# Patient Record
Sex: Female | Born: 1945 | Race: Black or African American | Hispanic: No | Smoking: Former smoker
Health system: Southern US, Community
[De-identification: ages and names within clinical notes are randomized; demographics above are authoritative.]

## PROBLEM LIST (undated history)

## (undated) DIAGNOSIS — M1990 Unspecified osteoarthritis, unspecified site: Secondary | ICD-10-CM

## (undated) DIAGNOSIS — I10 Essential (primary) hypertension: Secondary | ICD-10-CM

## (undated) DIAGNOSIS — H409 Unspecified glaucoma: Secondary | ICD-10-CM

## (undated) DIAGNOSIS — F32A Depression, unspecified: Secondary | ICD-10-CM

## (undated) DIAGNOSIS — G4730 Sleep apnea, unspecified: Secondary | ICD-10-CM

## (undated) DIAGNOSIS — H269 Unspecified cataract: Secondary | ICD-10-CM

## (undated) DIAGNOSIS — K219 Gastro-esophageal reflux disease without esophagitis: Secondary | ICD-10-CM

## (undated) DIAGNOSIS — E785 Hyperlipidemia, unspecified: Secondary | ICD-10-CM

## (undated) DIAGNOSIS — Z5189 Encounter for other specified aftercare: Secondary | ICD-10-CM

## (undated) DIAGNOSIS — N183 Chronic kidney disease, stage 3 (moderate): Secondary | ICD-10-CM

## (undated) DIAGNOSIS — R011 Cardiac murmur, unspecified: Secondary | ICD-10-CM

## (undated) HISTORY — DX: Chronic kidney disease, stage 3 (moderate): N18.3

## (undated) HISTORY — DX: Unspecified cataract: H26.9

## (undated) HISTORY — DX: Unspecified glaucoma: H40.9

## (undated) HISTORY — DX: Depression, unspecified: F32.A

## (undated) HISTORY — DX: Encounter for other specified aftercare: Z51.89

## (undated) HISTORY — DX: Unspecified osteoarthritis, unspecified site: M19.90

## (undated) HISTORY — DX: Cardiac murmur, unspecified: R01.1

## (undated) HISTORY — PX: POLYPECTOMY: SHX149

## (undated) HISTORY — PX: COLONOSCOPY: SHX174

## (undated) HISTORY — PX: CATARACT EXTRACTION, BILATERAL: SHX1313

## (undated) HISTORY — DX: Hyperlipidemia, unspecified: E78.5

## (undated) HISTORY — DX: Gastro-esophageal reflux disease without esophagitis: K21.9

## (undated) HISTORY — PX: ABDOMINAL HYSTERECTOMY: SHX81

---

## 1997-05-23 ENCOUNTER — Encounter (INDEPENDENT_AMBULATORY_CARE_PROVIDER_SITE_OTHER): Payer: Self-pay | Admitting: *Deleted

## 1998-02-06 ENCOUNTER — Encounter: Admission: RE | Admit: 1998-02-06 | Discharge: 1998-02-06 | Payer: Self-pay | Admitting: Family Medicine

## 1998-02-09 ENCOUNTER — Ambulatory Visit: Admission: RE | Admit: 1998-02-09 | Discharge: 1998-02-09 | Payer: Self-pay

## 1998-04-11 ENCOUNTER — Encounter: Admission: RE | Admit: 1998-04-11 | Discharge: 1998-04-11 | Payer: Self-pay | Admitting: Family Medicine

## 1998-05-16 ENCOUNTER — Encounter: Admission: RE | Admit: 1998-05-16 | Discharge: 1998-05-16 | Payer: Self-pay | Admitting: Family Medicine

## 1998-06-13 ENCOUNTER — Encounter: Admission: RE | Admit: 1998-06-13 | Discharge: 1998-06-13 | Payer: Self-pay | Admitting: Family Medicine

## 1998-07-09 ENCOUNTER — Encounter: Admission: RE | Admit: 1998-07-09 | Discharge: 1998-07-09 | Payer: Self-pay | Admitting: Sports Medicine

## 1998-07-09 ENCOUNTER — Ambulatory Visit (HOSPITAL_COMMUNITY): Admission: RE | Admit: 1998-07-09 | Discharge: 1998-07-09 | Payer: Self-pay | Admitting: Sports Medicine

## 1998-10-15 ENCOUNTER — Encounter: Admission: RE | Admit: 1998-10-15 | Discharge: 1998-10-15 | Payer: Self-pay | Admitting: Family Medicine

## 1998-11-21 ENCOUNTER — Encounter: Admission: RE | Admit: 1998-11-21 | Discharge: 1998-11-21 | Payer: Self-pay | Admitting: Family Medicine

## 1998-11-23 ENCOUNTER — Encounter: Admission: RE | Admit: 1998-11-23 | Discharge: 1998-11-23 | Payer: Self-pay | Admitting: Family Medicine

## 1999-03-27 ENCOUNTER — Encounter: Admission: RE | Admit: 1999-03-27 | Discharge: 1999-03-27 | Payer: Self-pay | Admitting: Family Medicine

## 1999-04-03 ENCOUNTER — Encounter: Admission: RE | Admit: 1999-04-03 | Discharge: 1999-04-03 | Payer: Self-pay | Admitting: Family Medicine

## 1999-06-24 ENCOUNTER — Encounter: Admission: RE | Admit: 1999-06-24 | Discharge: 1999-06-24 | Payer: Self-pay | Admitting: Family Medicine

## 1999-08-01 ENCOUNTER — Encounter: Admission: RE | Admit: 1999-08-01 | Discharge: 1999-08-01 | Payer: Self-pay | Admitting: Sports Medicine

## 1999-08-01 ENCOUNTER — Encounter: Payer: Self-pay | Admitting: Sports Medicine

## 1999-10-30 ENCOUNTER — Encounter: Admission: RE | Admit: 1999-10-30 | Discharge: 1999-10-30 | Payer: Self-pay | Admitting: Family Medicine

## 1999-12-04 ENCOUNTER — Encounter: Admission: RE | Admit: 1999-12-04 | Discharge: 1999-12-04 | Payer: Self-pay | Admitting: *Deleted

## 1999-12-04 ENCOUNTER — Encounter: Payer: Self-pay | Admitting: *Deleted

## 1999-12-16 ENCOUNTER — Encounter: Admission: RE | Admit: 1999-12-16 | Discharge: 1999-12-16 | Payer: Self-pay | Admitting: Family Medicine

## 1999-12-23 ENCOUNTER — Encounter: Admission: RE | Admit: 1999-12-23 | Discharge: 2000-03-22 | Payer: Self-pay | Admitting: *Deleted

## 2000-01-27 ENCOUNTER — Encounter (INDEPENDENT_AMBULATORY_CARE_PROVIDER_SITE_OTHER): Payer: Self-pay | Admitting: *Deleted

## 2000-01-27 ENCOUNTER — Ambulatory Visit (HOSPITAL_COMMUNITY): Admission: RE | Admit: 2000-01-27 | Discharge: 2000-01-27 | Payer: Self-pay | Admitting: Gastroenterology

## 2000-03-23 ENCOUNTER — Encounter: Admission: RE | Admit: 2000-03-23 | Discharge: 2000-03-23 | Payer: Self-pay | Admitting: Family Medicine

## 2000-03-23 ENCOUNTER — Ambulatory Visit (HOSPITAL_COMMUNITY): Admission: RE | Admit: 2000-03-23 | Discharge: 2000-03-23 | Payer: Self-pay | Admitting: Family Medicine

## 2000-03-27 ENCOUNTER — Encounter: Payer: Self-pay | Admitting: *Deleted

## 2000-03-27 ENCOUNTER — Ambulatory Visit (HOSPITAL_COMMUNITY): Admission: RE | Admit: 2000-03-27 | Discharge: 2000-03-27 | Payer: Self-pay | Admitting: *Deleted

## 2000-04-03 ENCOUNTER — Encounter: Admission: RE | Admit: 2000-04-03 | Discharge: 2000-04-03 | Payer: Self-pay | Admitting: Internal Medicine

## 2000-04-03 ENCOUNTER — Encounter: Payer: Self-pay | Admitting: Internal Medicine

## 2000-06-11 ENCOUNTER — Encounter: Admission: RE | Admit: 2000-06-11 | Discharge: 2000-06-11 | Payer: Self-pay | Admitting: Family Medicine

## 2000-08-21 ENCOUNTER — Encounter: Admission: RE | Admit: 2000-08-21 | Discharge: 2000-08-21 | Payer: Self-pay | Admitting: Family Medicine

## 2000-10-07 ENCOUNTER — Encounter: Payer: Self-pay | Admitting: Sports Medicine

## 2000-10-07 ENCOUNTER — Encounter: Admission: RE | Admit: 2000-10-07 | Discharge: 2000-10-07 | Payer: Self-pay | Admitting: Sports Medicine

## 2000-11-27 ENCOUNTER — Encounter: Admission: RE | Admit: 2000-11-27 | Discharge: 2000-11-27 | Payer: Self-pay | Admitting: Family Medicine

## 2001-03-26 ENCOUNTER — Encounter: Admission: RE | Admit: 2001-03-26 | Discharge: 2001-03-26 | Payer: Self-pay | Admitting: Family Medicine

## 2001-05-26 ENCOUNTER — Encounter: Admission: RE | Admit: 2001-05-26 | Discharge: 2001-05-26 | Payer: Self-pay | Admitting: Family Medicine

## 2001-10-04 ENCOUNTER — Encounter: Admission: RE | Admit: 2001-10-04 | Discharge: 2001-10-04 | Payer: Self-pay | Admitting: Sports Medicine

## 2001-12-08 ENCOUNTER — Emergency Department (HOSPITAL_COMMUNITY): Admission: EM | Admit: 2001-12-08 | Discharge: 2001-12-08 | Payer: Self-pay | Admitting: *Deleted

## 2001-12-21 ENCOUNTER — Encounter: Admission: RE | Admit: 2001-12-21 | Discharge: 2001-12-24 | Payer: Self-pay | Admitting: Orthopedic Surgery

## 2002-06-22 ENCOUNTER — Encounter (HOSPITAL_BASED_OUTPATIENT_CLINIC_OR_DEPARTMENT_OTHER): Admission: RE | Admit: 2002-06-22 | Discharge: 2002-09-20 | Payer: Self-pay | Admitting: Internal Medicine

## 2002-07-07 ENCOUNTER — Encounter: Admission: RE | Admit: 2002-07-07 | Discharge: 2002-07-07 | Payer: Self-pay | Admitting: Family Medicine

## 2002-09-26 ENCOUNTER — Encounter (HOSPITAL_BASED_OUTPATIENT_CLINIC_OR_DEPARTMENT_OTHER): Admission: RE | Admit: 2002-09-26 | Discharge: 2002-12-25 | Payer: Self-pay | Admitting: Internal Medicine

## 2002-10-06 ENCOUNTER — Encounter: Admission: RE | Admit: 2002-10-06 | Discharge: 2002-10-06 | Payer: Self-pay | Admitting: Family Medicine

## 2002-12-22 ENCOUNTER — Encounter: Admission: RE | Admit: 2002-12-22 | Discharge: 2002-12-22 | Payer: Self-pay | Admitting: Family Medicine

## 2002-12-26 ENCOUNTER — Encounter: Admission: RE | Admit: 2002-12-26 | Discharge: 2002-12-26 | Payer: Self-pay | Admitting: Family Medicine

## 2003-01-10 ENCOUNTER — Ambulatory Visit (HOSPITAL_COMMUNITY): Admission: RE | Admit: 2003-01-10 | Discharge: 2003-01-10 | Payer: Self-pay | Admitting: Family Medicine

## 2003-01-10 ENCOUNTER — Encounter: Admission: RE | Admit: 2003-01-10 | Discharge: 2003-01-10 | Payer: Self-pay | Admitting: Family Medicine

## 2003-03-08 ENCOUNTER — Encounter: Admission: RE | Admit: 2003-03-08 | Discharge: 2003-03-08 | Payer: Self-pay | Admitting: Family Medicine

## 2003-03-28 ENCOUNTER — Ambulatory Visit (HOSPITAL_COMMUNITY): Admission: RE | Admit: 2003-03-28 | Discharge: 2003-03-28 | Payer: Self-pay | Admitting: Family Medicine

## 2003-07-05 ENCOUNTER — Encounter: Admission: RE | Admit: 2003-07-05 | Discharge: 2003-07-05 | Payer: Self-pay | Admitting: Family Medicine

## 2003-11-17 ENCOUNTER — Encounter: Admission: RE | Admit: 2003-11-17 | Discharge: 2003-11-17 | Payer: Self-pay | Admitting: Family Medicine

## 2004-03-28 ENCOUNTER — Ambulatory Visit (HOSPITAL_COMMUNITY): Admission: RE | Admit: 2004-03-28 | Discharge: 2004-03-28 | Payer: Self-pay | Admitting: Family Medicine

## 2004-05-02 ENCOUNTER — Encounter: Admission: RE | Admit: 2004-05-02 | Discharge: 2004-05-02 | Payer: Self-pay | Admitting: Family Medicine

## 2004-07-15 ENCOUNTER — Ambulatory Visit: Payer: Self-pay | Admitting: Family Medicine

## 2004-07-15 ENCOUNTER — Inpatient Hospital Stay (HOSPITAL_COMMUNITY): Admission: EM | Admit: 2004-07-15 | Discharge: 2004-07-17 | Payer: Self-pay | Admitting: Family Medicine

## 2004-07-17 ENCOUNTER — Encounter: Payer: Self-pay | Admitting: Cardiology

## 2004-07-23 ENCOUNTER — Ambulatory Visit: Payer: Self-pay | Admitting: Sports Medicine

## 2004-08-12 ENCOUNTER — Ambulatory Visit: Payer: Self-pay | Admitting: Family Medicine

## 2004-08-12 ENCOUNTER — Ambulatory Visit (HOSPITAL_COMMUNITY): Admission: RE | Admit: 2004-08-12 | Discharge: 2004-08-12 | Payer: Self-pay | Admitting: Family Medicine

## 2004-11-20 ENCOUNTER — Ambulatory Visit: Payer: Self-pay | Admitting: Family Medicine

## 2004-12-22 ENCOUNTER — Emergency Department (HOSPITAL_COMMUNITY): Admission: EM | Admit: 2004-12-22 | Discharge: 2004-12-22 | Payer: Self-pay | Admitting: Emergency Medicine

## 2005-03-28 ENCOUNTER — Ambulatory Visit: Payer: Self-pay | Admitting: Family Medicine

## 2005-04-22 ENCOUNTER — Ambulatory Visit (HOSPITAL_COMMUNITY): Admission: RE | Admit: 2005-04-22 | Discharge: 2005-04-22 | Payer: Self-pay

## 2005-06-24 ENCOUNTER — Ambulatory Visit: Payer: Self-pay | Admitting: Family Medicine

## 2005-07-14 ENCOUNTER — Ambulatory Visit: Payer: Self-pay | Admitting: Family Medicine

## 2005-08-26 ENCOUNTER — Emergency Department (HOSPITAL_COMMUNITY): Admission: EM | Admit: 2005-08-26 | Discharge: 2005-08-26 | Payer: Self-pay | Admitting: Emergency Medicine

## 2005-09-18 ENCOUNTER — Emergency Department (HOSPITAL_COMMUNITY): Admission: EM | Admit: 2005-09-18 | Discharge: 2005-09-18 | Payer: Self-pay | Admitting: Emergency Medicine

## 2005-12-31 ENCOUNTER — Ambulatory Visit: Payer: Self-pay | Admitting: Family Medicine

## 2006-04-24 ENCOUNTER — Ambulatory Visit (HOSPITAL_COMMUNITY): Admission: RE | Admit: 2006-04-24 | Discharge: 2006-04-24 | Payer: Self-pay | Admitting: Family Medicine

## 2006-05-26 ENCOUNTER — Ambulatory Visit: Payer: Self-pay | Admitting: Sports Medicine

## 2006-09-23 ENCOUNTER — Ambulatory Visit: Payer: Self-pay | Admitting: Family Medicine

## 2006-11-19 DIAGNOSIS — H269 Unspecified cataract: Secondary | ICD-10-CM

## 2006-11-19 DIAGNOSIS — H409 Unspecified glaucoma: Secondary | ICD-10-CM | POA: Insufficient documentation

## 2006-11-19 DIAGNOSIS — Z794 Long term (current) use of insulin: Secondary | ICD-10-CM

## 2006-11-19 DIAGNOSIS — E669 Obesity, unspecified: Secondary | ICD-10-CM

## 2006-11-19 DIAGNOSIS — I10 Essential (primary) hypertension: Secondary | ICD-10-CM

## 2006-11-19 DIAGNOSIS — R809 Proteinuria, unspecified: Secondary | ICD-10-CM

## 2006-11-19 DIAGNOSIS — E1140 Type 2 diabetes mellitus with diabetic neuropathy, unspecified: Secondary | ICD-10-CM

## 2006-11-19 DIAGNOSIS — K219 Gastro-esophageal reflux disease without esophagitis: Secondary | ICD-10-CM

## 2006-11-19 DIAGNOSIS — K5730 Diverticulosis of large intestine without perforation or abscess without bleeding: Secondary | ICD-10-CM | POA: Insufficient documentation

## 2006-11-20 ENCOUNTER — Encounter (INDEPENDENT_AMBULATORY_CARE_PROVIDER_SITE_OTHER): Payer: Self-pay | Admitting: *Deleted

## 2006-12-17 ENCOUNTER — Telehealth (INDEPENDENT_AMBULATORY_CARE_PROVIDER_SITE_OTHER): Payer: Self-pay | Admitting: Family Medicine

## 2007-01-05 ENCOUNTER — Telehealth: Payer: Self-pay | Admitting: *Deleted

## 2007-01-06 ENCOUNTER — Telehealth (INDEPENDENT_AMBULATORY_CARE_PROVIDER_SITE_OTHER): Payer: Self-pay | Admitting: *Deleted

## 2007-01-21 ENCOUNTER — Encounter (INDEPENDENT_AMBULATORY_CARE_PROVIDER_SITE_OTHER): Payer: Self-pay | Admitting: Family Medicine

## 2007-01-21 ENCOUNTER — Ambulatory Visit: Payer: Self-pay | Admitting: Sports Medicine

## 2007-01-21 DIAGNOSIS — D126 Benign neoplasm of colon, unspecified: Secondary | ICD-10-CM

## 2007-01-21 LAB — CONVERTED CEMR LAB
HCT: 36.2 %
MCV: 83.9 fL
Platelets: 268 10*3/uL
RBC: 4.32 M/uL

## 2007-01-22 ENCOUNTER — Encounter (INDEPENDENT_AMBULATORY_CARE_PROVIDER_SITE_OTHER): Payer: Self-pay | Admitting: Family Medicine

## 2007-01-22 LAB — CONVERTED CEMR LAB
ALT: 10 units/L (ref 0–35)
AST: 11 units/L (ref 0–37)
Alkaline Phosphatase: 84 units/L (ref 39–117)
Calcium: 9.4 mg/dL (ref 8.4–10.5)
Chloride: 104 meq/L (ref 96–112)
Creatinine, Ser: 0.71 mg/dL (ref 0.40–1.20)
LDL Cholesterol: 85 mg/dL (ref 0–99)
Potassium: 3.9 meq/L (ref 3.5–5.3)
Total CHOL/HDL Ratio: 3.5

## 2007-02-02 ENCOUNTER — Encounter (INDEPENDENT_AMBULATORY_CARE_PROVIDER_SITE_OTHER): Payer: Self-pay | Admitting: Family Medicine

## 2007-02-17 ENCOUNTER — Telehealth: Payer: Self-pay | Admitting: *Deleted

## 2007-03-01 ENCOUNTER — Telehealth: Payer: Self-pay | Admitting: *Deleted

## 2007-05-12 ENCOUNTER — Ambulatory Visit (HOSPITAL_COMMUNITY): Admission: RE | Admit: 2007-05-12 | Discharge: 2007-05-12 | Payer: Self-pay | Admitting: Family Medicine

## 2007-09-20 ENCOUNTER — Emergency Department (HOSPITAL_COMMUNITY): Admission: EM | Admit: 2007-09-20 | Discharge: 2007-09-20 | Payer: Self-pay | Admitting: Family Medicine

## 2007-10-18 ENCOUNTER — Telehealth (INDEPENDENT_AMBULATORY_CARE_PROVIDER_SITE_OTHER): Payer: Self-pay | Admitting: *Deleted

## 2007-10-19 ENCOUNTER — Ambulatory Visit: Payer: Self-pay | Admitting: Family Medicine

## 2007-12-29 ENCOUNTER — Ambulatory Visit: Payer: Self-pay | Admitting: Family Medicine

## 2007-12-29 ENCOUNTER — Encounter (INDEPENDENT_AMBULATORY_CARE_PROVIDER_SITE_OTHER): Payer: Self-pay | Admitting: Family Medicine

## 2007-12-29 LAB — CONVERTED CEMR LAB
ALT: 15 units/L (ref 0–35)
AST: 13 units/L (ref 0–37)
Alkaline Phosphatase: 107 units/L (ref 39–117)
CO2: 25 meq/L (ref 19–32)
Creatinine, Ser: 0.76 mg/dL (ref 0.40–1.20)
Hgb A1c MFr Bld: 11.2 %
Sodium: 142 meq/L (ref 135–145)
Total Bilirubin: 0.3 mg/dL (ref 0.3–1.2)
Total Protein: 7.5 g/dL (ref 6.0–8.3)

## 2008-01-03 ENCOUNTER — Telehealth (INDEPENDENT_AMBULATORY_CARE_PROVIDER_SITE_OTHER): Payer: Self-pay | Admitting: *Deleted

## 2008-01-12 ENCOUNTER — Telehealth (INDEPENDENT_AMBULATORY_CARE_PROVIDER_SITE_OTHER): Payer: Self-pay | Admitting: *Deleted

## 2008-01-17 ENCOUNTER — Telehealth: Payer: Self-pay | Admitting: Gastroenterology

## 2008-01-29 DIAGNOSIS — B009 Herpesviral infection, unspecified: Secondary | ICD-10-CM | POA: Insufficient documentation

## 2008-02-01 ENCOUNTER — Ambulatory Visit: Payer: Self-pay | Admitting: Gastroenterology

## 2008-02-01 DIAGNOSIS — R1319 Other dysphagia: Secondary | ICD-10-CM

## 2008-02-07 ENCOUNTER — Telehealth: Payer: Self-pay | Admitting: Gastroenterology

## 2008-03-28 ENCOUNTER — Ambulatory Visit: Payer: Self-pay | Admitting: Gastroenterology

## 2008-03-28 ENCOUNTER — Encounter: Payer: Self-pay | Admitting: Gastroenterology

## 2008-03-30 ENCOUNTER — Encounter (INDEPENDENT_AMBULATORY_CARE_PROVIDER_SITE_OTHER): Payer: Self-pay | Admitting: Family Medicine

## 2008-03-30 ENCOUNTER — Encounter: Payer: Self-pay | Admitting: Gastroenterology

## 2008-03-30 ENCOUNTER — Ambulatory Visit: Payer: Self-pay | Admitting: Family Medicine

## 2008-03-30 LAB — CONVERTED CEMR LAB: Hgb A1c MFr Bld: 12.5 %

## 2008-03-31 ENCOUNTER — Telehealth: Payer: Self-pay | Admitting: Gastroenterology

## 2008-03-31 ENCOUNTER — Encounter (INDEPENDENT_AMBULATORY_CARE_PROVIDER_SITE_OTHER): Payer: Self-pay | Admitting: Family Medicine

## 2008-04-06 ENCOUNTER — Telehealth (INDEPENDENT_AMBULATORY_CARE_PROVIDER_SITE_OTHER): Payer: Self-pay | Admitting: *Deleted

## 2008-05-24 ENCOUNTER — Ambulatory Visit: Payer: Self-pay | Admitting: Gastroenterology

## 2008-06-20 ENCOUNTER — Ambulatory Visit: Payer: Self-pay | Admitting: Family Medicine

## 2008-06-20 LAB — CONVERTED CEMR LAB

## 2008-06-28 ENCOUNTER — Ambulatory Visit (HOSPITAL_COMMUNITY): Admission: RE | Admit: 2008-06-28 | Discharge: 2008-06-28 | Payer: Self-pay | Admitting: Family Medicine

## 2008-06-29 ENCOUNTER — Ambulatory Visit: Payer: Self-pay | Admitting: Family Medicine

## 2008-09-25 ENCOUNTER — Ambulatory Visit: Payer: Self-pay | Admitting: Family Medicine

## 2008-09-25 ENCOUNTER — Telehealth: Payer: Self-pay | Admitting: *Deleted

## 2008-09-25 LAB — CONVERTED CEMR LAB: Whiff Test: NEGATIVE

## 2008-10-25 ENCOUNTER — Ambulatory Visit: Payer: Self-pay | Admitting: Family Medicine

## 2008-10-25 DIAGNOSIS — N952 Postmenopausal atrophic vaginitis: Secondary | ICD-10-CM

## 2008-10-25 LAB — CONVERTED CEMR LAB: Hgb A1c MFr Bld: 13.4 %

## 2009-02-26 ENCOUNTER — Ambulatory Visit: Payer: Self-pay | Admitting: Family Medicine

## 2009-02-26 ENCOUNTER — Encounter (INDEPENDENT_AMBULATORY_CARE_PROVIDER_SITE_OTHER): Payer: Self-pay | Admitting: Family Medicine

## 2009-02-26 LAB — CONVERTED CEMR LAB
ALT: 15 units/L (ref 0–35)
AST: 17 units/L (ref 0–37)
BUN: 12 mg/dL (ref 6–23)
Calcium: 9.3 mg/dL (ref 8.4–10.5)
Creatinine, Ser: 0.77 mg/dL (ref 0.40–1.20)
HDL: 55 mg/dL (ref >39)
Hgb A1c MFr Bld: 13 %
Total Bilirubin: 0.3 mg/dL (ref 0.3–1.2)
Total CHOL/HDL Ratio: 3.8
VLDL: 75 mg/dL — ABNORMAL HIGH (ref 0–40)

## 2009-02-27 ENCOUNTER — Encounter (INDEPENDENT_AMBULATORY_CARE_PROVIDER_SITE_OTHER): Payer: Self-pay | Admitting: Family Medicine

## 2009-02-27 ENCOUNTER — Telehealth (INDEPENDENT_AMBULATORY_CARE_PROVIDER_SITE_OTHER): Payer: Self-pay | Admitting: *Deleted

## 2009-03-19 ENCOUNTER — Encounter (INDEPENDENT_AMBULATORY_CARE_PROVIDER_SITE_OTHER): Payer: Self-pay | Admitting: Family Medicine

## 2009-05-31 ENCOUNTER — Ambulatory Visit: Payer: Self-pay | Admitting: Family Medicine

## 2009-05-31 LAB — CONVERTED CEMR LAB: Hgb A1c MFr Bld: 12.3 %

## 2009-06-09 ENCOUNTER — Encounter (INDEPENDENT_AMBULATORY_CARE_PROVIDER_SITE_OTHER): Payer: Self-pay | Admitting: *Deleted

## 2009-06-09 DIAGNOSIS — Z87891 Personal history of nicotine dependence: Secondary | ICD-10-CM

## 2009-07-16 ENCOUNTER — Ambulatory Visit (HOSPITAL_COMMUNITY): Admission: RE | Admit: 2009-07-16 | Discharge: 2009-07-16 | Payer: Self-pay | Admitting: Family Medicine

## 2009-07-19 ENCOUNTER — Telehealth: Payer: Self-pay | Admitting: *Deleted

## 2009-07-24 ENCOUNTER — Telehealth: Payer: Self-pay | Admitting: *Deleted

## 2009-08-02 ENCOUNTER — Telehealth: Payer: Self-pay | Admitting: *Deleted

## 2009-09-26 ENCOUNTER — Encounter: Payer: Self-pay | Admitting: Family Medicine

## 2009-09-26 ENCOUNTER — Ambulatory Visit: Payer: Self-pay | Admitting: Family Medicine

## 2009-09-26 LAB — CONVERTED CEMR LAB
Albumin: 4.1 g/dL (ref 3.5–5.2)
Alkaline Phosphatase: 86 units/L (ref 39–117)
CO2: 25 meq/L (ref 19–32)
Chloride: 104 meq/L (ref 96–112)
Hgb A1c MFr Bld: 12 %
Total Protein: 6.6 g/dL (ref 6.0–8.3)

## 2009-12-31 ENCOUNTER — Encounter: Payer: Self-pay | Admitting: Family Medicine

## 2009-12-31 ENCOUNTER — Ambulatory Visit: Payer: Self-pay | Admitting: Family Medicine

## 2009-12-31 DIAGNOSIS — I739 Peripheral vascular disease, unspecified: Secondary | ICD-10-CM

## 2009-12-31 LAB — CONVERTED CEMR LAB: TSH: 1.29 microintl units/mL (ref 0.350–4.500)

## 2010-01-25 ENCOUNTER — Ambulatory Visit: Payer: Self-pay | Admitting: Family Medicine

## 2010-04-07 ENCOUNTER — Emergency Department (HOSPITAL_COMMUNITY): Admission: EM | Admit: 2010-04-07 | Discharge: 2010-04-07 | Payer: Self-pay | Admitting: Family Medicine

## 2010-04-09 ENCOUNTER — Ambulatory Visit: Payer: Self-pay | Admitting: Family Medicine

## 2010-04-22 ENCOUNTER — Ambulatory Visit: Payer: Self-pay | Admitting: Family Medicine

## 2010-07-17 ENCOUNTER — Ambulatory Visit (HOSPITAL_COMMUNITY): Admission: RE | Admit: 2010-07-17 | Discharge: 2010-07-17 | Payer: Self-pay | Admitting: Family Medicine

## 2010-07-23 ENCOUNTER — Encounter: Payer: Self-pay | Admitting: Family Medicine

## 2010-08-24 ENCOUNTER — Emergency Department (HOSPITAL_COMMUNITY)
Admission: EM | Admit: 2010-08-24 | Discharge: 2010-08-24 | Payer: Self-pay | Source: Home / Self Care | Admitting: Emergency Medicine

## 2010-09-03 ENCOUNTER — Ambulatory Visit: Payer: Self-pay | Admitting: Family Medicine

## 2010-09-17 ENCOUNTER — Encounter: Payer: Self-pay | Admitting: Family Medicine

## 2010-10-04 ENCOUNTER — Ambulatory Visit: Admission: RE | Admit: 2010-10-04 | Discharge: 2010-10-04 | Payer: Self-pay | Source: Home / Self Care

## 2010-10-14 ENCOUNTER — Encounter: Payer: Self-pay | Admitting: Family Medicine

## 2010-10-22 ENCOUNTER — Ambulatory Visit: Admission: RE | Admit: 2010-10-22 | Discharge: 2010-10-22 | Payer: Self-pay | Source: Home / Self Care

## 2010-10-22 NOTE — Assessment & Plan Note (Signed)
Summary: f/up,tcb   Vital Signs:  Patient profile:   65 year old female Height:      61 inches Weight:      167.2 pounds BMI:     31.71 Temp:     98.3 degrees F oral Pulse rate:   84 / minute BP sitting:   166 / 83  (left arm) Cuff size:   regular  Vitals Entered By: Levert Feinstein LPN (April 11, [id number] AM) CC: f/u dm, HTN, pain in legs, crying spells Is Patient Diabetic? Yes Did you bring your meter with you today? No Pain Assessment Patient in pain? no        Primary Care Provider:  Patria Mane  MD  CC:  f/u dm, HTN, pain in legs, and crying spells.  History of Present Illness: DM: taking lantus 60u QAM.  also taking her metformin but taking just 1000mg  (2tabs) once daily at about 1pm - this causes her to have diarrhea.  she hasn't increased her metformin further.  now checking her sugars per her report QAM. highes she has seen was about 260, lowest 92.  denies feeling bad with the 92.  HTN: has noticed despite taking her accuretic 2 tabs daily that her blood pressure is much higher than it used to be on the separate medications.  at home she is also getting 160s/80s.  she would like to switch back to her old separate medications as she feels they helped her more.  she denies leg swelling, chest pain or shortness of breath.    pain in legs: has been going on partiuclarly badly for the past few months.  she has noticed that when she walks as little as 2 blocks her thighs and legs are painful.  she also tried taking an exercise class and had to quit 2/2 leg pain.  sitting after this helps  and makes the pain go away.  she has also noticed some pain under the toes on the R foot. she hasn't tried anything to try to help this.  she denies any sores of her legs.  crying spells: has been going on for >29yr but has worsened in the last month or so.  unsure why.  cries for "no reason" at least daily.  doesn't remember any of this prior to about 1 yr ago.  she denies what she thinks are  hormonal changes - did have ovary left after hysterectomy but that was over 14yrs ago.  actually states she is sleeping well, doing the things she likes, has no quild but energy is poor, she is easily forgetful.  appetite is good, denies psychomotor agitation.  denies suicidal ideation.  she doesn't think she is depressed but she isn't sure.  denies mood disorder in the family.   Habits & Providers  Alcohol-Tobacco-Diet     Tobacco Status: never  Current Medications (verified): 1)  Lisinopril 40 Mg Tabs (Lisinopril) .Marland Kitchen.. 1 By Mouth Once Daily For Blood Pressure. 2)  Ascensia Elite Test   Strp (Glucose Blood) .... Please Give Pt Glucometer Test Strips, Test Sugar Once Daily 3)  Lantus 100 Unit/ml Soln (Insulin Glargine) .... 60 Units Daily 4)  Glucose Test Strips and Lancets .... For Patient's Current Meter.  Testing 1x Daily. Disp 1 Supply. 5)  Metformin Hcl 500 Mg Tabs (Metformin Hcl) .... Titrate Up To Goal 1 Tablet Three Times Daily For Diabetes 6)  Citalopram Hydrobromide 40 Mg Tabs (Citalopram Hydrobromide) .... Titrate Up As Directed To Goal 1 Tablet Daily.  7)  Hydrochlorothiazide 25 Mg Tabs (Hydrochlorothiazide) .Marland Kitchen.. 1 By Mouth Once Daily For Blood Pressure  Allergies (verified): No Known Drug Allergies  Past History:  Past medical, surgical, family and social histories (including risk factors) reviewed for relevance to current acute and chronic problems.  Past Medical History: H Pylori treated 03/00, 7/09 h/o hemorrhoids HSV Post-menopausal vulvar and vaginal atrophy renal ultrasound wnl - 11/21/1999 2D echo- EF 55%, akinesis, mild MR - 07/19/2004 Cardiac Cath: min-> no CAD; nl LV fx - 04/14/2000 Diabetes- insulin started 05/2008 Glaucoma- not on any eye drops Hypertension hyperplastic colon polyps (Dr. Watt Climes colonoscopy 2001 & again 03/2008 by Dr. Vaughan Basta) EGD (7/09)- gastritis and h. pylori  Past Surgical History: Hysterectomy, ?bilateral or unilateral oophorectomy 1981    Family History: Reviewed history from 01/21/2007 and no changes required. DM, htn, Mom died of CAD at 106 No FH colon cancer  Social History: Reviewed history from 02/26/2009 and no changes required. Lives with daughter, grandson Daymon Larsen); single; not dating currently and not sexually active; smokes 1/2 ppd;  Social EtOH, beer on weekends.  Obtains meds at [facility].  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  does tear up randomly during visit Head:  Normocephalic and atraumatic without obvious abnormalities. No apparent alopecia or balding. Lungs:  Normal respiratory effort, chest expands symmetrically. Lungs are clear to auscultation, no crackles or wheezes. Heart:  Normal rate and regular rhythm. S1 and S2 normal without gallop, murmur, click, rub or other extra sounds. Abdomen:  Bowel sounds positive,abdomen soft and non-tender without masses, organomegaly or hernias noted. Pulses:  unable to feel posterior tibial pulses bilaterally.  1+ DP pulses bilaterally.  no open sores noted on feet.  warm to touch.  good cap refill.  Psych:  spontaneously tearful  Diabetes Management Exam:    Foot Exam (with socks and/or shoes not present):       Sensory-Pinprick/Light touch:          Left medial foot (L-4): normal          Left dorsal foot (L-5): normal          Left lateral foot (S-1): normal          Right medial foot (L-4): normal          Right dorsal foot (L-5): normal          Right lateral foot (S-1): normal       Sensory-Monofilament:          Left foot: normal          Right foot: normal       Inspection:          Left foot: normal          Right foot: normal       Nails:          Left foot: normal          Right foot: normal   Geriatric Assessment:  Mental  Status Exam: (value/max value)    Orientation to Time: 5/5    Orientation to Place: 5/5    Registration: 3/3    Attention/Calculation: 3/5    Recall: 3/3    Language-name 2 objects: 2/2    Language-repeat: 1/1    Language-follow 3-step command: 3/3  Language-read and follow direction: 1/1    Write a sentence: 1/1    Copy design: 1/1    spelled world backwards as dlorw MSE Total score: 28/30   Impression & Recommendations:  Problem # 1:  DIABETES MELLITUS II, UNCOMPLICATED (XX123456) Assessment Improved  much improved.  try to increase metformin to see if we can get tighter control.  no change in lantus at this time based on reported AM CBG of 92.   Her updated medication list for this problem includes:    Lisinopril 40 Mg Tabs (Lisinopril) .Marland Kitchen... 1 by mouth once daily for blood pressure.    Lantus 100 Unit/ml Soln (Insulin glargine) .Marland KitchenMarland KitchenMarland KitchenMarland Kitchen 60 units daily    Metformin Hcl 500 Mg Tabs (Metformin hcl) .Marland Kitchen... Titrate up to goal 1 tablet three times daily for diabetes  Orders: A1C-[facility] [id number]) TSH-[facility] [id number]) [place]- Est  Level 4 [id number])  Labs Reviewed: Creat: 0.80 (09/26/2009)     Last Eye Exam: per patient report was normal (04/22/2009) Reviewed HgBA1c results: 8.6 (12/31/2009)  12.0 (09/26/2009)  Problem # 2:  CLAUDICATION (ICD-443.9) Assessment: New get ABIs.  start ASA.  continue risk factor modification - consider statin at next visit.  Orders: [facility]- Est  Level 4 (99214)  Problem # 3:  EXCESSIVE CRYING OF CHILD ADOLESCENT OR ADULT (ICD-780.95) Assessment: New concerned about depression.  discussed options - counseling, medication, both, neither.  she would like a trial of an antidepressant.  f/u 1 month to see how crying spells are doing.  check TSH to make sure not hormonal problem   Orders: [facility]- Est  Level 4 [id number])  Problem # 4:  HYPERTENSION, BENIGN SYSTEMIC (ICD-401.1) Assessment: Deteriorated change back to old separted meds as she did in fact have  better control at that time.  continue home monitoring.  tweak as necessary Her updated medication list for this problem includes:    Lisinopril 40 Mg Tabs (Lisinopril) .Marland Kitchen... 1 by mouth once daily for blood pressure.    Hydrochlorothiazide 25 Mg Tabs (Hydrochlorothiazide) .Marland Kitchen... 1 by mouth once daily for blood pressure  Orders: [facility]- Est  Level 4 (99214)  BP today: 166/83 Prior BP: 147/81 (09/26/2009)  Labs Reviewed: K+: 4.2 (09/26/2009) Creat: : 0.80 (09/26/2009)   Chol: 208 (02/26/2009)   HDL: 55 (02/26/2009)   LDL: 78 (02/26/2009)   TG: 376 (02/26/2009)  Complete Medication List: 1)  Lisinopril 40 Mg Tabs (Lisinopril) .Marland Kitchen.. 1 by mouth once daily for blood pressure. 2)  Ascensia Elite Test Strp (Glucose blood) .... Please give pt glucometer test strips, test sugar once daily 3)  Lantus 100 Unit/ml Soln (Insulin glargine) .... 60 units daily 4)  Glucose Test Strips and Lancets  .... For patient's current meter.  testing 1x daily. disp 1 supply. 5)  Metformin Hcl 500 Mg Tabs (Metformin hcl) .... Titrate up to goal 1 tablet three times daily for diabetes 6)  Citalopram Hydrobromide 40 Mg Tabs (Citalopram hydrobromide) .... Titrate up as directed to goal 1 tablet daily. 7)  Hydrochlorothiazide 25 Mg Tabs (Hydrochlorothiazide) .Marland Kitchen.. 1 by mouth once daily for blood pressure  Patient Instructions: 1)  Please follow up with me in about 4 weeks to check on your crying spells. 2)  Please schedule an appointment in pharmacy clinic for ABIs to evaluate for peripheral vascular disease (ie: poor circulation). 3)  Start taking metformin - 1 tab at breakfast and 1 tab at dinner.  If this is going okay after 1 week then take 1 tab at breakfast, 1 tab  at lunch and 1 tab at dinner.   4)  Start taking a baby aspirin daily.  5)  Start the new mood medication - called citalopram.  take 1/2 tablet daily for 1 week then 1 tablet daily thereafter.  6)  When you follow up we will talk about starting a medicine to  help your cholesterol as well.  Prescriptions: HYDROCHLOROTHIAZIDE 25 MG TABS (HYDROCHLOROTHIAZIDE) 1 by mouth once daily for blood pressure  #32 x 6   Entered and Authorized by:   Patria Mane  MD   Signed by:   Patria Mane  MD on 12/31/2009   Method used:   Faxed to ...       [facility] (retail)       [place], [place]  [address]       Ph: [phone]       Fax: [phone]   RxID:   [id number] LISINOPRIL 40 MG TABS (LISINOPRIL) 1 by mouth once daily for blood pressure.  #32 x 6   Entered and Authorized by:   Patria Mane  MD   Signed by:   Patria Mane  MD on 12/31/2009   Method used:   Faxed to ...       [facility] (retail)       [place], [place]  [address]       Ph: [phone]       Fax: [phone]   RxID:   [id number] METFORMIN HCL 500 MG TABS (METFORMIN HCL) titrate up to goal 1 tablet three times daily for diabetes  #90 x 6   Entered and Authorized by:   Patria Mane  MD   Signed by:   Patria Mane  MD on 12/31/2009   Method used:   Faxed to ...       [facility] (retail)       [place], [place]  [address]       Ph: [phone]       Fax: [phone]   RxID:   [id number] CITALOPRAM HYDROBROMIDE 40 MG TABS (CITALOPRAM HYDROBROMIDE) titrate up as directed to goal 1 tablet daily.  #32 x 3   Entered and Authorized by:   Patria Mane  MD   Signed by:   Patria Mane  MD on 12/31/2009   Method used:   Faxed to ...       [facility] (retail)       [address] [place],   [address]       Ph: [phone]       Fax: [phone]   RxID:   [id number]   Laboratory Results   Blood Tests   Date/Time Received: December 31, 2009 10:31 AM  Date/Time Reported: December 31, 2009 10:45 AM   HGBA1C: 8.6%   (Normal Range: Non-Diabetic - 3-6%   Control Diabetic - 6-8%)  Comments:  ...........test performed by...........Marland KitchenLevert Feinstein, LPN entered by Hedy Camara, CMA        Prevention & Chronic Care Immunizations   Influenza vaccine: Fluvax Non-MCR  (06/29/2008)   Influenza vaccine due: 06/29/2009    Tetanus booster: 12/29/2007: given   Tetanus booster due: 12/28/2017    Pneumococcal vaccine: Done.  (10/23/1997)   Pneumococcal vaccine due: None    H. zoster vaccine: Not documented  Colorectal Screening   Hemoccult: Done.  (06/22/2005)   Hemoccult due: Not Indicated    Colonoscopy: hyperplastic polyps, serrated adenoma without neoplasm or dysplasia.  EGD showed gastritis and h. pylori  (03/28/2008)   Colonoscopy due: 03/28/2018  Other Screening   Pap smear: Done.  (05/23/1997)   Pap smear due: Not Indicated    Mammogram: ASSESSMENT: Negative - BI-RADS 1^MM DIGITAL SCREENING  (07/16/2009)   Mammogram due: 07/16/2010    DXA bone density scan: Not documented   Smoking status: never  (12/31/2009)  Diabetes Mellitus   HgbA1C: 8.6  (12/31/2009)   Hemoglobin A1C due: 09/19/2008    Eye exam: per patient report was normal  (04/22/2009)    Foot exam: yes  (12/31/2009)   High risk foot: Not documented   Foot care education: Not documented   Foot exam due: 03/30/2009    Urine microalbumin/creatinine ratio: Not documented    Diabetes flowsheet reviewed?: Yes   Progress toward A1C goal: Improved  Lipids   Total Cholesterol: 208  (02/26/2009)   LDL: 78  (02/26/2009)   LDL Direct: 85  (03/30/2008)   HDL: 55  (02/26/2009)   Triglycerides: 376  (02/26/2009)  Hypertension   Last Blood Pressure: 166 / 83  (12/31/2009)   Serum creatinine: 0.80  (09/26/2009)   Serum potassium 4.2  (09/26/2009)    Hypertension flowsheet reviewed?: Yes   Progress toward BP goal: Deteriorated  Self-Management Support :   Personal Goals (by the next clinic visit) :     Personal A1C goal: 8  (05/31/2009)     Personal blood pressure goal: 130/80  (05/31/2009)      Personal LDL goal: 100  (05/31/2009)    Diabetes self-management support: Copy of home glucose meter record, CBG self-monitoring log, Written self-care plan  (09/26/2009)    Diabetes self-management support not done because: Good outcomes  (12/31/2009)    Hypertension self-management support: BP self-monitoring log  (12/31/2009)    Hypertension self-management support not done because: Not indicated  (12/31/2009)    Self-management comments: patient already monitoring HTN - thinks worsening is 2/2 med changes - will change back to what she was well controlled on.

## 2010-10-22 NOTE — Assessment & Plan Note (Signed)
Summary: f/up,tcb   Vital Signs:  Patient profile:   65 year old female Weight:      165.7 pounds Temp:     98.7 degrees F oral Pulse rate:   85 / minute BP sitting:   147 / 81  (right arm) Cuff size:   regular  Vitals Entered By: Audelia Hives CMA (September 26, 2009 10:26 AM)  Primary Care Provider:  Patria Mane  MD  CC:  f/u DM and f/u HTN.  abrasion on buttock.  History of Present Illness: DM: not always taking her lantus because she says health department wasn't getting med for a while.  she was trying to stretch it out thus taking irregularly.  sugars when checked (rarely - only about 10 times in december) primarily in 200s but with 2 numbers in 400-500 range.  denies any lows.  did have eye exam in 04/2009 which she states was normal. checking her feet regularly.  HTN: tkaing meds.  usually at home she states running in 110s/70s.  denies side effects to meds - no dizziness, no chest pain, no SOB, no peripheral edema.   abrasion: has recurrent sores open on buttock which are tender/raw.  they eventually clear up on their own.  she is often using ABX ointment on them.  never drain. current one on left buttock has been there about 2 weeks.   Diabetic Eye Exam  Procedure date:  04/22/2009  Findings:      per patient report was normal  Current Medications (verified): 1)  Accuretic 20-12.5 Mg Tabs (Quinapril-Hydrochlorothiazide) .... 2 By Mouth Once Daily For Blood Pressure.  Replaces Old Prescription. 2)  Ascensia Elite Test   Strp (Glucose Blood) .... Please Give Pt Glucometer Test Strips, Test Sugar Once Daily 3)  Lantus 100 Unit/ml Soln (Insulin Glargine) .... Inject Nightly To Titrate Up To Goal Fasting Sugars of Less Than 200.  (Estimate It Will Take Daily Is 60 Units).  Disp 1 Mo Supply. 4)  Glucose Test Strips and Lancets .... For Patient's Current Meter.  Testing 1x Daily. Disp 1 Supply. 5)  Metformin Hcl 500 Mg Tabs (Metformin Hcl) .... Titrate Up As Directed To Goal 2  Tablets Twice Daily  Allergies (verified): No Known Drug Allergies  Past History:  Past medical, surgical, family and social histories (including risk factors) reviewed for relevance to current acute and chronic problems.  Past Medical History: Reviewed history from 03/19/2009 and no changes required. H Pylori treated 03/00, 7/09 h/o hemorrhoids HSV Post-menopausal vulvar and vaginal atrophy renal ultrasound wnl - 11/21/1999 ETT 10/99- no abnormal ST depression during exercise or recovery; low probability for sig. CAD or ischemia 2D echo- EF 55%, akinesis, mild MR - 07/19/2004 Cardiac Cath: min-> no CAD; nl LV fx - 04/14/2000 Diabetes- insulin started 05/2008 Glaucoma- not on any eye drops Hypertension hyperplastic colon polyps (Dr. Watt Climes colonoscopy 2001 & again 03/2008 by Dr. Vaughan Basta) EGD (7/09)- gastritis and h. pylori  Past Surgical History: Reviewed history from 03/30/2008 and no changes required. Hysterectomy, bilateral oophorectomy 1981   Family History: Reviewed history from 01/21/2007 and no changes required. DM, htn, Mom died of CAD at 21 No FH colon cancer  Social History: Reviewed history from 02/26/2009 and no changes required. Lives with daughter, grandson Daymon Larsen); single; not dating currently and not sexually active; smokes 1/2 ppd;  Social EtOH, beer on weekends.  Obtains meds at [facility].  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 denies abdominal pain, denies fever/cold symptoms  Physical Exam  General:  Well-developed,well-nourished,normal body habitus;no deformities, normal grooming. Lungs:  Normal respiratory effort, chest expands symmetrically. Lungs are clear to auscultation, no crackles or wheezes. Heart:  Normal rate and regular rhythm. S1 and S2 normal without gallop, murmur, click, rub or other extra sounds. Extremities:  no edema Skin:  small  superficial  approx 1 cm in diameter ulcer on L buttock cheek without signs of infection.    Impression & Recommendations:  Problem # 1:  DIABETES MELLITUS II, UNCOMPLICATED (XX123456) Assessment Unchanged not at goal.  encouraged daily insulin use, daily sugar checks.  resume metformin - has had trouble swallowing large pills in the past.  states she thinks perhaps she could try again.  f/u in 3 months.    Her updated medication list for this problem includes:    Accuretic 20-12.5 Mg Tabs (Quinapril-hydrochlorothiazide) .Marland Kitchen... 2 by mouth once daily for blood pressure.  replaces old prescription.    Lantus 100 Unit/ml Soln (Insulin glargine) ..... Inject nightly to titrate up to goal fasting sugars of less than 200.  (estimate it will take daily is 60 units).  disp 1 mo supply.    Metformin Hcl 500 Mg Tabs (Metformin hcl) .Marland Kitchen... Titrate up as directed to goal 2 tablets twice daily  Orders: A1C-[facility] [id number]) [place]- Est  Level 4 [id number])  Problem # 2:  HYPERTENSION, BENIGN SYSTEMIC (ICD-401.1) Assessment: Unchanged continue to check BP at home.  appears to be at goal at home.  monitor.   The following medications were removed from the medication list:    Hydrochlorothiazide 25 Mg Tabs (Hydrochlorothiazide) .Marland Kitchen... Take 1 tablet by mouth once a day Her updated medication list for this problem includes:    Accuretic 20-12.5 Mg Tabs (Quinapril-hydrochlorothiazide) .Marland Kitchen... 2 by mouth once daily for blood pressure.  replaces old prescription.  Orders: Comp Met-[facility] [id number]) [place]- Est  Level 4 [id number])  Problem # 3:  ABRASION (ICD-919.0) Assessment: New encouraged use of zinc oxide barrier cream.  call if worsens.  Orders: [place]- Est  Level 4 [id number])  Complete Medication List: 1)  Accuretic 20-12.5 Mg Tabs (Quinapril-hydrochlorothiazide) .... 2 by mouth once daily for blood pressure.  replaces old prescription. 2)  Ascensia Elite Test Strp (Glucose blood) .... Please give pt glucometer test  strips, test sugar once daily 3)  Lantus 100 Unit/ml Soln (Insulin glargine) .... Inject nightly to titrate up to goal fasting sugars of less than 200.  (estimate it will take daily is 60 units).  disp 1 mo supply. 4)  Glucose Test Strips and Lancets  .... For patient's current meter.  testing 1x daily. disp 1 supply. 5)  Metformin Hcl 500 Mg Tabs (Metformin hcl) .... Titrate up as directed to goal 2 tablets twice daily  Patient Instructions: 1)  Please follow up in 3 months or of course sooner if needed. 2)  Be sure to take your insulin every day and check your sugars every AM.  3)  Start using  cream with zinc oxide for the sore on your bottom. 4)  We are starting the metformin again - start with 1 pill at bedtime for 1 week, then 1 pill twice daily for 1 week, then 1 pill in the morning and 2 pills at night for 1 week then 2 pills twice daily thereafter. 5)  It was nice to see you today! Prescriptions: ACCURETIC 20-12.5 MG TABS (  QUINAPRIL-HYDROCHLOROTHIAZIDE) 2 by mouth once daily for blood pressure.  replaces old prescription.  #60 x 3   Entered and Authorized by:   Patria Mane  MD   Signed by:   Patria Mane  MD on 09/26/2009   Method used:   Print then Give to Patient   RxID:   [id number] METFORMIN HCL 500 MG TABS (METFORMIN HCL) titrate up as directed to goal 2 tablets twice daily  #120 x 6   Entered and Authorized by:   Patria Mane  MD   Signed by:   Patria Mane  MD on 09/26/2009   Method used:   Print then Give to Patient   RxID:   [id number]   Laboratory Results   Blood Tests   Date/Time Received: September 26, 2009 10:27 AM  Date/Time Reported: September 26, 2009 10:39 AM   HGBA1C: 12.0%   (Normal Range: Non-Diabetic - 3-6%   Control Diabetic - 6-8%)  Comments: ...............test performed by......Marland KitchenBonnie A. Martinique, MLS (ASCP)cm      Prevention & Chronic Care Immunizations   Influenza vaccine: Fluvax Non-MCR  (06/29/2008)   Influenza vaccine due:  06/29/2009    Tetanus booster: 12/29/2007: given   Tetanus booster due: 12/28/2017    Pneumococcal vaccine: Done.  (10/23/1997)   Pneumococcal vaccine due: None    H. zoster vaccine: Not documented  Colorectal Screening   Hemoccult: Done.  (06/22/2005)   Hemoccult due: Not Indicated    Colonoscopy: hyperplastic polyps, serrated adenoma without neoplasm or dysplasia.  EGD showed gastritis and h. pylori  (03/28/2008)   Colonoscopy due: 03/28/2018  Other Screening   Pap smear: Done.  (05/23/1997)   Pap smear due: Not Indicated    Mammogram: ASSESSMENT: Negative - BI-RADS 1^MM DIGITAL SCREENING  (07/16/2009)   Mammogram due: 07/16/2010    DXA bone density scan: Not documented   Smoking status: current  (05/31/2009)   Smoking cessation counseling: yes  (06/29/2008)  Diabetes Mellitus   HgbA1C: 12.0  (09/26/2009)   Hemoglobin A1C due: 09/19/2008    Eye exam: per patient report was normal  (04/22/2009)    Foot exam: yes  (03/30/2008)   High risk foot: Not documented   Foot care education: Not documented   Foot exam due: 03/30/2009    Urine microalbumin/creatinine ratio: Not documented    Diabetes flowsheet reviewed?: Yes   Progress toward A1C goal: Unchanged  Lipids   Total Cholesterol: 208  (02/26/2009)   LDL: 78  (02/26/2009)   LDL Direct: 85  (03/30/2008)   HDL: 55  (02/26/2009)   Triglycerides: 376  (02/26/2009)  Hypertension   Last Blood Pressure: 147 / 81  (09/26/2009)   Serum creatinine: 0.77  (02/26/2009)   Serum potassium 4.0  (02/26/2009) CMP ordered     Hypertension flowsheet reviewed?: Yes   Progress toward BP goal: Improved  Self-Management Support :   Personal Goals (by the next clinic visit) :     Personal A1C goal: 8  (05/31/2009)     Personal blood pressure goal: 130/80  (05/31/2009)     Personal LDL goal: 100  (05/31/2009)    Diabetes self-management support: Copy of home glucose meter record, CBG self-monitoring log, Written self-care  plan  (09/26/2009)   Diabetes care plan printed    Hypertension self-management support: BP self-monitoring log, Written self-care plan, Education handout  (09/26/2009)   Hypertension self-care plan printed.   Hypertension education handout printed

## 2010-10-22 NOTE — Miscellaneous (Signed)
  Clinical Lists Changes  Medications: Rx of LANTUS 100 UNIT/ML SOLN (INSULIN GLARGINE) Inject 60 U once at night Disp 64mo supply;  #29m x 12;  Signed;  Entered by: Dorcas Mcmurray MD;  Authorized by: Dorcas Mcmurray MD;  Method used: Historical    Prescriptions: LANTUS 100 UNIT/ML SOLN (INSULIN GLARGINE) Inject 60 U once at night Disp 73mo supply  #26m x 12   Entered and Authorized by:   Dorcas Mcmurray MD   Signed by:   Dorcas Mcmurray MD on 07/23/2010   Method used:   Historical   [id number]

## 2010-10-22 NOTE — Assessment & Plan Note (Signed)
Summary: f/u dm/eo   Vital Signs:  Patient profile:   65 year old female Height:      61 inches Weight:      166 pounds BMI:     31.48 BSA:     1.75 Temp:     99.0 degrees F Pulse rate:   77 / minute BP sitting:   141 / 90  Vitals Entered By: Christen Bame CMA (April 22, 2010 9:56 AM) CC: f/u DM, HTN Is Patient Diabetic? Yes Pain Assessment Patient in pain? no        Primary Care Provider:  Patria Mane  MD  CC:  f/u DM and HTN.  History of Present Illness: DM: feels it is better.  thinks the reason she was haivng some low sugars was she stopped drinking cocac[place]. list of sugars from AM this past week: 116,102, 241, 159, 84, 160, 209.  no further lows.  she has continued to take lantus 60 U daily. she does report she doesn't think she can tolerate metformin any longer due to GI upset and wishes to discontinue this.  denies chest pains.   BP: not checking her BP at home.  reports she still had accuretic at home and she thinks this is why her BP his up because it isn't as effective for her as the generic was.  she would like to try a trial switch back to generic version and get it at [facility] before adding another medication for blood pressure.  of note she reports her leg pain has been a little better recently that she had been complaining of.   Habits & Providers  Alcohol-Tobacco-Diet     Tobacco Status: current     Tobacco Counseling: to quit use of tobacco products     Cigarette Packs/Day: occ  Current Medications (verified): 1)  Lantus 100 Unit/ml Soln (Insulin Glargine) .... Inject 60 U Once At Night Disp 51mo Supply 2)  Truetrack Test  Strp (Glucose Blood) .... Disp Strips For Testing 1-3 Times/daily.  Disp With Appropriate Lancets As Well. Disp 1 Mo Supply 3)  Citalopram Hydrobromide 40 Mg Tabs (Citalopram Hydrobromide) .... Titrate Up As Directed To Goal 1 Tablet Daily. 4)  Aspirin 81 Mg Chew (Aspirin) .... Once Daily 5)  Lisinopril-Hydrochlorothiazide 20-12.5 Mg  Tabs (Lisinopril-Hydrochlorothiazide) .... 2 Once Daily For Blood Pressure Control and Kidney Protection  Allergies (verified): 1)  * Metformin  Past History:  Past medical, surgical, family and social histories (including risk factors) reviewed for relevance to current acute and chronic problems.  Past Medical History: H Pylori treated 03/00, 7/09 h/o hemorrhoids HSV Post-menopausal vulvar and vaginal atrophy renal ultrasound wnl - 11/21/1999 2D echo- EF 55%, akinesis, mild MR - 07/19/2004 Cardiac Cath: min-> no CAD; nl LV fx - 04/14/2000 Diabetes- insulin started 05/2008 - cannot tolerate metformin due to GI upset Glaucoma- not on any eye drops Hypertension hyperplastic colon polyps (Dr. Watt Climes colonoscopy 2001 & again 03/2008 by Dr. Vaughan Basta) EGD (7/09)- gastritis and h. pylori claudication in legs  Past Surgical History: Reviewed history from 12/31/2009 and no changes required. Hysterectomy, ?bilateral or unilateral oophorectomy 1981   Family History: Reviewed history from 01/21/2007 and no changes required. DM, htn, Mom died of CAD at 47 No FH colon cancer  Social History: Reviewed history from 02/26/2009 and no changes required. Lives with daughter, grandson Daymon Larsen); single; not dating currently and not sexually active; smokes 1/2 ppd;  Social EtOH, beer on weekends.  Obtains meds at [facility].  Employed at a daycare 5 days a week.  Works at [employer]       per HPI  Physical Exam  General:  vitals reviewed.  no acute distress.  borderline HTN today.  glucose up at 187, Lungs:  Normal respiratory effort, chest expands symmetrically. Lungs are clear to auscultation, no crackles or wheezes. Heart:  Normal rate and regular rhythm. S1 and S2 normal without gallop, murmur, click, rub or other extra sounds. Extremities:  no edema   Impression & Recommendations:  Problem # 1:  DIABETES MELLITUS II, UNCOMPLICATED  ([id number]) Assessment Improved  improved.  patient informed that with stopping her metformin she may need to increase her lantus dose.  she will keep an eye on her sugars and if going higher she will make an appt.   due for recheck of cmet, cholesterol - she will schedule fasting appt in next few weeks  The following medications were removed from the medication list:    Metformin Hcl 500 Mg Tabs (Metformin hcl) .Marland Kitchen... Titrate up to goal 1 tablet three times daily for diabetes Her updated medication list for this problem includes:    Lantus 100 Unit/ml Soln (Insulin glargine) ..... Inject 60 u once at night disp 71mo supply    Aspirin 81 Mg Chew (Aspirin) ..... Once daily    Lisinopril-hydrochlorothiazide 20-12.5 Mg Tabs (Lisinopril-hydrochlorothiazide) .Marland Kitchen... 2 once daily for blood pressure control and kidney protection  Orders: Glucose Cap-[facility] [id number]) [facility]- Est Level  3 [id number])  Labs Reviewed: Creat: 0.80 (09/26/2009)     Last Eye Exam: per patient report was normal (04/22/2009) Reviewed HgBA1c results: 8.5 (04/09/2010)  8.6 (12/31/2009)  Problem # 2:  HYPERTENSION, BENIGN SYSTEMIC (ICD-401.1) Assessment: Improved  change to generic for trial f/u in about 6 weeks.   if not improved would consider addition of norvasc and/or nutrition visit for diet  Her updated medication list for this problem includes:    Lisinopril-hydrochlorothiazide 20-12.5 Mg Tabs (Lisinopril-hydrochlorothiazide) .Marland Kitchen... 2 once daily for blood pressure control and kidney protection  BP today: 141/90 Prior BP: 163/78 (04/09/2010)  Labs Reviewed: K+: 4.2 (09/26/2009) Creat: : 0.80 (09/26/2009)   Chol: 208 (02/26/2009)   HDL: 55 (02/26/2009)   LDL: 78 (02/26/2009)   TG: 376 (02/26/2009)  Orders: [employer]- Est Level  3 ([id number])  Complete Medication List: 1)  Lantus 100 Unit/ml Soln (Insulin glargine) .... Inject 60 u once at night disp 28mo supply 2)  Truetrack Test Strp (Glucose blood) .... Disp strips for  testing 1-3 times/daily.  disp with appropriate lancets as well. disp 1 mo supply 3)  Citalopram Hydrobromide 40 Mg Tabs (Citalopram hydrobromide) .... Titrate up as directed to goal 1 tablet daily. 4)  Aspirin 81 Mg Chew (Aspirin) .... Once daily 5)  Lisinopril-hydrochlorothiazide 20-12.5 Mg Tabs (Lisinopril-hydrochlorothiazide) .... 2 once daily for blood pressure control and kidney protection  Patient Instructions: 1)  Please schedule an appointment one morning in the lab when you've had nothing to eat or drink since midnight the night before. 2)  Please follow up in about 6 weeks to review your blood pressure and diabetes. Bring a list of your sugars to that visit. 3)  It was nice to see you today! Prescriptions: LISINOPRIL-HYDROCHLOROTHIAZIDE 20-12.5 MG TABS (LISINOPRIL-HYDROCHLOROTHIAZIDE) 2 once daily for blood pressure control and kidney protection  #60 x 6   Entered and Authorized by:   Patria Mane  MD   Signed by:   Patria Mane  MD on 04/22/2010   Method  used:   Electronically to        [facility] (retail)       [address]       [place], [place]  [address]       Ph: [phone]       Fax: [phone]   RxID:   [id number] TRUETRACK TEST  STRP (GLUCOSE BLOOD) disp strips for testing 1-3 times/daily.  Disp with appropriate lancets as well. Disp 1 mo supply  #1 x 11   Entered and Authorized by:   Patria Mane  MD   Signed by:   Patria Mane  MD on 04/22/2010   Method used:   Faxed to ...       [facility] (retail)       [address] [place], [place]  [address]       Ph: [phone]       Fax: [phone]   RxID:   [id number]   Appended Document: Orders Update    Clinical Lists Changes  Orders: Added new Test order of Comp Met-[facility] [id number]) - Signed Added new Test order of Lipid-[facility] [id number]) - Signed

## 2010-10-22 NOTE — Assessment & Plan Note (Signed)
Summary: [facility]   Vital Signs:  Patient profile:   65 year old female Height:      61 inches Weight:      162.8 pounds BMI:     30.87 Temp:     98.4 degrees F oral Pulse rate:   69 / minute BP sitting:   108 / 73  (right arm) Cuff size:   regular  Vitals Entered By: Levert Feinstein LPN (May  6, 624THL [id number] AM)   Primary Care Provider:  Patria Mane  MD   History of Present Illness: Reports pain in both legs starting on calves and sometimes in thighs with walking over 1 and 1/2 blocks.  Pain is described as numbness and Aching/Cramping.   Reports pain aleviated after rest.       Current Medications (verified): 1)  Ascensia Elite Test   Strp (Glucose Blood) .... Please Give Pt Glucometer Test Strips, Test Sugar Once Daily 2)  Lantus 100 Unit/ml Soln (Insulin Glargine) .... 60 Units Daily 3)  Glucose Test Strips and Lancets .... For Patient's Current Meter.  Testing 1x Daily. Disp 1 Supply. 4)  Metformin Hcl 500 Mg Tabs (Metformin Hcl) .... Titrate Up To Goal 1 Tablet Three Times Daily For Diabetes 5)  Citalopram Hydrobromide 40 Mg Tabs (Citalopram Hydrobromide) .... Titrate Up As Directed To Goal 1 Tablet Daily. 6)  Aspirin 81 Mg Chew (Aspirin) .... Once Daily 7)  Lisinopril-Hydrochlorothiazide 20-12.5 Mg Tabs (Lisinopril-Hydrochlorothiazide) .... 2 Once Daily  Allergies (verified): No Known Drug Allergies  Physical Exam  Extremities:  Lower extremity Physical Exam includes: Cool / cold to palpation, delayed capillary refill, absent / diminished pulses, absence of limb hair, thinning of subcutaneous fat, current ulceration, cyanosis, pallor, edema, thickened brittle nails.  Left / Right / Both ABI overall = : 0.70 Right Arm: 172  mmHg    Left Arm:150   mmHg Right ankle posterior tibial: 88   mmHg     dorsalis pedis: 120   mmHg Left ankle posterior tibial:  130   mmHg    dorsalis pedis:  134 mmHg     Impression & Recommendations:  Problem # 1:  CLAUDICATION  (ICD-443.9) Assessment Unchanged  Mild/Moderate PAD based on ABI of: 0.70    In a patient with symptoms of  numbness in both calves and sometimes thighs.  Patient already on aspirin 81 mg.  Counseled on exercise and continuing to walk distances less than 1 and 1/2 blocks.  Consider reevaluation of LDL target to <70 and consider initiating a statin to reduce plaque.  TTFFC:  25 minutes. Seen with Ralene Bathe, PharmD Candidate and Nena Jordan PharmD resident.   Orders: Reassessment Each 15 min unit- [place] [id number])  Problem # 2:  TOBACCO USER (ICD-305.1)  Reports no longer smoking - Reassess  smoking status at next visit if remains smoke free...change smoking status on problem list to "past history of smoking"  Orders: Reassessment Each 15 min unit- [facility] [id number])  Complete Medication List: 1)  Ascensia Elite Test Strp (Glucose blood) .... Please give pt glucometer test strips, test sugar once daily 2)  Lantus 100 Unit/ml Soln (Insulin glargine) .... 60 units daily 3)  Glucose Test Strips and Lancets  .... For patient's current meter.  testing 1x daily. disp 1 supply. 4)  Metformin Hcl 500 Mg Tabs (Metformin hcl) .... Titrate up to goal 1 tablet three times daily for diabetes 5)  Citalopram Hydrobromide 40 Mg Tabs (Citalopram hydrobromide) .... Titrate up as directed  to goal 1 tablet daily. 6)  Aspirin 81 Mg Chew (Aspirin) .... Once daily 7)  Lisinopril-hydrochlorothiazide 20-12.5 Mg Tabs (Lisinopril-hydrochlorothiazide) .... 2 once daily Prescriptions: LISINOPRIL-HYDROCHLOROTHIAZIDE 20-12.5 MG TABS (LISINOPRIL-HYDROCHLOROTHIAZIDE) 2 once daily  #60 x 3   Entered and Authorized by:   Rinaldo Ratel D   Signed by:   Janeann Forehand Pharm D on 01/25/2010   Method used:   Historical   [id number] ASPIRIN 81 MG CHEW (ASPIRIN) once daily  #1 x 0   Entered and Authorized by:   Rinaldo Ratel D   Signed by:   Janeann Forehand Pharm D on 01/25/2010   Method used:   Historical   [id number]   Prevention & Chronic Care Immunizations   Influenza vaccine: Fluvax Non-MCR  (06/29/2008)   Influenza vaccine due: 06/29/2009    Tetanus booster: 12/29/2007: given   Tetanus booster due: 12/28/2017    Pneumococcal vaccine: Done.  (10/23/1997)   Pneumococcal vaccine due: None    H. zoster vaccine: Not documented  Colorectal Screening   Hemoccult: Done.  (06/22/2005)   Hemoccult due: Not Indicated    Colonoscopy: hyperplastic polyps, serrated adenoma without neoplasm or dysplasia.  EGD showed gastritis and h. pylori  (03/28/2008)   Colonoscopy due: 03/28/2018  Other Screening   Pap smear: Done.  (05/23/1997)   Pap smear due: Not Indicated    Mammogram: ASSESSMENT: Negative - BI-RADS 1^MM DIGITAL SCREENING  (07/16/2009)   Mammogram due: 07/16/2010    DXA bone density scan: Not documented   Smoking status: never  (12/31/2009)  Diabetes Mellitus   HgbA1C: 8.6  (12/31/2009)   Hemoglobin A1C due: 09/19/2008    Eye exam: per patient report was normal  (04/22/2009)    Foot exam: yes  (12/31/2009)   High risk foot: Not documented   Foot care education: Not documented   Foot exam due: 03/30/2009    Urine microalbumin/creatinine ratio: Not documented    Diabetes flowsheet reviewed?: Yes   Progress toward A1C goal: Unchanged  Lipids   Total Cholesterol: 208  (02/26/2009)   LDL: 78  (02/26/2009)   LDL Direct: 85  (03/30/2008)   HDL: 55  (02/26/2009)   Triglycerides: 376  (02/26/2009)  Hypertension   Last Blood Pressure: 108 / 73  (01/25/2010)   Serum creatinine: 0.80  (09/26/2009)   Serum potassium 4.2  (09/26/2009)    Hypertension flowsheet reviewed?: Yes   Progress toward BP goal: At goal  Self-Management Support :   Personal Goals (by the next clinic visit) :     Personal A1C goal: 8  (05/31/2009)     Personal blood pressure goal: 130/80  (05/31/2009)     Personal LDL goal: 100  (05/31/2009)    Diabetes self-management support: Copy of  home glucose meter record, CBG self-monitoring log, Written self-care plan  (09/26/2009)    Diabetes self-management support not done because: Good outcomes  (12/31/2009)    Hypertension self-management support: BP self-monitoring log  (12/31/2009)    Hypertension self-management support not done because: Not indicated  (12/31/2009)

## 2010-10-22 NOTE — Assessment & Plan Note (Signed)
Summary: diabetic,tcb   Vital Signs:  Patient profile:   65 year old female Height:      61 inches Weight:      165 pounds BMI:     31.29 BSA:     1.74 Temp:     98.7 degrees F Pulse rate:   79 / minute BP sitting:   163 / 78  Vitals Entered By: Anna [name] CMA (April 09, 2010 9:51 AM)  Serial Vital Signs/Assessments:  Time      Position  BP       Pulse  Resp  Temp     By                     143/83                         Anna Latina MD  CC: Diabetes problems Is Patient Diabetic? Yes Did you bring your meter with you today? No Pain Assessment Patient in pain? no        Primary Care Provider:  Patria Mane  MD  CC:  Diabetes problems.  History of Present Illness: 1. Hypoglycemia:  Pt has been having hypoglycemia for the past week - Blood sugars have been averaging between 69-100 in the mornings - She has not increased her Lantus.  She is taking the Lantus / Metformin as prescribed - She has not changed her diet.  Still eating the same amount of carbs  ROS: when she is hypoglycemic she feels dizzy, weak.    2. UTI:  Went to UC because of the hypoglycemia and found to have a UTI - Was complaining of only mild dysuria - Prescribed Macrobid which she has been taking but is having trouble swallowing the capsules  ROS: denies any current dysuria, urinary urgeny / frequency, hematuria, flank pain, fevers  3. HTN: - She is taking her medicines as prescribed - Doesn't check her blood pressure at home  ROS: denies chest pain or shortnes of breath    Habits & Providers  Alcohol-Tobacco-Diet     Tobacco Status: current     Year Started: occ  Current Medications (verified): 1)  Ascensia Elite Test   Strp (Glucose Blood) .... Please Give Pt Glucometer Test Strips, Test Sugar Once Daily 2)  Lantus 100 Unit/ml Soln (Insulin Glargine) .... Inject 50 U Once At Night Disp 83mo Supply 3)  Glucose Test Strips and Lancets .... For Patient's Current Meter.  Testing 1x  Daily. Disp 1 Supply. 4)  Metformin Hcl 500 Mg Tabs (Metformin Hcl) .... Titrate Up To Goal 1 Tablet Three Times Daily For Diabetes 5)  Citalopram Hydrobromide 40 Mg Tabs (Citalopram Hydrobromide) .... Titrate Up As Directed To Goal 1 Tablet Daily. 6)  Aspirin 81 Mg Chew (Aspirin) .... Once Daily 7)  Lisinopril-Hydrochlorothiazide 20-12.5 Mg Tabs (Lisinopril-Hydrochlorothiazide) .... 2 Once Daily  Allergies: No Known Drug Allergies  Past History:  Past Medical History: Reviewed history from 12/31/2009 and no changes required. H Pylori treated 03/00, 7/09 h/o hemorrhoids HSV Post-menopausal vulvar and vaginal atrophy renal ultrasound wnl - 11/21/1999 2D echo- EF 55%, akinesis, mild MR - 07/19/2004 Cardiac Cath: min-> no CAD; nl LV fx - 04/14/2000 Diabetes- insulin started 05/2008 Glaucoma- not on any eye drops Hypertension hyperplastic colon polyps (Dr. Watt Climes colonoscopy 2001 & again 03/2008 by Dr. Vaughan Basta) EGD (7/09)- gastritis and h. pylori  Social History: Reviewed history from 02/26/2009 and no changes required. Lives with daughter, grandson (  Anna [name]); single; not dating currently and not sexually active; smokes 1/2 ppd;  Social EtOH, beer on weekends.  Obtains meds at [facility].  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  no acute distress Head:  normocephalic and atraumatic.   Eyes:  normal conjunctiva Mouth:  pharynx pink and moist.   Neck:  supple and no masses.   Lungs:  Normal respiratory effort, chest expands symmetrically. Lungs are clear to auscultation, no crackles or wheezes. Heart:  Normal rate and regular rhythm. S1 and S2 normal without gallop, murmur, click, rub or other extra sounds. Abdomen:  Bowel sounds positive,abdomen soft and non-tender without masses, organomegaly or hernias noted. Neurologic:  alert & oriented X3.   Skin:  no rashes.      Impression & Recommendations:  Problem # 1:  DIABETES MELLITUS II, UNCOMPLICATED ([id number]) Assessment Deteriorated Unsure why she is having these hypoglycemic episodes.  Will decrease Lantus to 50 U daily. Her updated medication list for this problem includes:    Lantus 100 Unit/ml Soln (Insulin glargine) ..... Inject 50 u once at night disp 4mo supply    Metformin Hcl 500 Mg Tabs (Metformin hcl) .Marland Kitchen... Titrate up to goal 1 tablet three times daily for diabetes    Aspirin 81 Mg Chew (Aspirin) ..... Once daily    Lisinopril-hydrochlorothiazide 20-12.5 Mg Tabs (Lisinopril-hydrochlorothiazide) .Marland Kitchen... 2 once daily  Orders: A1C-[facility] [id number]) [place]- Est  Level 4 [id number])  Problem # 2:  UTI (ICD-599.0) Assessment: New  This is improving.  Advised her to continue to take the [place] as prescribed and that if she needs to she can break up the capsules and put them in something.  Orders: [place]- Est  Level 4 [id number])  Problem # 3:  HYPERTENSION, BENIGN SYSTEMIC (ICD-401.1) Assessment: Unchanged  Not at goal.  Improved on recheck at 143/80.  Would consider adding Norvasc. Her updated medication list for this problem includes:    Lisinopril-hydrochlorothiazide 20-12.5 Mg Tabs (Lisinopril-hydrochlorothiazide) .Marland Kitchen... 2 once daily  Orders: [place]- Est  Level 4 [id number])  Complete Medication List: 1)  Ascensia Elite Test Strp (Glucose blood) .... Please give pt glucometer test strips, test sugar once daily 2)  Lantus 100 Unit/ml Soln (Insulin glargine) .... Inject 50 u once at night disp 27mo supply 3)  Glucose Test Strips and Lancets  .... For patient's current meter.  testing 1x daily. disp 1 supply. 4)  Metformin Hcl 500 Mg Tabs (Metformin hcl) .... Titrate up to goal 1 tablet three times daily for diabetes 5)  Citalopram Hydrobromide 40 Mg Tabs (Citalopram hydrobromide) .... Titrate up as directed to goal 1 tablet daily. 6)  Aspirin 81 Mg Chew (Aspirin) .... Once daily 7)   Lisinopril-hydrochlorothiazide 20-12.5 Mg Tabs (Lisinopril-hydrochlorothiazide) .... 2 once daily  Patient Instructions: 1)  I think that your blood sugars have been low for the past week because your body hasn't required as much insulin 2)  We will decrease the Insulin to 50 U 3)  The protein in your uine is probably from your diabetes 4)  Continue taking the antibiotic as prescribed.  Since your having trouble with the capsules you can break them open and put them into apple sauce. 5)  Please schedule a follow up appointment with Dr. Barbra Sarks in 2-3 weeks to recheck your sugars and check your blood pressure  Laboratory Results   Blood Tests   Date/Time Received: April 09, 2010 9:34 AM  Date/Time Reported: April 09, 2010 9:59 AM   HGBA1C: 8.5%   (Normal Range: Non-Diabetic - 3-6%   Control Diabetic - 6-8%)  Comments: ..............test performed by.................Marland KitchenChristen [name], CMA .............entered by...........Marland KitchenBonnie A. Martinique, MLS (ASCP)cm

## 2010-10-24 NOTE — Assessment & Plan Note (Signed)
Summary: Diabetes - Start Novolog Insulin   Vital Signs:  Patient profile:   65 year old female Weight:      167 pounds Pulse rate:   71 / minute BP sitting:   132 / 85  (left arm)  Primary Care Provider:  Cletus Gash MD   History of Present Illness: Patient arrives for initiation of meal time insulin.  She appears well hydrated and in a good mood.  Currently, she is using 60 units of Lantus qAM and her morning blood sugars range from 80s to the 200's.  Patient is currently only checking fasting morning blood sugars and will repeat if low.  During the last month she has had 2 symptomatic hypoglycemic events which were adequately treated.  Her last A1C in December 2011 was 10.8.  Patient reports eating a biscuit for breakfast most mornings, a salad for lunch, and that dinner is usually her biggest meal and may include fried chicken and potatoes.  Due to cost patient does not eat a lot of fresh fruits/vegetables.  She does admit to drinking two 24 ounce cokes and sweet tea throughout the day.    Current Medications (verified): 1)  Lantus 100 Unit/ml Soln (Insulin Glargine) .... Inject 60 U Once At Night Disp 49mo Supply 2)  Truetrack Test  Strp (Glucose Blood) .... Disp Strips For Testing 1-3 Times/daily.  Disp With Appropriate Lancets As Well. Disp 1 Mo Supply 3)  Aspirin 81 Mg Chew (Aspirin) .... Once Daily 4)  Quinapril-Hydrochlorothiazide 20-12.5 Mg Tabs (Quinapril-Hydrochlorothiazide) .... 2 Once Daily  Allergies: 1)  * Metformin   Impression & Recommendations:  Problem # 1:  DIABETES MELLITUS II, UNCOMPLICATED ([id number]) Assessment Unchanged  Diabetes of long standing duration, currently under poor blood glucose control based on A1C of 10.8 in Dec. 2011:    Fasting CBGs of:  80-200s Control is suboptimal due to poor meal time insulin coverage:  Patient has had 2 hypoclycemic events during the past month.  Able to verbalize appropriate hypoglycemia management plan.  Reports  adherence with: Lantus  60 units qAM which was reduced to 50 units QAM at this visit.  Initiated rapid insulin Apidra Solostar pen 8 units with breakfast and lunch AND 12 units with evening meal.  Patient will also begin checking blood glucose several times a week at different times to the day in addition to morning fasting blood gluocose test. She does plan to elimiate coke and sweet tea through the use of Crystal Lite packets.  Patient was advised that this may reduce her insulin requirement and was told to call the office if her blood sugars begin to run too low due to change in diet.  Physical activity was encouraged along with increasing frozen vegetable intake in diet.  Written pt instructions provided:  F/U Rx Clinic Visit: Pharmacy clinic in 2 weeks.  TTFFC: 30 mins.  Pt seen with: Mliss Sax  The following medications were removed from the medication list:    Lisinopril-hydrochlorothiazide 20-12.5 Mg Tabs (Lisinopril-hydrochlorothiazide) .Marland Kitchen... 2 once daily for blood pressure control and kidney protection Her updated medication list for this problem includes:    Lantus 100 Unit/ml Soln (Insulin glargine) ..... Inject 50 units qam disp 82mo supply    Aspirin 81 Mg Chew (Aspirin) ..... Once daily    Quinapril-hydrochlorothiazide 20-12.5 Mg Tabs (Quinapril-hydrochlorothiazide) .Marland Kitchen... 2 once daily    Apidra Solostar 100 Unit/ml Soln (Insulin glulisine) ..... Use 8 units prior to breakfast and lunch and 12 units prior to evening meal  Orders: Reassessment Each 15 min unit- [place] [id number])  Problem # 2:  HYPERTENSION, BENIGN SYSTEMIC (ICD-401.1) Assessment: Unchanged  Blood pressure is almost at goal.  Today BP was 131/85.  Patient reports taking medication as directed.  Switch from lisinopril/hctz to quinapril htcz due to enrollment in MAP program through the [facility].    The following medications were removed from the medication list:    Lisinopril-hydrochlorothiazide  20-12.5 Mg Tabs (Lisinopril-hydrochlorothiazide) .Marland Kitchen... 2 once daily for blood pressure control and kidney protection Her updated medication list for this problem includes:    Quinapril-hydrochlorothiazide 20-12.5 Mg Tabs (Quinapril-hydrochlorothiazide) .Marland Kitchen... 2 once daily  [id number]  Complete Medication List: 1)  Lantus 100 Unit/ml Soln (Insulin glargine) .... Inject 50 units qam disp 25mo supply 2)  Truetrack Test Strp (Glucose blood) .... Disp strips for testing 1-3 times/daily.  disp with appropriate lancets as well. disp 1 mo supply 3)  Aspirin 81 Mg Chew (Aspirin) .... Once daily 4)  Quinapril-hydrochlorothiazide 20-12.5 Mg Tabs (Quinapril-hydrochlorothiazide) .... 2 once daily 5)  Apidra Solostar 100 Unit/ml Soln (Insulin glulisine) .... Use 8 units prior to breakfast and lunch and 12 units prior to evening meal  Patient Instructions: 1)  Morning blood sugars should be around 100.  Anything in the range of 80-120 is great. 2)  Two hours after you eat the goal is to keep it under 180.  Anything under 200 is ok. 3)  Change Lantus to 50 units in the morning.  4)  Start Apidra 8 units with Breakfast and Lunch.  Use 12 units prior to evening meal.  5)  Pharmacist Clinic visit in 2 weeks.  Prescriptions: LANTUS 100 UNIT/ML SOLN (INSULIN GLARGINE) Inject 50 units QAM Disp 19mo supply  #1 x 0   Entered and Authorized by:   Rinaldo Ratel D   Signed by:   Janeann Forehand Pharm D on 10/04/2010   Method used:   Historical   [id number] APIDRA SOLOSTAR 100 UNIT/ML SOLN (INSULIN GLULISINE) Use 8 units prior to breakfast and lunch AND 12 units prior to evening meal  #1 x 0   Entered and Authorized by:   Rinaldo Ratel D   Signed by:   Janeann Forehand Pharm D on 10/04/2010   Method used:   Historical   [id number] QUINAPRIL-HYDROCHLOROTHIAZIDE 20-12.5 MG TABS (QUINAPRIL-HYDROCHLOROTHIAZIDE) 2 once daily  #1 x 0   Entered and  Authorized by:   Rinaldo Ratel D   Signed by:   Janeann Forehand Pharm D on 10/04/2010   Method used:   Historical   [id number]    Orders Added: 1)  Reassessment Each 15 min unit- [facility] [id number]    Prevention & Chronic Care Immunizations   Influenza vaccine: Fluvax Non-MCR  (06/29/2008)   Influenza vaccine due: 06/29/2009    Tetanus booster: 12/29/2007: given   Tetanus booster due: 12/28/2017    Pneumococcal vaccine: Done.  (10/23/1997)   Pneumococcal vaccine due: None    H. zoster vaccine: Not documented  Colorectal Screening   Hemoccult: Done.  (06/22/2005)   Hemoccult due: Not Indicated    Colonoscopy: hyperplastic polyps, serrated adenoma without neoplasm or dysplasia.  EGD showed gastritis and h. pylori  (03/28/2008)   Colonoscopy due: 03/28/2018  Other Screening   Pap smear: Done.  (05/23/1997)   Pap smear due: Not Indicated    Mammogram: ASSESSMENT: Negative - BI-RADS 1^MM DIGITAL SCREENING  (07/17/2010)  Mammogram due: 07/16/2010    DXA bone density scan: Not documented   Smoking status: quit  (09/03/2010)  Diabetes Mellitus   HgbA1C: 10.8  (09/03/2010)   HgbA1C action/deferral: Ordered  (09/03/2010)   Hemoglobin A1C due: 09/03/2010    Eye exam: per patient report was normal  (04/22/2009)    Foot exam: yes  (12/31/2009)   High risk foot: Not documented   Foot care education: Not documented   Foot exam due: 03/30/2009    Urine microalbumin/creatinine ratio: Not documented    Diabetes flowsheet reviewed?: Yes   Progress toward A1C goal: Improved  Lipids   Total Cholesterol: 208  (02/26/2009)   LDL: 78  (02/26/2009)   LDL Direct: 85  (03/30/2008)   HDL: 55  (02/26/2009)   Triglycerides: 376  (02/26/2009)  Hypertension   Last Blood Pressure: 132 / 85  (10/04/2010)   Serum creatinine: 0.80  (09/26/2009)   Serum potassium 4.2  (09/26/2009)    Hypertension flowsheet reviewed?: Yes   Progress toward BP goal:  Improved  Self-Management Support :   Personal Goals (by the next clinic visit) :     Personal A1C goal: 8  (05/31/2009)     Personal blood pressure goal: 130/80  (05/31/2009)     Personal LDL goal: 100  (05/31/2009)    Diabetes self-management support: Copy of home glucose meter record, CBG self-monitoring log  (09/03/2010)    Diabetes self-management support not done because: Good outcomes  (12/31/2009)    Hypertension self-management support: BP self-monitoring log  (12/31/2009)    Hypertension self-management support not done because: Not indicated  (12/31/2009)

## 2010-10-24 NOTE — Miscellaneous (Signed)
  Clinical Lists Changes  Problems: Removed problem of UTI (ICD-599.0) Removed problem of EXCESSIVE CRYING OF CHILD ADOLESCENT OR ADULT (ICD-780.95)

## 2010-10-24 NOTE — Assessment & Plan Note (Signed)
Summary: F/U DM,DF   Vital Signs:  Patient profile:   65 year old female Height:      61 inches Weight:      168 pounds BMI:     31.86 BSA:     1.76 Temp:     98.8 degrees F Pulse rate:   84 / minute BP sitting:   133 / 77  Vitals Entered By: Christen Bame CMA (September 03, 2010 3:37 PM) CC: f/u DM Is Patient Diabetic? Yes Pain Assessment Patient in pain? yes     Location: right hand Intensity: 7   Primary Provider:  Cletus Gash MD  CC:  f/u DM.  History of Present Illness: Pt comes in for DM follow up. She says sometimes her blood sugars run high, this morning it was 298.  She says it is never lower than 120.  She is taking 60 U of Lantus in the morning, and no other medications.  She stopped metformin in July because it upsets her stomach.    She complains that she went to the ED due to Left hand pain and was diagnosed with dejenerative joint disease.  She says her right hand hurts now too.  She recieved a splint for the left in the ED which has helped, and is wondering if she can get one for the right hand.  HTN- pt is taking medications regularly, no chest pain, shortness of breath.    Obesity- Pt. is not exercising much.  Says she walks a few blocks to and from the bus stop, but that is all.  Pt. says she is willing to walk a little more than that.    Habits & Providers  Alcohol-Tobacco-Diet     Tobacco Status: quit     Tobacco Counseling: to quit use of tobacco products     Cigarette Packs/Day: occ     Year Started: occ     Year Quit: 1 week ago  Current Medications (verified): 1)  Lantus 100 Unit/ml Soln (Insulin Glargine) .... Inject 60 U Once At Night Disp 66mo Supply 2)  Truetrack Test  Strp (Glucose Blood) .... Disp Strips For Testing 1-3 Times/daily.  Disp With Appropriate Lancets As Well. Disp 1 Mo Supply 3)  Citalopram Hydrobromide 40 Mg Tabs (Citalopram Hydrobromide) .... Titrate Up As Directed To Goal 1 Tablet Daily. 4)  Aspirin 81 Mg Chew  (Aspirin) .... Once Daily 5)  Lisinopril-Hydrochlorothiazide 20-12.5 Mg Tabs (Lisinopril-Hydrochlorothiazide) .... 2 Once Daily For Blood Pressure Control and Kidney Protection  Allergies: 1)  * Metformin  Social History: Smoking Status:  quit  Review of Systems       Negative except HPI  Physical Exam  General:  Well-developed,well-nourished,in no acute distress; alert,appropriate and cooperative throughout examination Eyes:  No corneal or conjunctival inflammation noted. EOMI. Perrla. Funduscopic exam benign, without hemorrhages, exudates or papilledema. Vision grossly normal. Mouth:  Oral mucosa and oropharynx without lesions or exudates.  Teeth in good repair. Neck:  No deformities, masses, or tenderness noted. Lungs:  Normal respiratory effort, chest expands symmetrically. Lungs are clear to auscultation, no crackles or wheezes. Heart:  Normal rate and regular rhythm. S1 and S2 normal without gallop, murmur, click, rub or other extra sounds. Abdomen:  Bowel sounds positive,abdomen soft and non-tender without masses, organomegaly or hernias noted. Msk:  Left wrist in splint.  Pain with adduction against resistance of thumb bilaterally.  No TTP, no joint effusion, no pain with other movement.  Pulses:  R and L carotid,radial, dorsalis pedis  and posterior tibial pulses are full and equal bilaterally   Impression & Recommendations:  Problem # 1:  DIABETES MELLITUS II, UNCOMPLICATED ([id number]) Assessment Deteriorated Pt with poor control since discontinuing metformin.  She has not previously done insulin with meals.  Will refer to Rx clinic for appropriate medication changes and teaching.  Pt was given new glucose log book and asked to bring it to Rx appointment.  Her updated medication list for this problem includes:    Lantus 100 Unit/ml Soln (Insulin glargine) ..... Inject 60 u once at night disp 14mo supply    Aspirin 81 Mg Chew (Aspirin) ..... Once daily     Lisinopril-hydrochlorothiazide 20-12.5 Mg Tabs (Lisinopril-hydrochlorothiazide) .Marland Kitchen... 2 once daily for blood pressure control and kidney protection  Orders: A1C-[facility] [id number]) [place]- Est  Level 4 (99214)Future Orders: Lipid-[facility] [id number]) ... 09/01/2011  Problem # 2:  HAND PAIN, BILATERAL (ICD-729.5) Likely due to arthritis. Pt's left hand pain improved with a splint, so will give right hand splint and continue to monitor.  Orders: [place]- Est  Level 4 [id number])  Problem # 3:  HYPERTENSION, BENIGN SYSTEMIC (ICD-401.1) Well controlled.  Will check BMET to monitor creatinine and electrolytes.  Her updated medication list for this problem includes:    Lisinopril-hydrochlorothiazide 20-12.5 Mg Tabs (Lisinopril-hydrochlorothiazide) .Marland Kitchen... 2 once daily for blood pressure control and kidney protection  Future Orders: Basic Met-[facility] [id number]) ... 09/01/2011  Problem # 4:  OBESITY, NOS (ICD-278.00) Pt's weight is stable, she is resistant to exercise or diet modification at this time.  Will order fasting lipid panel and continue to encourage healthy lifestyle changes.   Orders: [facility]- Est  Level 4 (99214)Future Orders: Lipid-[facility] [id number]) ... 09/01/2011  Complete Medication List: 1)  Lantus 100 Unit/ml Soln (Insulin glargine) .... Inject 60 u once at night disp 79mo supply 2)  Truetrack Test Strp (Glucose blood) .... Disp strips for testing 1-3 times/daily.  disp with appropriate lancets as well. disp 1 mo supply 3)  Citalopram Hydrobromide 40 Mg Tabs (Citalopram hydrobromide) .... Titrate up as directed to goal 1 tablet daily. 4)  Aspirin 81 Mg Chew (Aspirin) .... Once daily 5)  Lisinopril-hydrochlorothiazide 20-12.5 Mg Tabs (Lisinopril-hydrochlorothiazide) .... 2 once daily for blood pressure control and kidney protection i  Patient Instructions: 1)  It was nice to meet you. 2)  I want you to make an appointment to see Dr. Valentina Lucks in Pharmacy clinic for help with your diabetes medicines.  Your  hemoglobin A1c was 10.8 today, which has increased from 8.5 since you stopped taking metformin.  Please keep a log of your blood sugars and bring them to your visit with Dr. Valentina Lucks.  3)  Your blood pressure control is very good today.   4)  Please make a lab appointment to have your cholesterol checked for first thing in the morning, before you eat anything.  5)  I want to see you for Diabetes follow up in 6 weeks to 2 months. 6)  Contact the office if you have questions or concerns.    Orders Added: 1)  A1C-[facility] [83036] 2)  Basic Met-[facility] [id number] 3)  Lipid-[facility] [[id number]] 4)  [place]- Est  Level 4 [id number]    Laboratory Results   Blood Tests   Date/Time Received: September 03, 2010 3:22 PM  Date/Time Reported: September 03, 2010 3:40 PM   HGBA1C: 10.8%   (Normal Range: Non-Diabetic - 3-6%   Control Diabetic - 6-8%)  Comments: ..............test performed by.................Marland KitchenChristen Bame, CMA .............entered by...........Marland KitchenBonnie A. Martinique, MLS (ASCP)cm  Prevention & Chronic Care Immunizations   Influenza vaccine: Fluvax Non-MCR  (06/29/2008)   Influenza vaccine due: 06/29/2009    Tetanus booster: 12/29/2007: given   Tetanus booster due: 12/28/2017    Pneumococcal vaccine: Done.  (10/23/1997)   Pneumococcal vaccine due: None    H. zoster vaccine: Not documented  Colorectal Screening   Hemoccult: Done.  (06/22/2005)   Hemoccult due: Not Indicated    Colonoscopy: hyperplastic polyps, serrated adenoma without neoplasm or dysplasia.  EGD showed gastritis and h. pylori  (03/28/2008)   Colonoscopy due: 03/28/2018  Other Screening   Pap smear: Done.  (05/23/1997)   Pap smear due: Not Indicated    Mammogram: ASSESSMENT: Negative - BI-RADS 1^MM DIGITAL SCREENING  (07/17/2010)   Mammogram due: 07/16/2010    DXA bone density scan: Not documented   Smoking status: quit  (09/03/2010)  Diabetes Mellitus   HgbA1C: 10.8  (09/03/2010)   HgbA1C  action/deferral: Ordered  (09/03/2010)   Hemoglobin A1C due: 09/03/2010    Eye exam: per patient report was normal  (04/22/2009)    Foot exam: yes  (12/31/2009)   High risk foot: Not documented   Foot care education: Not documented   Foot exam due: 03/30/2009    Urine microalbumin/creatinine ratio: Not documented    Diabetes flowsheet reviewed?: Yes   Progress toward A1C goal: Deteriorated    Stage of readiness to change (diabetes management): Action  Lipids   Total Cholesterol: 208  (02/26/2009)   LDL: 78  (02/26/2009)   LDL Direct: 85  (03/30/2008)   HDL: 55  (02/26/2009)   Triglycerides: 376  (02/26/2009)  Hypertension   Last Blood Pressure: 133 / 77  (09/03/2010)   Serum creatinine: 0.80  (09/26/2009)   Serum potassium 4.2  (09/26/2009)    Hypertension flowsheet reviewed?: Yes   Progress toward BP goal: Improved    Stage of readiness to change (hypertension management): Action  Self-Management Support :   Personal Goals (by the next clinic visit) :     Personal A1C goal: 8  (05/31/2009)     Personal blood pressure goal: 130/80  (05/31/2009)     Personal LDL goal: 100  (05/31/2009)    Patient will work on the following items until the next clinic visit to reach self-care goals:     Medications and monitoring: take my medicines every day, check my blood sugar  (09/03/2010)     Eating: drink diet soda or water instead of juice or soda  (09/03/2010)     Activity: take a 30 minute walk every day  (09/03/2010)    Diabetes self-management support: Copy of home glucose meter record, CBG self-monitoring log  (09/03/2010)    Diabetes self-management support not done because: Good outcomes  (12/31/2009)    Hypertension self-management support: BP self-monitoring log  (12/31/2009)    Hypertension self-management support not done because: Not indicated  (12/31/2009)

## 2010-10-30 NOTE — Assessment & Plan Note (Signed)
Summary: F/U Diabetes - Rx Clinic   Vital Signs:  Patient profile:   65 year old female Height:      61 inches Weight:      166 pounds BMI:     31.48 Pulse rate:   80 / minute BP sitting:   131 / 83  (left arm)  Primary Care Provider:  Cletus Gash MD   History of Present Illness: Patient arrives for Pharmacist vist to follow up after insulin change.   Patient reported new onset of back pain this AM - she describes this as a "catch" in her back.  Deferred evaluation to Dr. Luberta Mutter.  Patient brings back her blood sugar log book. She was previously drinking about 4 sodas a day but now has replaced those with crystal light.  Her CBG readings include: AM Fasting:  78-110  Prior to Lunch:consistenly in 200's   Prior to Dinner: <180     Habits & Providers  Alcohol-Tobacco-Diet     Tobacco Status: quit < 6 months     Tobacco Counseling: to remain off tobacco products  Current Medications (verified): 1)  Lantus 100 Unit/ml Soln (Insulin Glargine) .... Inject 50 Units Qam Disp 13mo Supply 2)  Truetrack Test  Strp (Glucose Blood) .... Disp Strips For Testing 1-3 Times/daily.  Disp With Appropriate Lancets As Well. Disp 1 Mo Supply 3)  Aspirin 81 Mg Chew (Aspirin) .... Once Daily 4)  Quinapril-Hydrochlorothiazide 20-12.5 Mg Tabs (Quinapril-Hydrochlorothiazide) .... 2 Once Daily 5)  Apidra Solostar 100 Unit/ml Soln (Insulin Glulisine) .... Use 8 Units Prior To Breakfast and Lunch and 12 Units Prior To Evening Meal  Allergies: 1)  * Metformin  Social History: Smoking Status:  quit < 6 months   Impression & Recommendations:  Problem # 1:  DIABETES MELLITUS II, UNCOMPLICATED ([id number])  Diabetes type 2 currently improved with addition of Apidra.  Previous A1C of 10.8 in December (this A1C was taken prior to initiation of meal time insulin). Fasting CBG's have ranged from 78-lowers 100's. Denies hypoglycemic events. Counseled on rule of 15 if she were to see any  numbers <70. Able to verbalize appropriate hypoglycemia management plan. Before lunch readings were consistently >200. Diabetes control is improving with initiation of meal time insulin. Adjusted apidra to 12 units before breakfast, 8 units prior to lunch, and 12 units prior to dinner to cover the breakfast meal better. Instructed patient to inject in abdomen to gain better absorption of insulin (she was injecting frequently in the thigh).  Written instructions provided. TTFFC: 30 minutes. Patient seen with: Mammie Lorenzo, PharmD, Mliss Sax, PharmD candidate, and Dorthey Sawyer, MD.  Her updated medication list for this problem includes:    Lantus 100 Unit/ml Soln (Insulin glargine) ..... Inject 45 units qam disp 25mo supply    Aspirin 81 Mg Chew (Aspirin) ..... Once daily    Quinapril-hydrochlorothiazide 20-12.5 Mg Tabs (Quinapril-hydrochlorothiazide) .Marland Kitchen... 2 once daily    Apidra Solostar 100 Unit/ml Soln (Insulin glulisine) ..... Use 12 units prior to breakfast and 8 units prior to lunch and 12 units prior to evening meal  Orders: Reassessment Each 15 min unit- [facility] [id number])  Problem # 2:  TOBACCO USER (ICD-305.1)  Quit for 2 months appears to be excellent candidate for continued abstinence.   Congratulated on continued abstinence from tobacco.   [id number]  Complete Medication List: 1)  Lantus 100 Unit/ml Soln (Insulin glargine) .... Inject 45 units qam disp 89mo supply 2)  Truetrack Test  Strp (Glucose blood) .... Disp strips for testing 1-3 times/daily.  disp with appropriate lancets as well. disp 1 mo supply 3)  Aspirin 81 Mg Chew (Aspirin) .... Once daily 4)  Quinapril-hydrochlorothiazide 20-12.5 Mg Tabs (Quinapril-hydrochlorothiazide) .... 2 once daily 5)  Apidra Solostar 100 Unit/ml Soln (Insulin glulisine) .... Use 12 units prior to breakfast and 8 units prior to lunch and 12 units prior to evening meal  Patient Instructions: 1)  Great job  on cutting out sodas! 2)  Take ibuprofen 600 mg q 4-6 hours as needed pain (you can buy the 200 mg over the counter, just take 3 of those) 3)  You can take 2 extra strength tylenol as well if you need it  4)  Try to inject your insulin in your stomach instead of your thigh 5)  Decrease your lantus in the morning to 45 units 6)  Increase your apidra prior to breakfast to 12 units, keep taking 8 units of apidra prior to lunch and 12 units prior to dinner  7)  If you see any sugars <70 give [name] a call 8)  F/u in 1 month with pharmacy clinic Prescriptions: APIDRA SOLOSTAR 100 UNIT/ML SOLN (INSULIN GLULISINE) Use 12 units prior to breakfast and 8 units prior to lunch AND 12 units prior to evening meal  #1 x 0   Entered by:   Mammie Lorenzo PharmD   Authorized by:   Janeann Forehand Pharm D   Signed by:   Janeann Forehand Pharm D on 10/22/2010   Method used:   Historical   [id number] LANTUS 100 UNIT/ML SOLN (INSULIN GLARGINE) Inject 45 units QAM Disp 24mo supply  #1 x 0   Entered by:   Mammie Lorenzo PharmD   Authorized by:   Janeann Forehand Pharm D   Signed by:   Janeann Forehand Pharm D on 10/22/2010   Method used:   Historical   [id number]    Orders Added: 1)  Reassessment Each 15 min unit- [facility] [id number]    Prevention & Chronic Care Immunizations   Influenza vaccine: Fluvax Non-MCR  (06/29/2008)   Influenza vaccine due: 06/29/2009    Tetanus booster: 12/29/2007: given   Tetanus booster due: 12/28/2017    Pneumococcal vaccine: Done.  (10/23/1997)   Pneumococcal vaccine due: None    H. zoster vaccine: Not documented  Colorectal Screening   Hemoccult: Done.  (06/22/2005)   Hemoccult due: Not Indicated    Colonoscopy: hyperplastic polyps, serrated adenoma without neoplasm or dysplasia.  EGD showed gastritis and h. pylori  (03/28/2008)   Colonoscopy due: 03/28/2018  Other Screening   Pap smear: Done.  (05/23/1997)   Pap smear due: Not Indicated    Mammogram:  ASSESSMENT: Negative - BI-RADS 1^MM DIGITAL SCREENING  (07/17/2010)   Mammogram due: 07/16/2010    DXA bone density scan: Not documented   Smoking status: quit < 6 months  (10/22/2010)  Diabetes Mellitus   HgbA1C: 10.8  (09/03/2010)   HgbA1C action/deferral: Ordered  (09/03/2010)   Hemoglobin A1C due: 09/03/2010    Eye exam: per patient report was normal  (04/22/2009)    Foot exam: yes  (12/31/2009)   High risk foot: Not documented   Foot care education: Not documented   Foot exam due: 03/30/2009    Urine microalbumin/creatinine ratio: Not documented    Diabetes flowsheet reviewed?: Yes   Progress toward A1C goal: Improved  Lipids   Total Cholesterol: 208  (02/26/2009)   LDL: 78  (02/26/2009)  LDL Direct: 85  (03/30/2008)   HDL: 55  (02/26/2009)   Triglycerides: 376  (02/26/2009)  Hypertension   Last Blood Pressure: 131 / 83  (10/22/2010)   Serum creatinine: 0.80  (09/26/2009)   Serum potassium 4.2  (09/26/2009)    Hypertension flowsheet reviewed?: Yes   Progress toward BP goal: At goal  Self-Management Support :   Personal Goals (by the next clinic visit) :     Personal A1C goal: 8  (05/31/2009)     Personal blood pressure goal: 130/80  (05/31/2009)     Personal LDL goal: 100  (05/31/2009)     Home glucose monitoring frequency: 3 times a day  (10/22/2010)    Diabetes self-management support: CBG self-monitoring log, Written self-care plan  (10/22/2010)   Diabetes care plan printed    Diabetes self-management support not done because: Good outcomes  (12/31/2009)    Hypertension self-management support: BP self-monitoring log  (12/31/2009)    Hypertension self-management support not done because: Not indicated  (12/31/2009)   Dr. Valentina Lucks asked me to come see pt regarding mid back pain see addendum below:  S:  Pt states that she has had mid back pain since this am.  Able to do ADL's but has a sharp pain in mid back area with twisting motion.  no fever.  no  known trauma to area.  no urine or bowel incontinence.   O: see VS above MSK- mid back left paraspinal musculature mild tenderness on palpation.   moving all 4 extremities.  full rom in torso. strength equal bilateral in all 4 ext.  A and P: musculoskeletal pain is most likely diagnosis- encouraged pt to stay active, take tylenol or motrin as needed for pain, and to return as needed if no improvement in the next 2-3 days.

## 2010-11-13 ENCOUNTER — Other Ambulatory Visit: Payer: Self-pay | Admitting: Pharmacist

## 2010-11-13 DIAGNOSIS — E119 Type 2 diabetes mellitus without complications: Secondary | ICD-10-CM

## 2010-11-13 NOTE — Telephone Encounter (Signed)
Patient came to clinic on 2/21 and was provided a short term supply of needles to use until she received supply form [place] [facility] program.  New RX called into pharmacy for 4 times daily injections. Plus 11 refills.   Called to [phone]  #[phone]   Left message.

## 2010-12-07 LAB — POCT URINALYSIS DIP (DEVICE)
Bilirubin Urine: NEGATIVE
Glucose, UA: NEGATIVE mg/dL
Hgb urine dipstick: NEGATIVE
Ketones, ur: NEGATIVE mg/dL
Nitrite: NEGATIVE
Specific Gravity, Urine: 1.025 (ref 1.005–1.030)
pH: 5.5 (ref 5.0–8.0)

## 2010-12-20 ENCOUNTER — Encounter: Payer: Self-pay | Admitting: Family Medicine

## 2010-12-20 ENCOUNTER — Ambulatory Visit (INDEPENDENT_AMBULATORY_CARE_PROVIDER_SITE_OTHER): Payer: Self-pay | Admitting: Family Medicine

## 2010-12-20 VITALS — BP 156/80 | HR 99 | Temp 98.6°F | Wt 169.1 lb

## 2010-12-20 DIAGNOSIS — IMO0001 Reserved for inherently not codable concepts without codable children: Secondary | ICD-10-CM

## 2010-12-20 DIAGNOSIS — E119 Type 2 diabetes mellitus without complications: Secondary | ICD-10-CM

## 2010-12-20 DIAGNOSIS — E669 Obesity, unspecified: Secondary | ICD-10-CM

## 2010-12-20 DIAGNOSIS — I10 Essential (primary) hypertension: Secondary | ICD-10-CM

## 2010-12-20 DIAGNOSIS — M7918 Myalgia, other site: Secondary | ICD-10-CM | POA: Insufficient documentation

## 2010-12-20 MED ORDER — INSULIN GLULISINE 100 UNIT/ML ~~LOC~~ SOLN: 12.0000 [IU] | Freq: Three times a day (TID) | SUBCUTANEOUS | Status: DC

## 2010-12-20 MED ORDER — METOPROLOL SUCCINATE ER 25 MG PO TB24: 25.0000 mg | ORAL_TABLET | Freq: Every day | ORAL | Status: DC

## 2010-12-20 NOTE — Patient Instructions (Addendum)
It was good to see you.  Your blood pressure is high today, and I am going to start a new blood pressure medicine.  If you have side effects such as dizziness, please contact the office.  Your hemoglobin A1c is 7.8 today, which is a big improvement.  I want you to take 12 units of the Apidra pen with each meal daily.  Come back in about three months to check on how you are doing with your Diabetes.  I want you to make a lab appointment for first thing in the morning some day next week to check your cholesterol.  I will also check your kidney function.  It is fine to take tylenol as directed on the bottle for your buttock pain.

## 2010-12-23 ENCOUNTER — Other Ambulatory Visit: Payer: Self-pay

## 2010-12-23 ENCOUNTER — Encounter: Payer: Self-pay | Admitting: Family Medicine

## 2010-12-23 NOTE — Progress Notes (Signed)
Subjective:     Patient ID: Anna [name], female   DOB: December 13, 1945, 65 y.o.   MRN: [id number]  HPI Pt presents for follow up of chronic medical conditions: DM- Pt has been seen in Rx clinic and had medication adjustments made.  She can explain her medication regimen to me.  She reports fasting blood sugars of 100's to 180's.  She denies any hypoglycemic episodes.    HTN- pt says she has had some elevated blood pressures at home- one as high as 180/100.  She says she had a slight headache at that time, but no vision changes, no nausea/vomiting.  She says she is taking her blood pressure medication regularly.  She has no complaints of side effects such as dizziness or weakness.  Obesity- pt says she has been trying to eat healthier- trying to eat more green vegetables.  She is unable to exercise much due to her claudication.    Buttock pain- pt complains that for several weeks she has been having pain in her left buttock.  She says sometimes it shoots down the back of her thigh.  This is different from her typical leg claudication pain.  She says it hurts when she is up walking, but especially when she is sitting on the toilet.  No incontinence, difficulty walking, LE swelling associated.   Review of Systems  Constitutional: Negative for fever.  HENT: Negative for rhinorrhea.   Respiratory: Negative for shortness of breath.   Cardiovascular: Negative for chest pain.  Genitourinary: Negative for dysuria.  Musculoskeletal: Positive for arthralgias.  Neurological: Negative for dizziness.       Objective:   Physical Exam  Eyes: Conjunctivae are normal. Pupils are equal, round, and reactive to light.  Fundoscopic exam:      The right eye shows no papilledema.       The left eye shows no papilledema.   BP 156/80  Pulse 99  Temp(Src) 98.6 F (37 C) (Oral)  Wt 169 lb 1.6 oz (76.703 kg) General appearance: alert, cooperative and no distress Nose: no discharge Throat: lips, mucosa, and  tongue normal; teeth and gums normal Lungs: clear to auscultation bilaterally Heart: regular rate and rhythm, S1, S2 normal, no murmur, click, rub or gallop Abdomen: soft, non-tender; bowel sounds normal; no masses,  no organomegaly Extremities: Pt with TTP over buttock, particularly in piriformis location.  ROM normal, sensation in tact.  Pulses: 2+ and symmetric Neurologic: Sensory: normal Motor:grossly normal    Assessment:         Plan:

## 2010-12-23 NOTE — Assessment & Plan Note (Signed)
A1c= 7.8, much better control than previous, but still room for improvement.  Will have pt take 12 units of SSI with each meal.  Continue lantus at current dose.  Follow up in 3 months.

## 2010-12-23 NOTE — Assessment & Plan Note (Addendum)
New problem- seems to be musculoskeletal, and no red flags.  Have advised taking tylenol for the pain and more exercise to loosen the muscles.

## 2010-12-23 NOTE — Assessment & Plan Note (Signed)
BP elevated, and pt had one episode of headache with severely elevated BP.  Will add Metoprolol XL for better control.  Have asked pt to keep log of blood pressures as she does her blood sugars and bring to next visit.  Reviewed that severe elevations in BP can be emergency and signs/symptoms to seek care for.

## 2010-12-23 NOTE — Assessment & Plan Note (Signed)
Pt with difficulty with exercise, and no weight loss despite diet changes.  Suggested low impact exercise.  Will check lipid panel.

## 2010-12-30 ENCOUNTER — Other Ambulatory Visit: Payer: Self-pay

## 2010-12-30 DIAGNOSIS — E669 Obesity, unspecified: Secondary | ICD-10-CM

## 2010-12-30 DIAGNOSIS — E119 Type 2 diabetes mellitus without complications: Secondary | ICD-10-CM

## 2010-12-30 DIAGNOSIS — I10 Essential (primary) hypertension: Secondary | ICD-10-CM

## 2010-12-30 LAB — LIPID PANEL
Cholesterol: 183 mg/dL (ref 0–200)
HDL: 52 mg/dL (ref >39)
Total CHOL/HDL Ratio: 3.5 Ratio
Triglycerides: 245 mg/dL — ABNORMAL HIGH (ref <150)
VLDL: 49 mg/dL — ABNORMAL HIGH (ref 0–40)

## 2010-12-30 LAB — BASIC METABOLIC PANEL
BUN: 19 mg/dL (ref 6–23)
Chloride: 97 mEq/L (ref 96–112)
Potassium: 4.1 mEq/L (ref 3.5–5.3)

## 2010-12-30 NOTE — Progress Notes (Signed)
BMP AND LIPID DONE TODAY Anna [name]

## 2011-01-02 ENCOUNTER — Encounter: Payer: Self-pay | Admitting: Family Medicine

## 2011-01-07 ENCOUNTER — Telehealth: Payer: Self-pay | Admitting: Family Medicine

## 2011-01-07 NOTE — Telephone Encounter (Signed)
Got a letter from her doctor stating she needed to start taking fish oil, but she can't find any that is the strength she recommended (3 grams)- not sure if it is supposed to be 300 grams.  Please advise

## 2011-01-07 NOTE — Telephone Encounter (Signed)
Spoke with patient, informed her that she could buy 1000mg  pills and take three a day. Patient expressed understanding.

## 2011-01-22 ENCOUNTER — Telehealth: Payer: Self-pay | Admitting: Family Medicine

## 2011-01-22 NOTE — Telephone Encounter (Signed)
States the bp med she has been taking for 3 weeks has not brought it down. Checks bp at home. 178/102 , 184/107, 174/105. States she "feels fine" but has had "sharp pains up the back of her neck into head". Also had a pain above eye recently. Denies blurry vision or HA. Has only been taking the metoprolol. Thought she was to stop the old bp med. Told her per notes md was adding the metoprolol to what she was taking. Told her to take it NOW. Call back if HA, blurred vision, lightheaded, pain. Call by Friday to report new bps. She agreed with plan

## 2011-01-22 NOTE — Telephone Encounter (Signed)
Pt was put on new BP med and she says her BP is still very high - having some pains in her head also.  Not sure if she needs to be seen but wants to talk to nurse

## 2011-02-07 NOTE — H&P (Signed)
NAMELORALYE, REFFNER            ACCOUNT NO.:  [id number]   MEDICAL RECORD NO.:  [id number]          PATIENT TYPE:  INP   LOCATION:  4711                         FACILITY:  [place]   PHYSICIAN:  Carolyn Stare, MDDATE OF BIRTH:  September 22, 1946   DATE OF ADMISSION:  07/15/2004  DATE OF DISCHARGE:                                HISTORY & PHYSICAL   PRIMARY CARE PHYSICIAN:  Lina Sar, M.D.   CHIEF COMPLAINT:  Mid chest pain.   HISTORY OF PRESENT ILLNESS:  This is a 65 year old African American female  who presented with chest pain, substernal, in the mid chest with a slight  shortness of breath. The patient states the pain increased with deep  inspiration, has nonspecific radiation, and appeared five days ago. 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 That is why she decided to come to the  ED. Also __________ husband during the last two weeks. She has a prior  history of burning pain in epigastrium similar to the actual pain that is  usually relieved with Mylanta, but it did not work this time. Also history  of GERD and she had an endoscopy done more than five years ago that showed  multiple PUD and was treated with Zantac. She denies any vomiting, diarrhea,  or nausea. No cough, no fever.   REVIEW OF SYSTEMS:  CONSTITUTIONAL: No __________ . CARDIOVASCULAR: Chest  pain. No palpitations. Mild shortness of breath. GI: Acid reflux.  PSYCHIATRIC: The patient felt depressed after a death in the family.   PAST MEDICAL HISTORY:  1.  Type 2 diabetes mellitus, uncomplicated.  2.  __________ .  3.  Hypertension, benign, systemic.  4.  History of diverticulosis of colon.  5.  History of depression.   MEDICATIONS:  1.  Aspirin 81 mg daily.  2.  Glucotrol XL 10 mg daily.  3.  __________ two times a week.  4.  Hydrochlorothiazide 25 mg daily.  5.  Lotensin 20 mg q.a.m. and q.p.m.   ALLERGIES:  No  known diagnostic allergies.   PROCEDURES DONE:  1.  Stress test--negative three years ago.  2.  ETT--in October 1999 with __________ during exercise.  3.  Ultrasound--within normal limits.  4.  Colonoscopy--in May 2001 __________ .  5.  Cardiac catheterization--marginal CAD, normal LV function in July 2001.   FAMILY HISTORY:  Mother died of CAD at 42, diabetes mellitus, and  hypertension. No family history of colon cancer.   SOCIAL HISTORY:  The patient lives with daughter and grandson. She is  single. She is a social smoker on the weekends. No alcohol consumption. No  drugs. She works as a day care center.   PHYSICAL EXAMINATION:  VITAL SIGNS: Temperature 98, respiratory rate 16,  heart rate 68 to 81, blood pressure 110/79, O2 saturation 100% on two  liters.  GENERAL: The patient is in no acute distress, well dressed, family is around  to her. She is alert and oriented times three.  HEENT: Dry mucous membranes, no redness, no jaundice. PERRL.  Intact EOMs.  Tympanic membranes are clear. No erythema or exudate identified.  NECK: Supple with no masses. Thyroid not palpable. No adenopathy.  RESPIRATORY: Clear to auscultation bilaterally.  No rales, rhonchi, or  wheezes. Good respiratory effort. No retractions.  CARDIOVASCULAR: RRR. S1 and S2 normal. No murmurs, rubs, or gallops.  Negative ABPP. No thrills.  EXTREMITIES: No edema. No cyanosis. No skin lesions. Peripheral pulses  palpable. Capillary refill less than three seconds.  ABDOMEN: Positive bowel sounds. Nontender. Negative HSM.  NEUROLOGIC: Alert and oriented times three. Cranial nerves II-XII intact.  Normal sensorium. Normal muscle strength. No cerebellar signs.   LABORATORY TESTS:  CBC reveals white count of 6.8, hemoglobin 10.3,  hematocrit 32.1, platelet count 240,000, ANC 3.1. CMP reveals sodium of 142,  potassium 3.9, chloride 106, CO2 29, BUN 11, creatinine 0.8, glucose 140,  calcium 9.2. Liver function tests reveal  AST of 25, ALT 30, alkaline  phosphatase 69, total bilirubin 0.4, total protein 6.8, albumin 3.5. D-dimer  is 0.39, __________ 38. Cardiac markers reveal myoglobin of 77.7 to 79.5, CK-  MB 2 to 2.5, troponin-I less than 0.05; this was at 16:46 and 17:50.   ECG shows flipped T wave in the inferolateral leads different from previous  ECG.   The patient received aspirin and Nexium in the ED.   ASSESSMENT:  This is a 65 year old African American female who is  complaining of chest pain, previous history of gastroesophageal reflux  disease, hypertension, type 2 diabetes mellitus uncomplicated.  1.  Chest pain. The patient has a history of GERD, has been symptomatic, but      we need to rule out any cardiac origin as MI due to patient's multiple      risk factors. Cardiac markers have been normal for now. ECG shows narrow      T waves, change in the inferolateral leads. Will repeat ECG in the      morning and add __________ times three q.8h. Will monitor her for      hemodynamic stability. __________ .  The patient is afebrile. No cough.      No history of cold or recent sick contacts. No pleuritic chest pain.      Chest x-ray pending.  2.  Hypertension. Continue hydrochlorothiazide 25 mg daily. His weight is      well controlled.  3.  Diabetes mellitus, type 2. Continue Glucotrol XL 10 mg daily. The      patient is difficult to control.     IM/MEDQ  D:  07/15/2004  T:  07/15/2004  Job:  [id number]

## 2011-02-07 NOTE — Cardiovascular Report (Signed)
[place]  Patient:    Anna [name], Anna [name]                   MRN: [id number] Proc. Date: 03/27/00 Adm. Date:  [id number] Disc. Date: [id number] Attending:  Allene Dillon CC:         Eyla Hedges, M.D.             Dorris Carnes, M.D. [facility]             Cardiac Cath Lab                        Cardiac Catheterization  PROCEDURE PERFORMED:  Left heart catheterization, coronary angiography and left ventriculography.  INDICATIONS:  [name] is a 65 year old woman with chest pain and an EKG notable for a prolonged Q-T interval.  She was referred for cardiac catheterization to rule out coronary artery disease.  PROCEDURAL NOTE:  A 6-French sheath was placed in the right femoral artery. Standard Judkins 6-French catheters were utilized.  Contrast was Omnipaque. There were no complications.  RESULTS:  HEMODYNAMICS:  Left ventricular pressure: 158/16.  Aortic pressure: 158/85. There was no aortic valve gradient.  LEFT VENTRICULOGRAM:  Wall motion was normal.  Ejection fraction calculated at 58%.  CORONARY ANGIOGRAPHY:  (Right dominant)  LEFT MAIN:  Has a smooth 20% stenosis.  LEFT ANTERIOR DESCENDING ARTERY:  Has minor luminal irregularities in the mid vessel.  It gave rise to two small diagonal branches.  LEFT CIRCUMFLEX:  Gave rise to a normal size ramus intermedius and two small to normal size obtuse marginal branches.  The left circumflex was free of angiographic disease.  RIGHT CORONARY ARTERY:  Gave rise to a large acute marginal, which supplied the distal inferior septum.  There was a small posterior descending artery and a small posterolateral branch.  The right coronary artery was free of angiographic disease.  IMPRESSION: 1. Normal left ventricular systolic function. 2. Minimal to no coronary artery disease. DD:  03/27/00 TD:  03/28/00 Job: [id number]

## 2011-02-07 NOTE — Discharge Summary (Signed)
NAMEJANELY, RAPALO NO.:  [id number]   MEDICAL RECORD NO.:  [id number]          PATIENT TYPE:  INP   LOCATION:  4711                         FACILITY:  [place]   PHYSICIAN:  Blane Ohara McDiarmid, M.D.DATE OF BIRTH:  1946/04/19   DATE OF ADMISSION:  07/15/2004  DATE OF DISCHARGE:  07/17/2004                                 DISCHARGE SUMMARY   PRIMARY CARE PHYSICIAN:  Lina Sar, M.D., [facility].   DISCHARGE DIAGNOSES:  1.  Stress related anxiety.  2.  Gastroesophageal reflux disease.  3.  Hypertension.  4.  Diabetes mellitus type 2.   DISCHARGE MEDICATIONS:  1.  Metoprolol 25 mg one pill p.o. b.i.d.  2.  Aspirin 81 mg daily.  3.  Glucotrol XL 80 mg p.o. daily.  4.  Hydrochlorothiazide 25 mg p.o. daily.  5.  Lotensin 40 mg q.a.m., 20 mg q.p.m.  6.  Protonix 40 mg p.o. daily.   HOSPITAL COURSE:  This is a 65 year old African-American female who was  admitted for a complaint of substernal chest pain with shortness of breath  as well as abdominal pain.  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  She has had increasing stress in  the last couple weeks as the father of her two youngest children as well as  a cousin had both passed away recently.  She has a longstanding history of  acid reflux and felt that pain was somewhat relieved with Mylanta.  She also  has a history of peptic ulcer disease.   Review of systems is as previously noted including depression felt after the  deaths in her family.   PROCEDURE:  2-D echocardiogram which showed normal ejection fraction of 55-  65%, akinesis of the periapical wall, akinesis of the mid, distal anterior  wall, and akinesis of the mid, distal septal wall.  Trivial aortic valvular  regurgitation, mild mitral valvular regurgitation and mild dilatation of the  left atrium.   1.  Chest/abdominal pain.  Cardiac enzymes were obtained upon admission  three times.  The troponin I was mildly elevated at 0.05 for first two      and down to 0.04 on the last set.  CK, CK-MB were normal.  EKG showed      some inverted T waves with some mildly widened QT which was new from      previous EKG.       TH/MEDQ  D:  07/17/2004  T:  07/17/2004  Job:  [id number]   cc:   Lina Sar, M.D.  [facility].  Family Prac. Resident  [place]  [place] [address]  Fax: [phone]

## 2011-02-07 NOTE — Discharge Summary (Signed)
Anna [name], Anna [name]            ACCOUNT NO.:  [id number]   MEDICAL RECORD NO.:  [id number]          PATIENT TYPE:  INP   LOCATION:  4711                         FACILITY:  [facility]   PHYSICIAN:  Blane Ohara McDiarmid, M.D.DATE OF BIRTH:  [birth year]   DATE OF ADMISSION:  07/15/2004  DATE OF DISCHARGE:  07/17/2004                                 DISCHARGE SUMMARY   ADDENDUM:  Continuation of dictation [phone].   Problem 1:  STRESS INDUCED ANXIETY.  Patient had been complaining of chest  pain, resolved throughout her hospital stay; and this was accompanied by  abdominal pain.  The patient had cardiac markers which had CK and CK/MBs  that were normal throughout her stay with a mildly elevated troponin at 0.08  that was normal on the third sets at 0.04.  A BNP revealed a level of 219.  It was followed by an echo which had results as above.  The patient denied  any chest pain on discharge.  While in hospital she was started on  metoprolol 12.5 mg b.i.d. increased to 25 mg b.i.d. on discharge.  The  __________ results can be followed up with her primary care physician.   Problem 2:  GERD.  The patient's chest pain and abdominal pain were likely  related to her GERD that was worse with her increased stress. The patient  was started on Protonix 40 mg p.o. daily and had no further symptoms related  to her abdominal pain after day 1 of admission.  The patient was discharged  with a prescription for Protonix 40 mg p.o. daily.   Problem 3:  HYPERTENSION.  The patient has a history of hypertension. She  was continued on her Lotensin 40 mg p.o. q.a.m. and 20 mg p.o. q.h.s. as  well as hydrochlorothiazide 25 mg p.o. daily.  Additionally she had been  started on metoprolol, as above, which was increased to 25 mg p.o. daily as  the result of elevated blood pressures.  The patient was stable on  discharge.   Problem 4:  DIABETES MELLITUS TYPE 2.  The patient was continued on at home  Glucotrol and her  blood sugars were mildly elevated on admission.  She may  need adjustment to her medications on follow up with her primary care  physician as an outpatient.   FOLLOW UP:  1.  Follow up appointment with Dr. Lina Sar, [facility], Tuesday, November 1 at 4 p.m.  2.  Follow up on hypertension and assessment of side effects of metoprolol.  3.  Follow up on her elevated blood sugars while in hospital.  4.  Follow up of her mental health to make sure that she and he family are      coping well with the recent death in the family.   DIET:  The patient is to follow a low salt diabetic diet.       TH/MEDQ  D:  07/17/2004  T:  07/17/2004  Job:  [id number]   cc:   Lina Sar, M.D.  [facility].  Family Prac. Resident  [facility]  [place] [address]  Fax: [phone]

## 2011-02-07 NOTE — Procedures (Signed)
[place]  Patient:    Anna [name], Anna [name]                   MRN: [id number] Proc. Date: 01/27/00 Adm. Date:  [id number] Attending:  Orvis Brill CC:         Jeryl Columbia, M.D.             William A. Andria Frames, M.D.             Rhylynn Hedges, M.D.; Medical Teaching Service                           Procedure Report  PROCEDURE:  Colonoscopy.  INDICATIONS:  Bright red blood per rectum, and polyps on flexible sigmoidoscopy.  INFORMED CONSENT:  Consent was signed after risks, benefits, methods, and options were thoroughly discussed in the office.  MEDICATIONS USED:  Demerol 60 and Versed 6.  DESCRIPTION OF PROCEDURE:  Rectal inspection was pertinent for external hemorrhoids.  Digital examination was negative.  The pediatric video colonoscope was inserted and easily advanced to the hepatic flexure, which was tortuous.  At that point, the scope began to loop.  We did have to roll her on her back, some abdominal pressure was applied, and we were able to advance to the cecum, which was identified by the appendiceal orifice and the ileocecal valve.  Other than some tiny, hyperplastic-appearing polyps on insertion, and some rare, left-sided diverticula, no other abnormalities were seen on insertion.  The scope was inserted into the terminal ileum a short way, which was normal.  Photo documentation was obtained.  The scope was slowly withdrawn.  The prep was adequate.  There was some liquid stool that required washing and suctioning.  On slow withdrawal through the colon, the cecum, ascending, and transverse were all normal.  As the scope was withdrawn around the left side of the colon, a tiny, hyperplastic-appearing, mid descending polyp was seen and cold biopsied.  An occasional left-sided diverticula were seen.  Then back in the distal sigmoid and rectum, multiple, tiny, hyperplastic-appearing polyps were seen.  The majority of which were  cold biopsied once or twice.  Once back in the rectum, the scope was retroflexed, revealing some internal hemorrhoids.  Anorectal pull-through confirmed the above.  The scope was reinserted a short ways up the sigmoid.  Air was suctioned.  The scope was removed.  The patient tolerated the procedure well. There were no obvious or immediate complications.  ENDOSCOPIC DIAGNOSES: 1. Tortuous colon, particularly in the hepatic flexure. 2. Internal-external hemorrhoids. 3. Occasional left-sided diverticula. 4. Multiple, tiny, rectosigmoid, hyperplastic-appearing polyps with one in the    descending, which was tiny as well, cold biopsied. 5. Otherwise within normal limits to the terminal ileum.  PLAN:  Await pathology to determine future colonic screening but probably recheck in five years.  Otherwise return care to Dr. Henrene Pastor in primary care for further workup and plans to see her back in 1-2 months, recheck guaiacs, etc., and to determine if any further workup plans are needed.  I am happy to see back p.r.n. DD:  01/27/00 TD:  01/28/00 Job: [id number]

## 2011-05-23 ENCOUNTER — Ambulatory Visit: Payer: Self-pay | Admitting: Family Medicine

## 2011-05-26 ENCOUNTER — Inpatient Hospital Stay (INDEPENDENT_AMBULATORY_CARE_PROVIDER_SITE_OTHER)
Admission: RE | Admit: 2011-05-26 | Discharge: 2011-05-26 | Disposition: A | Discharge status: Home / Self Care | Payer: Self-pay | Source: Ambulatory Visit | Attending: Family Medicine | Admitting: Family Medicine

## 2011-05-26 DIAGNOSIS — M659 Synovitis and tenosynovitis, unspecified: Secondary | ICD-10-CM

## 2011-06-09 ENCOUNTER — Other Ambulatory Visit: Payer: Self-pay | Admitting: Family Medicine

## 2011-06-09 DIAGNOSIS — Z1231 Encounter for screening mammogram for malignant neoplasm of breast: Secondary | ICD-10-CM

## 2011-07-08 ENCOUNTER — Encounter: Payer: Self-pay | Admitting: Family Medicine

## 2011-07-08 ENCOUNTER — Ambulatory Visit (INDEPENDENT_AMBULATORY_CARE_PROVIDER_SITE_OTHER): Payer: Self-pay | Admitting: Family Medicine

## 2011-07-08 VITALS — BP 155/93 | HR 79 | Temp 98.1°F | Ht 62.0 in | Wt 170.0 lb

## 2011-07-08 DIAGNOSIS — M79645 Pain in left finger(s): Secondary | ICD-10-CM

## 2011-07-08 DIAGNOSIS — E669 Obesity, unspecified: Secondary | ICD-10-CM

## 2011-07-08 DIAGNOSIS — M79609 Pain in unspecified limb: Secondary | ICD-10-CM

## 2011-07-08 DIAGNOSIS — I10 Essential (primary) hypertension: Secondary | ICD-10-CM

## 2011-07-08 DIAGNOSIS — E119 Type 2 diabetes mellitus without complications: Secondary | ICD-10-CM

## 2011-07-08 LAB — POCT GLYCOSYLATED HEMOGLOBIN (HGB A1C): Hemoglobin A1C: 8.5

## 2011-07-08 MED ORDER — LISINOPRIL-HYDROCHLOROTHIAZIDE 20-25 MG PO TABS: 1.0000 | ORAL_TABLET | Freq: Every day | ORAL | Status: DC

## 2011-07-08 MED ORDER — METOPROLOL SUCCINATE ER 25 MG PO TB24: 25.0000 mg | ORAL_TABLET | Freq: Every day | ORAL | Status: DC

## 2011-07-08 NOTE — Assessment & Plan Note (Signed)
Not well controlled.  Will Change accuretic to prinzide to see if this has better control.  Pt to come in for nurse visit in on week for BP check.

## 2011-07-08 NOTE — Assessment & Plan Note (Signed)
A1 c increased to 8.5 today.  Discussed importance of taking SSI routinely.  Follow up in 3 months.

## 2011-07-08 NOTE — Patient Instructions (Addendum)
It was good to see you today.  Your Hemoglobin A1C is  Lab Results  Component Value Date   HGBA1C 8.5 07/08/2011  .  Remember your goal for A1C is less than 7.  Your goal for fasting morning blood sugar is 80-120.    Your blood pressure today was BP: 155/93 mmHg.  Remember your goal blood pressure is about 120/80.  I am changing your accuretic to a different pill to see if that helps control your blood pressure, please be sure to take your medication every day.  Please make a nurse visit for one week to come in and check your blood pressure.   I want you to ice your finger for 20 minutes 2-3 times a day for the next three days.  I also want you to take three over the counter Advil three times a day for one week.  Always take it with food.  After one week you can use it as needed for pain.  Try to use the finger splint and wrap so you don't move your finger in a way that causes pain.

## 2011-07-08 NOTE — Assessment & Plan Note (Signed)
Patient has gained a few lbs since last visit, which is likely contributing to poor chronic medical disease control.  Discussed this as well as lifestyle changes.

## 2011-07-08 NOTE — Progress Notes (Signed)
  Subjective:    Patient ID: Anna [name], female    DOB: 1946/09/21, 65 y.o.   MRN: [id number]  HPI  Anna [name] comes in for followup.  Diabetes mellitus: She says that her fasting blood sugars have been running from 100-160. She says that she has been taking her Lantus 40 units in the morning. However, she is out of her Apidra for several weeks because the health department had ordered. She denies any hypoglycemic episodes, any polyuria or polydipsia.  Hypertension: Patient reports that her blood pressures have been elevated at home, systolics of Q000111Q and diastolic of 123XX123 to 0000000. She denies any headaches vision changes palpitations and chest pain or dyspnea.  Obesity: Pt has not made significant lifestyle changes since last visit.  She says she is thinking about starting an exercise program.   Finger Pain: Pt has pain in there 3rd finger of her left hand.  She says she had no injury, but it is very painful with movement and there is some swelling in the palm of her hand. No erythema or warmth.   Review of Systems Per HPI.     Objective:   Physical Exam  BP 155/93  Pulse 79  Temp(Src) 98.1 F (36.7 C) (Oral)  Ht 5\' 2"  (1.575 m)  Wt 170 lb (77.111 kg)  BMI 31.09 kg/m2 General appearance: alert, cooperative and no distress Eyes: conjunctivae/corneas clear. PERRL, EOM's intact. Fundi benign. Throat: lips, mucosa, and tongue normal; teeth and gums normal Neck: no adenopathy, supple, symmetrical, trachea midline and thyroid not enlarged, symmetric, no tenderness/mass/nodules Lungs: clear to auscultation bilaterally Heart: regular rate and rhythm, S1, S2 normal, no murmur, click, rub or gallop Extremities: Patient with swelling on palmar surface of left hand, no erythema.  She has pain with strength testing of abduction, adduction, flextion, and extention of that finger.  No locking but decreased ROM. Pulses: 2+ and symmetric Neurologic: Grossly normal      Assessment &  Plan:

## 2011-07-08 NOTE — Assessment & Plan Note (Signed)
Likely tendonitis of extensor tendon of 3rd left finger.  Pt advised of diagnosis, told to Ice it and schedule NSAIDS.  Finger immobilizer and ace wrap placed.

## 2011-07-16 ENCOUNTER — Ambulatory Visit (INDEPENDENT_AMBULATORY_CARE_PROVIDER_SITE_OTHER): Payer: Self-pay | Admitting: Family Medicine

## 2011-07-16 VITALS — BP 158/84 | HR 64

## 2011-07-16 DIAGNOSIS — I10 Essential (primary) hypertension: Secondary | ICD-10-CM

## 2011-07-16 NOTE — Progress Notes (Signed)
In for BP check today. BP checked manually using regular adult cuff. BP RA 150/88 and LA 158/84 ,pulse 64.  Patient reports is is taking meds as directed. No problems with new med. Advised will forward message to MD to please advise and ask when she needs to return.

## 2011-07-21 ENCOUNTER — Ambulatory Visit (HOSPITAL_COMMUNITY): Payer: Self-pay

## 2011-08-19 ENCOUNTER — Ambulatory Visit (HOSPITAL_COMMUNITY)
Admission: RE | Admit: 2011-08-19 | Discharge: 2011-08-19 | Disposition: A | Discharge status: Home / Self Care | Payer: Self-pay | Source: Ambulatory Visit | Attending: Family Medicine | Admitting: Family Medicine

## 2011-08-19 DIAGNOSIS — Z1231 Encounter for screening mammogram for malignant neoplasm of breast: Secondary | ICD-10-CM | POA: Insufficient documentation

## 2011-09-04 NOTE — Progress Notes (Signed)
Patient ID: Anna [name], female   DOB: 1946-07-21, 65 y.o.   MRN: [id number] Pt should come in for follow up visit for her blood pressure.  Please ask her to call office to schedule appointment.

## 2011-09-04 NOTE — Progress Notes (Signed)
Patient notified to call for appointment soon.

## 2011-09-26 ENCOUNTER — Encounter: Payer: Self-pay | Admitting: Family Medicine

## 2011-09-26 ENCOUNTER — Ambulatory Visit (INDEPENDENT_AMBULATORY_CARE_PROVIDER_SITE_OTHER): Payer: Self-pay | Admitting: Family Medicine

## 2011-09-26 VITALS — BP 166/83 | HR 87 | Temp 98.4°F | Ht 62.0 in | Wt 169.0 lb

## 2011-09-26 DIAGNOSIS — E119 Type 2 diabetes mellitus without complications: Secondary | ICD-10-CM

## 2011-09-26 DIAGNOSIS — I10 Essential (primary) hypertension: Secondary | ICD-10-CM

## 2011-09-26 DIAGNOSIS — K029 Dental caries, unspecified: Secondary | ICD-10-CM

## 2011-09-26 LAB — POCT GLYCOSYLATED HEMOGLOBIN (HGB A1C): Hemoglobin A1C: 8.1

## 2011-09-26 MED ORDER — AMLODIPINE BESYLATE 5 MG PO TABS: 5.0000 mg | ORAL_TABLET | Freq: Every day | ORAL | Status: DC

## 2011-09-26 MED ORDER — INSULIN GLULISINE 100 UNIT/ML ~~LOC~~ SOLN: SUBCUTANEOUS | Status: DC

## 2011-09-26 MED ORDER — PENICILLIN V POTASSIUM 500 MG PO TABS: 500.0000 mg | ORAL_TABLET | Freq: Three times a day (TID) | ORAL | Status: AC

## 2011-09-26 NOTE — Patient Instructions (Addendum)
It was good to see you today.  Your Hemoglobin A1C is  Lab Results  Component Value Date   HGBA1C 8.1 09/26/2011  .  Remember your goal for A1C is less than 7.  Your goal for fasting morning blood sugar is 80-120.    Your blood pressure today was BP: 166/83 mmHg.  Remember your goal blood pressure is about 120/80.  Please be sure to take your medication every day.  I am adding a blood pressure medication called Norvasc.  You should take it every day.  You should also continue the Toprol and Lisinopril/HCTZ.    I am going to put you on Penicillin for your tooth, I think this will help with your pain until you can see the dentist.   I will [facility] takes [facility] card, their phone number is [phone]. Our office will contact you with an appointment.

## 2011-09-27 DIAGNOSIS — K029 Dental caries, unspecified: Secondary | ICD-10-CM | POA: Insufficient documentation

## 2011-09-27 NOTE — Assessment & Plan Note (Signed)
Left upper molar cracked nearly all the way through and decaying, concern for infection.  Will start Penicillin as a temporizing measure and refer to dental clinic for extraction.

## 2011-09-27 NOTE — Assessment & Plan Note (Signed)
Improved today to 8.1.  Gave pt options (dietary changes and exercise, vs. Increasing Lantus. Cs increasing apidra).  Patient would like to change her diet and loose some weight, since she is close to goal and has already been able to improve her control, will try this and have her follow up in 3 months.  Did discuss diabetic diet (ie carb modified) and suggested replacing some starches with vegetables.  Pt voices understanding.

## 2011-09-27 NOTE — Progress Notes (Signed)
  Subjective:    Patient ID: Anna [name], female    DOB: April 01, 1946, 66 y.o.   MRN: [id number]  HPI  Anna [name] comes in for follow up of her DM and HTN, she is also here with a new complaint of tooth pain.    DM- she has brought a log of fasting sugars, they are ranging from 140's to 200's, with the higher numbers being around recent Holidays.  She is taking her medications as prescribed and has not had any hypoglycemic episodes.  She does admit that she eats quite a bit of starchy foods due to her fixed income as she feels this is what she can afford.   HTN- home log reflects systolic BP's Q000111Q.  Patient has no headaches or dizziness.  She is taking all her medications. No chest pain, shortness of breath.   Tooth pain- she says this has been going on at least one month.  She says she has not seen a dentist in years and years.  She is having throbbing pain, difficulty chewing.  Wants a referral to dentist.   Review of Systems Pertinent items noted in HPI.     Objective:   Physical Exam BP 166/83  Pulse 87  Temp(Src) 98.4 F (36.9 C) (Oral)  Ht 5\' 2"  (1.575 m)  Wt 169 lb (76.658 kg)  BMI 30.91 kg/m2 General appearance: alert, cooperative and no distress Nose: Nares normal. Septum midline. Mucosa normal. No drainage or sinus tenderness. Throat: pt with overall poor dentition and gingivitis.  she has an upper left molar that is cracked all the way through, with black on surface of tooth, with some surrounding gum erythema Neck: no adenopathy, supple, symmetrical, trachea midline and thyroid not enlarged, symmetric, no tenderness/mass/nodules Lungs: clear to auscultation bilaterally Heart: regular rate and rhythm, S1, S2 normal, no murmur, click, rub or gallop Abdomen: soft, non-tender; bowel sounds normal; no masses,  no organomegaly Extremities: extremities normal, atraumatic, no cyanosis or edema Pulses: 2+ and symmetric       Assessment & Plan:

## 2011-09-27 NOTE — Assessment & Plan Note (Signed)
Sill not at goal on lisinopril/hctz and metoprolol.  Will add norvasc.  Asked pt to keep log at home and reviewed BP goals. Follow up in 3 months.

## 2011-09-29 ENCOUNTER — Encounter: Payer: Self-pay | Admitting: Family Medicine

## 2011-09-29 NOTE — Telephone Encounter (Signed)
This encounter was created in error - please disregard.

## 2011-09-29 NOTE — Progress Notes (Signed)
Must be on pain meds as well as Abx before Dental Clinic will accept

## 2011-10-15 ENCOUNTER — Telehealth: Payer: Self-pay | Admitting: Family Medicine

## 2011-10-15 DIAGNOSIS — K029 Dental caries, unspecified: Secondary | ICD-10-CM

## 2011-10-15 NOTE — Telephone Encounter (Signed)
Please put in referral per your 1/4 notes regarding dental clinic appt for Anna [name].  Pt called back to say she's in a lot of pain from tooth.  Call her when appt has been sched asap.

## 2011-10-20 NOTE — Telephone Encounter (Signed)
Referral is ordered, unsure if we will be able to get her in to through [facility] or not.

## 2011-11-24 ENCOUNTER — Ambulatory Visit (INDEPENDENT_AMBULATORY_CARE_PROVIDER_SITE_OTHER): Payer: Self-pay | Admitting: Family Medicine

## 2011-11-24 ENCOUNTER — Encounter: Payer: Self-pay | Admitting: Family Medicine

## 2011-11-24 VITALS — BP 122/78 | HR 68 | Temp 97.9°F | Ht 62.0 in | Wt 167.0 lb

## 2011-11-24 DIAGNOSIS — IMO0002 Reserved for concepts with insufficient information to code with codable children: Secondary | ICD-10-CM

## 2011-11-24 MED ORDER — DOXYCYCLINE HYCLATE 100 MG PO TABS: 100.0000 mg | ORAL_TABLET | Freq: Two times a day (BID) | ORAL | Status: AC

## 2011-11-24 NOTE — Progress Notes (Signed)
  Subjective:    Patient ID: Anna [name], female    DOB: [birth year], 66 y.o.   MRN: [id number]  HPI    Review of Systems     Objective:   Physical Exam        Assessment & Plan:

## 2011-11-24 NOTE — Progress Notes (Signed)
  Subjective:    Patient ID: Anna [name], female    DOB: 04/27/1946, 66 y.o.   MRN: [id number]  HPI Anna [name] is a 66 yo F with PMHx of htn and diabetes who presents to PCP with one week history of cuticle infection.  Pt reports that about 1 week ago she had a hangnail on her left index finger.  A few days later, the area around her nail became blistered and swollen.  She reports shooting pain in her fingertip but denies itching, burning, or numbness in the area.  She denies joint pain.  Pt reports vomiting x3 times over the weekend, but thinks that this was unrelated.  Denies fever, chills, diarrhea, CP, SOB.  Pt reports that Ibuprofen helps with pain.  Pt states that she did not drain the area because she is diabetic.  Pt reports that this has never happened to her before.   Review of Systems  All other systems reviewed and are negative.       Objective:   Physical Exam  Constitutional: She appears well-developed and well-nourished. No distress.  Cardiovascular: Normal rate, regular rhythm and normal heart sounds.  Exam reveals no gallop and no friction rub.   No murmur heard. Pulmonary/Chest: Effort normal and breath sounds normal. She has no wheezes. She has no rales. She exhibits no tenderness.  Musculoskeletal:       Left index finger: small bulla surrounding cuticle, no surrounding erythema or edema, no warmth or body tenderness; normal ROM, strength 5/5, sensation intact, proprioception intact, palpable radial pulse  Skin: Skin is warm and dry.          Assessment & Plan:

## 2011-11-24 NOTE — Patient Instructions (Signed)
You have an infection under your nail cuticle.   Please wash with antibacterial soap and then soak your finger in warm water for 15 minutes up to four times daily to keep the skin soft.   Take doxycycline 100 mg two times daily for 5 days.   Please come back in 2 days so that we can check the area. If you notice worsening redness or pain in your fingertip, seek immediate care.

## 2011-11-24 NOTE — Assessment & Plan Note (Addendum)
Patient presented with bulla around cuticle of right index finger.  We loosened the cuticle with a scalpel to allow fluid to drain then applied bacitracin to the area.  Pt was instructed to wash with antibacterial soap and soak finger in warm water up to 4 times each day.  Prescribed doxycycline 100 mg po bid for 5 days.  Pt will return in 2 days for reassessment of the finger.  Agree with A/P as documented above by [place]

## 2011-11-24 NOTE — Progress Notes (Addendum)
  Subjective:    Patient ID: Marguarite Arbour, female    DOB: [birth year], 66 y.o.   MRN: [id number]  HPI  CC: 1 week of sore right index finger  HPI:  1 week for sore right index finger.  Feels it started when she picked at cuticle.  Swollen, now pus visible under skin, no drainage.  No fever, chills.    I have reviewed patient's  PMH, FH, and Social history and Medications as related to this visit. Sig for dm. Review of Systemssee HPI     Objective:   Physical Exam GEN: NAD, here with her daughter Right  index finger:  Abscess located base of nail, encompassing entire cuticle area.  No erythema, but tender to palpation and obvious pus noted under skin.  No drainage.  No ingrown nail.  No open skin wound.  Procedure note: I & D of right index finger Indication: abscess/paronychia Assisted by Gaylyn Lambert MS3  Area prepped with betadine swab x 1 and alcohol swab. Area anesthetized with ethyl chloride spray #15 blade placed just under cuticle on lateral edge, all pus able to be expressed. Patient tolerated procedure well Bacitracin and bandage applied.       Assessment & Plan:

## 2011-11-28 ENCOUNTER — Ambulatory Visit (INDEPENDENT_AMBULATORY_CARE_PROVIDER_SITE_OTHER): Payer: Self-pay | Admitting: Family Medicine

## 2011-11-28 ENCOUNTER — Encounter: Payer: Self-pay | Admitting: Family Medicine

## 2011-11-28 DIAGNOSIS — IMO0002 Reserved for concepts with insufficient information to code with codable children: Secondary | ICD-10-CM

## 2011-11-28 DIAGNOSIS — K5900 Constipation, unspecified: Secondary | ICD-10-CM | POA: Insufficient documentation

## 2011-11-28 MED ORDER — POLYETHYLENE GLYCOL 3350 17 GM/SCOOP PO POWD: 17.0000 g | Freq: Two times a day (BID) | ORAL | Status: AC | PRN

## 2011-11-28 NOTE — Patient Instructions (Signed)
The stool softener (pill) we recommend is the Colace. Miralax is the name of the powder you mix in a drink. Psyllium husks are another type of powder that can help with constipation.    Your finger looks, finish the antibiotics today.   It was good to see you!

## 2011-11-28 NOTE — Progress Notes (Signed)
  Subjective:    Patient ID: Anna [name], female    DOB: [birth year], 66 y.o.   MRN: [id number]  HPI #1. Followup for paronychia: She is about 3 days out from incision and drainage of paronychia. She states pain is much much improved. The erythema and swelling of her finger have resolved. She is very pleased with the result. She is on her last day of antibiotics which were doxycycline. She's had no fevers or chills. No abdominal pain. No nausea or vomiting.  #2. Constipation: Long time problem for patient. Patient states her last bowel movement was a week ago Thursday (roughly 7 days ago). She states her daughter had "some powder she makes a drink) but does not think this is MiraLax. I pulled up her daughter's chart which does indeed show she has several prescriptions for MiraLax written. Patient denies any abdominal distention. No nausea or vomiting but she did have episode of vomiting this past weekend. She states she has had trouble with constipation for greater than 12 years. Bowel movements are usually hard. She does roughly once a week.  The following portions of the patient's history were reviewed and updated as appropriate: allergies, current medications, past medical history, family and social history, and problem list.  Patient is a nonsmoker.   Review of Systems See HPI above for review of systems.       Objective:   Physical Exam Gen:  Alert, cooperative patient who appears stated age in no acute distress.  Vital signs reviewed. HEENT:  /AT, MMM. Cardiac:  Regular rate and rhythm without murmur auscultated.  Good S1/S2. Pulm:  Clear to auscultation bilaterally with good air movement.  No wheezes or rales noted.   Abd:  Soft/nondistended/nontender.  Good bowel sounds throughout all four quadrants.  No masses noted.  Ext:  Right index finger with improved erythema based on previous physical exam. No swelling noted. No tenderness to palpation. No red streaks or signs of infection  and hand or arm.        Assessment & Plan:

## 2011-11-28 NOTE — Assessment & Plan Note (Signed)
Provided instructions regarding treatment of constipation. Plan stool softener daily. Also MiraLax relief. She should followup in about 2 weeks so we can see how she is doing.

## 2011-11-28 NOTE — Assessment & Plan Note (Signed)
Much improved, paronychia has resolved. She is to continue to take her last dose of antibiotics today. Recommendation followup as needed.

## 2012-01-27 ENCOUNTER — Ambulatory Visit (INDEPENDENT_AMBULATORY_CARE_PROVIDER_SITE_OTHER): Payer: Self-pay | Admitting: Family Medicine

## 2012-01-27 ENCOUNTER — Ambulatory Visit: Payer: Self-pay | Admitting: Family Medicine

## 2012-01-27 ENCOUNTER — Encounter: Payer: Self-pay | Admitting: Family Medicine

## 2012-01-27 VITALS — BP 118/69 | HR 78 | Temp 98.7°F | Ht 62.0 in | Wt 166.6 lb

## 2012-01-27 DIAGNOSIS — E119 Type 2 diabetes mellitus without complications: Secondary | ICD-10-CM

## 2012-01-27 DIAGNOSIS — IMO0002 Reserved for concepts with insufficient information to code with codable children: Secondary | ICD-10-CM

## 2012-01-27 DIAGNOSIS — M7050 Other bursitis of knee, unspecified knee: Secondary | ICD-10-CM | POA: Insufficient documentation

## 2012-01-27 NOTE — Assessment & Plan Note (Signed)
Patient with elevated glucose this am, she says her fasting sugar is never at goal (80-120).  Will increase lantus to 50 units daily, and have her follow up to see if control improves.

## 2012-01-27 NOTE — Progress Notes (Signed)
  Subjective:    Patient ID: Anna [name], female    DOB: 1946-07-30, 66 y.o.   MRN: [id number]  HPI  Cherina comes in with knee pain for one week.  She denies any falls, injury, new activity.  She says there was some swelling and warmth on the lower medial side of her knee, but her daughter has rubbed down her knee which helped.  She says the pain is 6/10, and hurts worse with bending her knees far.  She says she has taken some ibuprofen, two tablets two or three times a day.  She has not iced the knee.     Review of Systems Pertinent items in HPI.     Objective:   Physical Exam BP 118/69  Pulse 78  Temp(Src) 98.7 F (37.1 C) (Oral)  Ht 5\' 2"  (1.575 m)  Wt 166 lb 9.6 oz (75.569 kg)  BMI 30.47 kg/m2 General appearance: alert, cooperative and no distress Left Knee: Normal to inspection with no erythema or effusion or obvious bony abnormalities. Palpation normal with no warmth or joint line tenderness or patellar tenderness or condyle tenderness.  However, she does have exquisite tenderness to palpation over her anserine bursa.  ROM normal in flexion and extension and lower leg rotation, but pain with flexion.  Ligaments with solid consistent endpoints including ACL, PCL, LCL, MCL. Negative Mcmurray's and provocative meniscal tests. Non painful patellar compression. Patellar and quadriceps tendons unremarkable. Hamstring and quadriceps strength is normal.       Assessment & Plan:

## 2012-01-27 NOTE — Assessment & Plan Note (Signed)
Discussed conservative management (scheduled NSAIDS, rest, and Ice), vs. Steroid injection into bursa.  Patient nervous about injection, and blood sugars still running slightly high.  She opted for conservative management, and will follow up if she does not improve to discuss injection.

## 2012-01-27 NOTE — Patient Instructions (Signed)
I think your knee pain is bursitis- or inflammation of the bursa just below your knee.  I want you to ice your knee at least 2 times a day for twenty minutes.  I also want you to take aleve two tablets twice a day for a week, then go back to using it as needed. If you are not feeling better in a week or two, please come back and see me, we can talk about doing a cortisone shot.   For your Diabetes, please increase your lantus to 50 units one a day, remember your goal blood sugar for the morning is 80-120.

## 2012-02-09 ENCOUNTER — Ambulatory Visit (INDEPENDENT_AMBULATORY_CARE_PROVIDER_SITE_OTHER): Payer: Self-pay | Admitting: Family Medicine

## 2012-02-09 ENCOUNTER — Encounter: Payer: Self-pay | Admitting: Family Medicine

## 2012-02-09 VITALS — BP 117/74 | HR 73 | Temp 98.3°F | Ht 62.0 in | Wt 164.7 lb

## 2012-02-09 DIAGNOSIS — R109 Unspecified abdominal pain: Secondary | ICD-10-CM | POA: Insufficient documentation

## 2012-02-09 DIAGNOSIS — G4700 Insomnia, unspecified: Secondary | ICD-10-CM

## 2012-02-09 DIAGNOSIS — R1013 Epigastric pain: Secondary | ICD-10-CM

## 2012-02-09 MED ORDER — OMEPRAZOLE 40 MG PO CPDR: 40.0000 mg | DELAYED_RELEASE_CAPSULE | Freq: Every day | ORAL | Status: DC

## 2012-02-09 MED ORDER — ZOLPIDEM TARTRATE 5 MG PO TABS: 5.0000 mg | ORAL_TABLET | Freq: Every evening | ORAL | Status: DC | PRN

## 2012-02-09 NOTE — Assessment & Plan Note (Signed)
Will try low dose of Ambien prn for sleep.  Also discussed increasing exercise and good sleep hygiene to help with sleep.

## 2012-02-09 NOTE — Assessment & Plan Note (Signed)
History is classic for peptic ulcer disease, and patient has history of H.pylori + ulcers.  Will rx Omeprazole 40, encouraged continuation of zantac and tums as needed.  Will refer to GI for further evaluation.

## 2012-02-09 NOTE — Patient Instructions (Signed)
It was good to see you.  I am sorry your stomach is hurting so badly.  I am starting you on omeprazole to help with your stomach pain.  I have also put in a referral for you to see the gastroenterologist.   For you sleep, try zolpidem at bedtime as needed.  Try to increase your exercise and be sure to have a quiet, relaxing routine prior to bedtime.

## 2012-02-09 NOTE — Progress Notes (Signed)
  Subjective:    Patient ID: Marguarite Arbour, female    DOB: 1946-05-13, 66 y.o.   MRN: [id number]  HPI  Ms. Naeve comes in for stomach pain that has been going on for a week.  She says that her stomach hurts her after she eats, and sometimes if she goes too long without eating.  It is a burning pain in the middle upper portion of her stomach.  She denies nausea/vomiting.  Denies changes in weight or diet.  She has tried taking some tums and zantac but they have not helped.  She is not taking a PPI.   She also complains that she is not sleeping very well.  She says she has difficulty falling and staying asleep. She denies any emotional problems or changes in her life, cannot think of anything that might be causing her difficulty sleeping. She says she feels tired and just wants a good night's rest.   PMHx: DMII, Hx of stomach ulcer in 2009, H.Pylori +biopsy, patient received treatment, but no test of cure can be found in her chart. Family History  Problem Relation Age of Onset  . Hyperlipidemia Mother   . Diabetes Mother   . Cancer Brother   . Hyperlipidemia Brother   . Diabetes Daughter    History  Substance Use Topics  . Smoking status: Former Smoker -- 0.2 packs/day for 30 years    Types: Cigarettes  . Smokeless tobacco: Not on file  . Alcohol Use: 3.6 oz/week    6 Cans of beer per week    Review of Systems Pertinent items in HPI.     Objective:   Physical Exam BP 117/74  Pulse 73  Temp(Src) 98.3 F (36.8 C) (Oral)  Ht 5\' 2"  (1.575 m)  Wt 164 lb 11.2 oz (74.707 kg)  BMI 30.12 kg/m2 General appearance: alert, cooperative and no distress Lungs: clear to auscultation bilaterally Heart: regular rate and rhythm, S1, S2 normal, no murmur, click, rub or gallop Abdomen: soft, non-tender; bowel sounds normal; no masses,  no organomegaly Extremities: extremities normal, atraumatic, no cyanosis or edema Pulses: 2+ and symmetric       Assessment & Plan:

## 2012-02-12 ENCOUNTER — Telehealth: Payer: Self-pay | Admitting: Family Medicine

## 2012-02-12 DIAGNOSIS — K219 Gastro-esophageal reflux disease without esophagitis: Secondary | ICD-10-CM

## 2012-02-12 DIAGNOSIS — R109 Unspecified abdominal pain: Secondary | ICD-10-CM

## 2012-02-12 NOTE — Telephone Encounter (Signed)
Patient was in on Monday and is calling to check the status of the referral for Gastroenterologist.

## 2012-02-13 NOTE — Telephone Encounter (Signed)
Referral ordered and Letter written.  She has seen Dr. Ardis Hughs with [facility] before.

## 2012-02-13 NOTE — Telephone Encounter (Signed)
There is no referral in chart.  Will forward to MD. Clinton Sawyer, Salome Spotted

## 2012-04-02 ENCOUNTER — Encounter: Payer: Self-pay | Admitting: Family Medicine

## 2012-04-02 ENCOUNTER — Ambulatory Visit (INDEPENDENT_AMBULATORY_CARE_PROVIDER_SITE_OTHER): Payer: Self-pay | Admitting: Family Medicine

## 2012-04-02 VITALS — BP 134/78 | HR 68 | Temp 98.2°F | Ht 62.0 in | Wt 171.9 lb

## 2012-04-02 DIAGNOSIS — R413 Other amnesia: Secondary | ICD-10-CM | POA: Insufficient documentation

## 2012-04-02 DIAGNOSIS — G4700 Insomnia, unspecified: Secondary | ICD-10-CM

## 2012-04-02 NOTE — Assessment & Plan Note (Signed)
Patient not sleeping well, this may be affecting her memory.  Gave hand out from up to date on sleep hygiene and stress management. She has not yet tried [place], which I advised to try some nights to help re-set sleep cycle.

## 2012-04-02 NOTE — Progress Notes (Signed)
  Subjective:    Patient ID: Anna [name], female    DOB: 08/19/1946, 66 y.o.   MRN: [id number]  HPI  Tabitha comes in with two of her daughters today because she has been having trouble with her memory.  She says she forgets where she puts her keys, and seems to be losing things a lot.  She also bursts into tears at small things.    She continues to not sleep well.  She says she sometimes takes a tylenol PM but she wakes up after only a few hours of sleep and cannot fall back asleep.   Review of Systems  Constitutional: Negative for chills and fatigue.  Neurological: Negative for dizziness and speech difficulty.  Hematological: Does not bruise/bleed easily.  Psychiatric/Behavioral: Positive for disturbed wake/sleep cycle. Negative for suicidal ideas and dysphoric mood.       Objective:   Physical Exam BP 134/78  Pulse 68  Temp 98.2 F (36.8 C) (Oral)  Ht 5\' 2"  (1.575 m)  Wt 171 lb 14.4 oz (77.973 kg)  BMI 31.44 kg/m2 General appearance: alert, cooperative and no distress Mini Mental Status Examination: 30/30 PHQ9: 5     Assessment & Plan:

## 2012-04-02 NOTE — Assessment & Plan Note (Signed)
Reassured patient and her daughters- Mini mental completely normal, do not think she is developing dementia.  Also, she does not seem to have depression based on PHQ9.  Most likely normal ageing with insomnia causing problems.

## 2012-05-11 ENCOUNTER — Other Ambulatory Visit: Payer: Self-pay | Admitting: Family Medicine

## 2012-05-11 MED ORDER — INSULIN GLARGINE 100 UNIT/ML ~~LOC~~ SOLN: 60.0000 [IU] | SUBCUTANEOUS | Status: DC

## 2012-05-11 NOTE — Telephone Encounter (Signed)
Faxed back lantus refill request. Patient taking 60 U daily.

## 2012-05-18 ENCOUNTER — Other Ambulatory Visit: Payer: Self-pay | Admitting: Family Medicine

## 2012-05-18 MED ORDER — LISINOPRIL-HYDROCHLOROTHIAZIDE 20-25 MG PO TABS: 1.0000 | ORAL_TABLET | Freq: Every day | ORAL | Status: DC

## 2012-05-20 ENCOUNTER — Telehealth: Payer: Self-pay | Admitting: *Deleted

## 2012-05-20 MED ORDER — QUINAPRIL-HYDROCHLOROTHIAZIDE 20-25 MG PO TABS: 1.0000 | ORAL_TABLET | Freq: Every day | ORAL | Status: DC

## 2012-05-20 NOTE — Telephone Encounter (Signed)
Dawn at the [facility] calling.  States they faxed over a request for Accuretic 20/25 for #90 last filled on 02/23/2012.  States they received a reply back on 8/28 that patient is on Prinzide.  The [facility] can not get the Prinzide for free so they are asking if we will change it to Accuretic 20/25 so they can get the medication free for the patient.  Will route to Dr. Jess Barters who is covering for Dr. Barbra Sarks this week.  Lauralyn Primes

## 2012-05-20 NOTE — Telephone Encounter (Signed)
Called Accuretic 20/25 in to the [facility] at [phone].  Lauralyn Primes

## 2012-05-20 NOTE — Telephone Encounter (Signed)
That is fine.  You can call in Accuretic 20/25mg , #90 with one refill, as now listed in the computer.

## 2012-07-08 ENCOUNTER — Encounter: Payer: Self-pay | Admitting: Family Medicine

## 2012-07-08 ENCOUNTER — Ambulatory Visit (INDEPENDENT_AMBULATORY_CARE_PROVIDER_SITE_OTHER): Payer: Self-pay | Admitting: Family Medicine

## 2012-07-08 VITALS — BP 145/87 | HR 76 | Temp 98.8°F | Ht 62.0 in | Wt 176.0 lb

## 2012-07-08 DIAGNOSIS — R51 Headache: Secondary | ICD-10-CM

## 2012-07-08 DIAGNOSIS — E119 Type 2 diabetes mellitus without complications: Secondary | ICD-10-CM

## 2012-07-08 DIAGNOSIS — I10 Essential (primary) hypertension: Secondary | ICD-10-CM

## 2012-07-08 LAB — POCT GLYCOSYLATED HEMOGLOBIN (HGB A1C): Hemoglobin A1C: 10.1

## 2012-07-08 MED ORDER — AMLODIPINE BESYLATE 5 MG PO TABS: 5.0000 mg | ORAL_TABLET | Freq: Every day | ORAL | Status: DC

## 2012-07-08 MED ORDER — KETOROLAC TROMETHAMINE 30 MG/ML IJ SOLN: 30.0000 mg | Freq: Once | INTRAMUSCULAR | Status: DC

## 2012-07-08 MED ORDER — LISINOPRIL-HYDROCHLOROTHIAZIDE 20-25 MG PO TABS: 1.0000 | ORAL_TABLET | Freq: Every day | ORAL | Status: DC

## 2012-07-08 MED ORDER — INSULIN GLULISINE 100 UNIT/ML ~~LOC~~ SOLN: SUBCUTANEOUS | Status: DC

## 2012-07-08 MED ORDER — METOPROLOL SUCCINATE ER 25 MG PO TB24: 25.0000 mg | ORAL_TABLET | Freq: Every day | ORAL | Status: DC

## 2012-07-08 MED ORDER — INSULIN GLARGINE 100 UNIT/ML ~~LOC~~ SOLN: 60.0000 [IU] | SUBCUTANEOUS | Status: DC

## 2012-07-08 NOTE — Assessment & Plan Note (Signed)
Normal neuro exam, no red flags.  Suspect uncontrolled chronic diseases may contribute.  Toradol shot today, will re-start BP medications and increase lantus.

## 2012-07-08 NOTE — Progress Notes (Signed)
  Subjective:    Patient ID: Anna [name], female    DOB: Jul 22, 1946, 66 y.o.   MRN: [id number]  HPI  Anna [name] comes in with a headache x 2 days.  She says it is on the right side of her head, and she feels like there is a wave that goes down her right arm.  She denies vision changes, nausea, vomiting, positional changes in the headache.  She does not have a history of chronic headaches.  She has taken aleve which has helped some.   HTN- ran out of norvasc, is not taking it, is taking metoprolol, not sure if she is taking an acei/HCTZ combination pill.  She denies chest pain or dyspnea or palpitations.   DM- taking lantus 60 units and apidra with meals.  Blood sugar this morning was 160.  She says is always very thirsty.    Past Medical History  Diagnosis Date  . Diabetes mellitus    Family History  Problem Relation Age of Onset  . Hyperlipidemia Mother   . Diabetes Mother   . Cancer Brother   . Hyperlipidemia Brother   . Diabetes Daughter    History  Substance Use Topics  . Smoking status: Current Some Day Smoker -- 30 years    Types: Cigarettes  . Smokeless tobacco: Not on file  . Alcohol Use: 3.6 oz/week    6 Cans of beer per week     Review of Systems See HPI    Objective:   Physical Exam BP 145/87  Pulse 76  Temp 98.8 F (37.1 C) (Oral)  Ht 5\' 2"  (1.575 m)  Wt 176 lb (79.833 kg)  BMI 32.19 kg/m2 General appearance: alert, cooperative and no distress Eyes: conjunctivae/corneas clear. PERRL, EOM's intact. Fundi benign. Neck: no JVD and supple, symmetrical, trachea midline Lungs: clear to auscultation bilaterally Heart: regular rate and rhythm, S1, S2 normal, no murmur, click, rub or gallop Neurologic: Alert and oriented X 3, normal strength and tone. Normal symmetric reflexes. Normal coordination and gait Cranial nerves: normal       Assessment & Plan:

## 2012-07-08 NOTE — Patient Instructions (Addendum)
I am sorry you are having headaches- it may be because your blood pressure is running a little high.  I have written prescriptions for all your blood pressure medications, please take all of them every day.  If your blood pressure continues to run above 140/90 please call the office for a visit.    For your Diabetes, please take 60 units of Lantus daily.    Please come back and see me in one week for your mood.

## 2012-07-08 NOTE — Assessment & Plan Note (Signed)
Poorly controlled, will increase lantus to 60 units daily, continue SSI, f/u in 3 months.

## 2012-07-08 NOTE — Assessment & Plan Note (Signed)
Mild elevation, has stopped some home medications because she ran out.  Refilled all medications, instructed to monitor BP's.

## 2012-07-09 ENCOUNTER — Other Ambulatory Visit: Payer: Self-pay | Admitting: Family Medicine

## 2012-07-09 DIAGNOSIS — Z1231 Encounter for screening mammogram for malignant neoplasm of breast: Secondary | ICD-10-CM

## 2012-07-12 ENCOUNTER — Telehealth: Payer: Self-pay | Admitting: *Deleted

## 2012-07-12 ENCOUNTER — Ambulatory Visit: Payer: Self-pay | Admitting: Family Medicine

## 2012-07-12 DIAGNOSIS — E119 Type 2 diabetes mellitus without complications: Secondary | ICD-10-CM

## 2012-07-12 MED ORDER — QUINAPRIL-HYDROCHLOROTHIAZIDE 20-25 MG PO TABS: 1.0000 | ORAL_TABLET | Freq: Every day | ORAL | Status: DC

## 2012-07-12 MED ORDER — INSULIN GLULISINE 100 UNIT/ML ~~LOC~~ SOLN: SUBCUTANEOUS | Status: DC

## 2012-07-12 NOTE — Telephone Encounter (Signed)
Scripts printed and placed in box to be faxed.

## 2012-07-12 NOTE — Telephone Encounter (Signed)
[facility] calling.  States they received prescriptions for Ms. Anna [name] for Lisinopril/HCTZ 20/25 mg.  They are unable to obtain this medication and would like the patient changed back to Accurectic 20/25 mg because they can get that for free.  Also received prescription for Apidra, 1 cartridge which is 3 ml.  It comes in a box of 5 pens (40ml).  Wants to know if it is ok to give one box instead of one cartridge.  Will need new prescriptions faxed to them if these changes are authorized by Dr. Barbra Sarks at [phone] or called to [facility] [phone].  Lauralyn Primes

## 2012-07-15 ENCOUNTER — Ambulatory Visit: Payer: Self-pay | Admitting: Family Medicine

## 2012-08-23 ENCOUNTER — Ambulatory Visit (HOSPITAL_COMMUNITY): Payer: Self-pay

## 2012-09-08 ENCOUNTER — Ambulatory Visit (HOSPITAL_COMMUNITY)
Admission: RE | Admit: 2012-09-08 | Discharge: 2012-09-08 | Disposition: A | Discharge status: Home / Self Care | Payer: No Typology Code available for payment source | Source: Ambulatory Visit | Attending: Family Medicine | Admitting: Family Medicine

## 2012-09-08 DIAGNOSIS — Z1231 Encounter for screening mammogram for malignant neoplasm of breast: Secondary | ICD-10-CM

## 2012-09-12 ENCOUNTER — Emergency Department (HOSPITAL_COMMUNITY): Payer: No Typology Code available for payment source

## 2012-09-12 ENCOUNTER — Encounter (HOSPITAL_COMMUNITY): Payer: Self-pay | Admitting: *Deleted

## 2012-09-12 ENCOUNTER — Emergency Department (HOSPITAL_COMMUNITY)
Admission: EM | Admit: 2012-09-12 | Discharge: 2012-09-12 | Disposition: A | Discharge status: Home / Self Care | Payer: No Typology Code available for payment source | Attending: Emergency Medicine | Admitting: Emergency Medicine

## 2012-09-12 DIAGNOSIS — Z794 Long term (current) use of insulin: Secondary | ICD-10-CM | POA: Insufficient documentation

## 2012-09-12 DIAGNOSIS — R509 Fever, unspecified: Secondary | ICD-10-CM | POA: Insufficient documentation

## 2012-09-12 DIAGNOSIS — I10 Essential (primary) hypertension: Secondary | ICD-10-CM | POA: Insufficient documentation

## 2012-09-12 DIAGNOSIS — Z79899 Other long term (current) drug therapy: Secondary | ICD-10-CM | POA: Insufficient documentation

## 2012-09-12 DIAGNOSIS — Z7982 Long term (current) use of aspirin: Secondary | ICD-10-CM | POA: Insufficient documentation

## 2012-09-12 DIAGNOSIS — J111 Influenza due to unidentified influenza virus with other respiratory manifestations: Secondary | ICD-10-CM

## 2012-09-12 DIAGNOSIS — J1189 Influenza due to unidentified influenza virus with other manifestations: Secondary | ICD-10-CM | POA: Insufficient documentation

## 2012-09-12 DIAGNOSIS — E119 Type 2 diabetes mellitus without complications: Secondary | ICD-10-CM | POA: Insufficient documentation

## 2012-09-12 DIAGNOSIS — F17200 Nicotine dependence, unspecified, uncomplicated: Secondary | ICD-10-CM | POA: Insufficient documentation

## 2012-09-12 HISTORY — DX: Essential (primary) hypertension: I10

## 2012-09-12 MED ORDER — HYDROCOD POLST-CHLORPHEN POLST 10-8 MG/5ML PO LQCR: 5.0000 mL | Freq: Two times a day (BID) | ORAL | Status: DC

## 2012-09-12 MED ORDER — AZITHROMYCIN 250 MG PO TABS: ORAL_TABLET | ORAL | Status: DC

## 2012-09-12 NOTE — ED Notes (Signed)
Pt is here with coughing, sneezing, and states with coughing upper abdomen hurts, and reports feeling hot.  Dry cough.   No vomiting

## 2012-09-12 NOTE — ED Notes (Signed)
Patient transported to X-ray 

## 2012-09-12 NOTE — ED Notes (Signed)
Pt. Stated, I've had a cough, runny nose since yesterday and when I cough it hurts all down and around my abdomen.

## 2012-09-12 NOTE — ED Provider Notes (Signed)
History     CSN: [id number]  Arrival date & time 09/12/12  1419   First MD Initiated Contact with Patient 09/12/12 1435      No chief complaint on file.    HPI Patient presents with 24-hour history of fever or chills and nonproductive cough.  Denies any vomiting or diarrhea.  Has had some chills.  Patient is a smoker but did not receive a flu shot this year.  Patient is also a diabetic and has hypertension. Past Medical History  Diagnosis Date  . Diabetes mellitus   . Hypertension     Past Surgical History  Procedure Date  . Abdominal hysterectomy     Family History  Problem Relation Age of Onset  . Hyperlipidemia Mother   . Diabetes Mother   . Cancer Brother   . Hyperlipidemia Brother   . Diabetes Daughter     History  Substance Use Topics  . Smoking status: Current Some Day Smoker -- 30 years    Types: Cigarettes  . Smokeless tobacco: Not on file  . Alcohol Use: 3.6 oz/week    6 Cans of beer per week    OB History    Grav Para Term Preterm Abortions TAB SAB Ect Mult Living                  Review of Systems All other systems reviewed and are negative Allergies  Metformin  Home Medications   Current Outpatient Rx  Name  Route  Sig  Dispense  Refill  . AMLODIPINE BESYLATE 5 MG PO TABS   Oral   Take 1 tablet (5 mg total) by mouth daily.   90 tablet   3   . ASPIRIN 81 MG PO TABS   Oral   Take 81 mg by mouth daily.           . INSULIN GLARGINE 100 UNIT/ML [place] SOLN   Subcutaneous   Inject 60 Units into the skin every morning.   40 mL   5   . INSULIN GLULISINE 100 UNIT/ML [place] SOLN      Use 12 units prior to breakfast and 8 units prior to lunch AND 12 units prior to evening meal   15 mL   11   . METOPROLOL SUCCINATE ER 25 MG PO TB24   Oral   Take 1 tablet (25 mg total) by mouth daily.   90 tablet   3   . OMEPRAZOLE 40 MG PO CPDR   Oral   Take 1 capsule (40 mg total) by mouth daily.   30 capsule   11   .  QUINAPRIL-HYDROCHLOROTHIAZIDE 20-25 MG PO TABS   Oral   Take 1 tablet by mouth daily.   90 tablet   3   . ZOLPIDEM TARTRATE 5 MG PO TABS   Oral   Take 1 tablet (5 mg total) by mouth at bedtime as needed for sleep.   15 tablet   2   . AZITHROMYCIN 250 MG PO TABS      Take first 2 tablets together, then 1 every day until finished.   6 tablet   0   . HYDROCOD POLST-CPM POLST ER 10-8 MG/5ML PO LQCR   Oral   Take 5 mLs by mouth every 12 (twelve) hours.   140 mL   0   . GLUCOSE BLOOD VI STRP      disp strips for testing 1-3 times/daily.  Disp with appropriate lancets as well.          Marland Kitchen  INSULIN PEN NEEDLE 31G X 8 MM MISC      Supply QS for 4 times daily dosing including Lantus and Apidra dosing.           Called to [facility] Nicoletta Dress .Marland KitchenMarland Kitchen     BP 153/72  Pulse 118  Temp 100 F (37.8 C) (Oral)  Resp 18  SpO2 97%  Physical Exam  Nursing note and vitals reviewed. Constitutional: She is oriented to person, place, and time. She appears well-developed and well-nourished. No distress.  HENT:  Head: Normocephalic and atraumatic.  Eyes: Pupils are equal, round, and reactive to light.  Neck: Normal range of motion.  Cardiovascular: Normal rate and intact distal pulses.   Pulmonary/Chest: No respiratory distress. She has no wheezes. She has no rales.  Abdominal: Normal appearance. She exhibits no distension.  Musculoskeletal: Normal range of motion.  Neurological: She is alert and oriented to person, place, and time. No cranial nerve deficit.  Skin: Skin is warm and dry. No rash noted.  Psychiatric: She has a normal mood and affect. Her behavior is normal.    ED Course  Procedures (including critical care time)  Labs Reviewed - No data to display Dg Chest 2 View  09/12/2012  *RADIOLOGY REPORT*  Clinical Data: Cough, fever, smoking history  CHEST - 2 VIEW  Comparison: Chest x-ray of 09/20/2007  Findings: No active infiltrate or effusion is seen.   Mediastinal contours are stable.  The heart is within upper limits of normal. No bony abnormality is seen.  There are degenerative changes in the lower thoracic spine.  IMPRESSION: Stable chest x-ray.  No active lung disease.   Original Report Authenticated By: Ivar Drape, M.D.      1. Influenza       MDM         Dot Lanes, MD 09/12/12 [id number]

## 2012-10-12 ENCOUNTER — Ambulatory Visit (INDEPENDENT_AMBULATORY_CARE_PROVIDER_SITE_OTHER): Payer: No Typology Code available for payment source | Admitting: *Deleted

## 2012-10-12 ENCOUNTER — Ambulatory Visit: Payer: No Typology Code available for payment source

## 2012-10-12 DIAGNOSIS — Z23 Encounter for immunization: Secondary | ICD-10-CM

## 2012-11-26 ENCOUNTER — Ambulatory Visit: Payer: No Typology Code available for payment source | Admitting: Family Medicine

## 2012-12-03 ENCOUNTER — Ambulatory Visit (INDEPENDENT_AMBULATORY_CARE_PROVIDER_SITE_OTHER): Payer: No Typology Code available for payment source | Admitting: Family Medicine

## 2012-12-03 ENCOUNTER — Encounter: Payer: Self-pay | Admitting: Family Medicine

## 2012-12-03 VITALS — BP 126/75 | HR 86 | Temp 98.6°F | Ht 62.0 in | Wt 172.0 lb

## 2012-12-03 DIAGNOSIS — G4700 Insomnia, unspecified: Secondary | ICD-10-CM

## 2012-12-03 DIAGNOSIS — F32A Depression, unspecified: Secondary | ICD-10-CM | POA: Insufficient documentation

## 2012-12-03 MED ORDER — ZOLPIDEM TARTRATE 5 MG PO TABS: 5.0000 mg | ORAL_TABLET | Freq: Every evening | ORAL | Status: DC | PRN

## 2012-12-03 MED ORDER — SERTRALINE HCL 50 MG PO TABS: 50.0000 mg | ORAL_TABLET | Freq: Every day | ORAL | Status: DC

## 2012-12-03 NOTE — Assessment & Plan Note (Signed)
Poor control, will increase SSI today.  I have asked her to keep a careful glucose log.  I will have our health coach contact her for [facility].  She is also hoping to have her retina screen done here.

## 2012-12-03 NOTE — Assessment & Plan Note (Signed)
Not meeting criteria for MDD.  However will start on zoloft and see if outbursts improve.  F/u in one month.

## 2012-12-03 NOTE — Progress Notes (Signed)
  Subjective:    Patient ID: Anna [name], female    DOB: 1945/10/24, 67 y.o.   MRN: [id number]  HPI:  DM: Patient is taking Lantus 60 units one daily and Apidra 12 units with breakfast and dinner, 8 units with lunch .  Patient is checking blood sugars.  Fasting sugars range from 120 to 150.  No hyper or hypoglycemic episodes, no polyuria or polydipsia.   HTN: Taking metoprolol, Accuretic, and Norvasc without difficulty.  Denies chest pain, dizziness, palpitations, LE edema.  Patient is not check blood pressures.   Spontaneous crying: Anna [name] complain of bursting in to tears at nothing in particular.  She says it is irritating her children, but she cannot explain it.  She denies any depressive symptoms- she says she is very happy, her daughter is getting married and she is thrilled to be involved in the wedding planning.  She does have insomnia and says she is out of [place].  She says she can fall asleep but then will wake up.  However, she denies night time worrying.    Past Medical History  Diagnosis Date  . Diabetes mellitus   . Hypertension     History  Substance Use Topics  . Smoking status: Current Some Day Smoker -- 30 years    Types: Cigarettes  . Smokeless tobacco: Not on file     Comment: smokes about 6 cigarettes per week  . Alcohol Use: 3.6 oz/week    6 Cans of beer per week    Family History  Problem Relation Age of Onset  . Hyperlipidemia Mother   . Diabetes Mother   . Cancer Brother   . Hyperlipidemia Brother   . Diabetes Daughter      ROS Pertinent items in HPI    Objective:  Physical Exam:  BP 126/75  Pulse 86  Temp(Src) 98.6 F (37 C)  Ht 5\' 2"  (1.575 m)  Wt 172 lb (78.019 kg)  BMI 31.45 kg/m2 General appearance: alert, cooperative and no distress Head: Normocephalic, without obvious abnormality, atraumatic Lungs: clear to auscultation bilaterally Heart: regular rate and rhythm, S1, S2 normal, no murmur, click, rub or gallop Pulses: 2+ and  symmetric PHQ9: 9 (3's for insomnia, poor energy, overeating).       Assessment & Plan:

## 2012-12-03 NOTE — Assessment & Plan Note (Signed)
Well controlled no changes 

## 2012-12-03 NOTE — Patient Instructions (Addendum)
It was good to see you today.  Your Hemoglobin A1C is  Lab Results  Component Value Date   HGBA1C 9.5 12/03/2012  .  Remember your goal for A1C is less than 7.  Your goal for fasting morning blood sugar is 80-120.    Lamont Dowdy, our Health coach, will call you to make an appointment about improving your blood sugar control.  She can probably do your retina eye scan that day too.   I want you to increase your Apidra to 15 units with breakfast and dinner, and 10 units at lunch.  Continue taking your lantus.  I want you to check your blood sugar before you eat in the morning, and about 2 hours after Lunch and Dinner.  Please keep a log book of this, and bring it to your appointment with the Health Coach.   You are due to have your cholesterol checked in April- please make a lab appointment for sometime after April 9th and come in fasting that morning.

## 2012-12-03 NOTE — Assessment & Plan Note (Addendum)
Will refill ambien but given other complaints I am concerned about a depressive disorder as primary cause of insomnia.

## 2012-12-06 ENCOUNTER — Telehealth: Payer: Self-pay | Admitting: *Deleted

## 2012-12-06 NOTE — Telephone Encounter (Signed)
F/u call made to schedule appt w/HC on 12/22/12. Pt reported blood sugars 118 and 124 since visit. Main barrier to healthy eating is cost of food. Has decreased sweets and carbs. Does not exercise at all d/t transportation. Declined any more info on diabetes diet and nutrition.Marland Kitchen

## 2012-12-06 NOTE — Progress Notes (Signed)
Completed [facility] documentation with Vaughan Basta.

## 2012-12-08 ENCOUNTER — Ambulatory Visit: Payer: No Typology Code available for payment source | Admitting: Family Medicine

## 2012-12-22 ENCOUNTER — Ambulatory Visit: Payer: No Typology Code available for payment source | Admitting: Home Health Services

## 2012-12-27 ENCOUNTER — Ambulatory Visit (INDEPENDENT_AMBULATORY_CARE_PROVIDER_SITE_OTHER): Payer: No Typology Code available for payment source | Admitting: Family Medicine

## 2012-12-27 ENCOUNTER — Encounter: Payer: Self-pay | Admitting: Family Medicine

## 2012-12-27 VITALS — BP 144/80 | HR 60 | Temp 98.5°F | Ht 62.0 in | Wt 170.0 lb

## 2012-12-27 DIAGNOSIS — M25519 Pain in unspecified shoulder: Secondary | ICD-10-CM

## 2012-12-27 DIAGNOSIS — H938X9 Other specified disorders of ear, unspecified ear: Secondary | ICD-10-CM

## 2012-12-27 DIAGNOSIS — M25512 Pain in left shoulder: Secondary | ICD-10-CM | POA: Insufficient documentation

## 2012-12-27 DIAGNOSIS — H938X1 Other specified disorders of right ear: Secondary | ICD-10-CM | POA: Insufficient documentation

## 2012-12-27 MED ORDER — FLUTICASONE PROPIONATE 50 MCG/ACT NA SUSP: 2.0000 | Freq: Every day | NASAL | Status: DC

## 2012-12-27 MED ORDER — MELOXICAM 15 MG PO TABS: 7.5000 mg | ORAL_TABLET | Freq: Every day | ORAL | Status: DC

## 2012-12-27 NOTE — Assessment & Plan Note (Signed)
Resolved with irrigation today.  Was not fully blocked initially- suspect has more to do with eustachian tube dysfunction.  Will rx flonase for patient to give trial if ear fullness returns.  If does not resolve, asked patient to return to see PCP, may benefit from hearing test/referal to ENT.

## 2012-12-27 NOTE — Progress Notes (Signed)
  Subjective:    Patient ID: Marguarite Arbour, female    DOB: [birth year], 67 y.o.   MRN: [id number]  HPIWork in appointment  Left shoulder pain: 1-2 weeks.  Worse at night when rolling over in bed.  radiates down right arm.  Sometimes feels a knot in back of right shoulder.  Does not work. No injuries or overuse.  No history of shoulder problems.  Less painful during day.Unclear what motions during the day exacerbate.   Has tried aleve and advil and topical salve without improvement.  Right ear feels clogged- for several weeks to months.  blowing nose relieves it for several hours.  Sounds muffled.  No cold at onset.  No ringing in ear.  Has not tried anything for this.  No trauma noted.   Review of Systems See HPI    Objective:   Physical Exam GEN: Alert & Oriented, No acute distress HEENT: [place]/AT. EOMI, PERRLA, no conjunctival injection or scleral icterus.  Bilateral tympanic membranes iobscurred by soft cerumen.  Irrigation with water reveales intact left TM, Right TM still slightly obscurred by soft cerumen .  Nares without edema or rhinorrhea.  Oropharynx is without erythema or exudates.  No anterior or posterior cervical lymphadenopathy. CV:  Regular Rate & Rhythm, no murmur Respiratory:  Normal work of breathing, CTAB Abd:  + BS, soft, no tenderness to palpation Ext: no pre-tibial edema MSK; pain on palpation of acromion, none over clavicle or biceps tendon.  Some pain on rasiing arm overhead and behind back.  decreased strength on lift off behind back and on empty can.          Assessment & Plan:

## 2012-12-27 NOTE — Assessment & Plan Note (Signed)
i suspect a mild rotator cuff tendinitis.  Will rx meloxicam and physical therapy.  If no improvement, would refer to sports medicine.  Patient monitors BP regularly and will let me know if BP worsens on short term meloxicam.

## 2012-12-27 NOTE — Patient Instructions (Addendum)
Use nasal spray to see if it helped with ear fullness.  If not improving, follow-up with primary doctor  Use meloxicam for shoulder pain.  Will refer to physical therapy.  Follow-up with primary doctor  Check home blood pressures

## 2013-01-04 ENCOUNTER — Ambulatory Visit: Payer: No Typology Code available for payment source | Admitting: Home Health Services

## 2013-01-04 ENCOUNTER — Other Ambulatory Visit: Payer: No Typology Code available for payment source

## 2013-01-05 ENCOUNTER — Ambulatory Visit: Payer: No Typology Code available for payment source | Attending: Family Medicine | Admitting: Physical Therapy

## 2013-01-05 DIAGNOSIS — R293 Abnormal posture: Secondary | ICD-10-CM | POA: Insufficient documentation

## 2013-01-05 DIAGNOSIS — IMO0001 Reserved for inherently not codable concepts without codable children: Secondary | ICD-10-CM | POA: Insufficient documentation

## 2013-01-05 DIAGNOSIS — M25519 Pain in unspecified shoulder: Secondary | ICD-10-CM | POA: Insufficient documentation

## 2013-01-12 ENCOUNTER — Ambulatory Visit: Payer: No Typology Code available for payment source | Admitting: Rehabilitation

## 2013-01-17 ENCOUNTER — Ambulatory Visit: Payer: No Typology Code available for payment source | Admitting: Physical Therapy

## 2013-01-19 ENCOUNTER — Encounter: Payer: No Typology Code available for payment source | Admitting: Rehabilitation

## 2013-01-20 ENCOUNTER — Ambulatory Visit: Payer: No Typology Code available for payment source | Admitting: Rehabilitation

## 2013-01-24 ENCOUNTER — Encounter: Payer: Self-pay | Admitting: Rehabilitation

## 2013-01-26 ENCOUNTER — Encounter: Payer: Self-pay | Admitting: Rehabilitation

## 2013-03-02 ENCOUNTER — Encounter: Payer: Self-pay | Admitting: Gastroenterology

## 2013-03-04 ENCOUNTER — Telehealth: Payer: Self-pay | Admitting: Gastroenterology

## 2013-03-04 NOTE — Telephone Encounter (Signed)
Advised Pt I would notate her chart

## 2013-03-15 ENCOUNTER — Telehealth: Payer: Self-pay | Admitting: Family Medicine

## 2013-03-15 DIAGNOSIS — E119 Type 2 diabetes mellitus without complications: Secondary | ICD-10-CM

## 2013-03-15 MED ORDER — INSULIN PEN NEEDLE 31G X 8 MM MISC: Status: DC

## 2013-03-15 NOTE — Telephone Encounter (Signed)
Rx printed, will place in fax box.

## 2013-03-15 NOTE — Telephone Encounter (Signed)
Patient is requesting a refill on her needles until a visit can be schedule in July.

## 2013-03-15 NOTE — Telephone Encounter (Signed)
Will forward to pcp

## 2013-03-16 ENCOUNTER — Telehealth: Payer: Self-pay | Admitting: Family Medicine

## 2013-03-16 NOTE — Telephone Encounter (Signed)
Patient called to let you know that when faxing information about needles it has to sent by regular fax since they do not do e-fax. JW

## 2013-03-16 NOTE — Telephone Encounter (Signed)
Prescription was sent via fax per MD.  Lazaro Arms, CMA

## 2013-03-29 ENCOUNTER — Telehealth: Payer: Self-pay | Admitting: Family Medicine

## 2013-03-29 NOTE — Telephone Encounter (Signed)
Patient wants to speak to the nurse about a new bs testing machine.  She has had her for 4 - 5 years and it is worn out.

## 2013-03-29 NOTE — Telephone Encounter (Signed)
Spoke with pt and made aware that she is due for a diabetes check up.  She can get a new rx for a machine then.  Appt was made for 04/05/2013 @11am  with Dr. Berkley Harvey.  [name],  Anna

## 2013-04-05 ENCOUNTER — Other Ambulatory Visit: Payer: Self-pay | Admitting: Family Medicine

## 2013-04-05 ENCOUNTER — Encounter: Payer: Self-pay | Admitting: Family Medicine

## 2013-04-05 ENCOUNTER — Ambulatory Visit (INDEPENDENT_AMBULATORY_CARE_PROVIDER_SITE_OTHER): Payer: No Typology Code available for payment source | Admitting: Family Medicine

## 2013-04-05 VITALS — BP 130/81 | HR 66 | Ht 62.0 in | Wt 169.8 lb

## 2013-04-05 DIAGNOSIS — E785 Hyperlipidemia, unspecified: Secondary | ICD-10-CM

## 2013-04-05 DIAGNOSIS — I10 Essential (primary) hypertension: Secondary | ICD-10-CM

## 2013-04-05 DIAGNOSIS — E119 Type 2 diabetes mellitus without complications: Secondary | ICD-10-CM

## 2013-04-05 DIAGNOSIS — K219 Gastro-esophageal reflux disease without esophagitis: Secondary | ICD-10-CM

## 2013-04-05 DIAGNOSIS — R51 Headache: Secondary | ICD-10-CM

## 2013-04-05 LAB — POCT GLYCOSYLATED HEMOGLOBIN (HGB A1C): Hemoglobin A1C: 9.1

## 2013-04-05 MED ORDER — AMLODIPINE BESYLATE 5 MG PO TABS: 5.0000 mg | ORAL_TABLET | Freq: Every day | ORAL | Status: DC

## 2013-04-05 MED ORDER — INSULIN GLARGINE 100 UNIT/ML ~~LOC~~ SOLN: 60.0000 [IU] | SUBCUTANEOUS | Status: DC

## 2013-04-05 MED ORDER — METOPROLOL SUCCINATE ER 25 MG PO TB24: 25.0000 mg | ORAL_TABLET | Freq: Every day | ORAL | Status: DC

## 2013-04-05 MED ORDER — ACURA BLOOD GLUCOSE METER W/DEVICE KIT: 1.0000 | PACK | Freq: Once | Status: DC

## 2013-04-05 MED ORDER — QUINAPRIL-HYDROCHLOROTHIAZIDE 20-25 MG PO TABS: 1.0000 | ORAL_TABLET | Freq: Every day | ORAL | Status: DC

## 2013-04-05 MED ORDER — ATORVASTATIN CALCIUM 20 MG PO TABS: 20.0000 mg | ORAL_TABLET | Freq: Every day | ORAL | Status: DC

## 2013-04-05 MED ORDER — OMEPRAZOLE 20 MG PO CPDR: 20.0000 mg | DELAYED_RELEASE_CAPSULE | Freq: Every day | ORAL | Status: DC

## 2013-04-05 MED ORDER — INSULIN PEN NEEDLE 31G X 8 MM MISC: Status: DC

## 2013-04-05 NOTE — Assessment & Plan Note (Signed)
DM remains uncontrolled today on insulin  - Only checking fasting BGs; Given Rx for new meter today   1) Started Lipitor 20mg ; due to 77yrs Cardiac risk even with LDL <100  - Order Fasting Lipids  2) Referred for Nutrition  - Pt Currently eating mostly Starchy foods, and reports little understand of which food affect her BGs 3) Will adjust Insulin at next visit   - Most likely need increased mealtime insulin, but wanted her to meet with Nutrition first

## 2013-04-05 NOTE — Progress Notes (Signed)
[facility]  Patient name: Anna [name] MRN [id number]  Date of birth: Apr 27, 1946  CC & HPI:  Anna [name] is a 67 y.o. female presenting today for multiple complaints detailed below.   GERD - She reports almost daily 7/10 sharp epigastric pain that last for several hours. It does not radiate anywhere. The pain is sometimes associated with eating, and she has noticed that alcohol can precipitate it. No alleviating factors or associated symptoms.  ROS: Denies Chest tightness, SOB, Coughing, LEE  Face pain She reports a sharp left temporal pain for the past 2 weeks. Previously she noticed the sensation of "trinkling water inside her scalp", but is concerned that there is pain involved. The pain occurred 2-3 times, usually in the morning, and immediately goes away.  - She wears a wig, but takes it off at night ROS: Denies any dec hearing or vision, jaw claudication, rash, fever, or muscle weakness   CHRONIC DIABETES  Disease Monitoring  Blood Sugar Ranges: fasting 160-200, not checking at meals  Symptoms of Hyperglycemia? no  Polyuria: no  Visual problems: no   Medication Compliance: yes  Medication Side Effects  Hypoglycemia: no   CHRONIC HYPERTENSION  Disease Monitoring  Blood pressure range: Not checking at home; well controlled today  Chest pain: no   Dyspnea: no   Claudication: no   Medication compliance: yes  Medication Side Effects  Lightheadedness: no   Urinary frequency: no   Edema: no   Preventitive Health Care  Eye Exam: She will schedule this next month once see gets her medicare  Foot Exam: Normal  Diet pattern: Large amounts of starchy foods: "Loves potatoes, Mac and cheese, Grits"  Exercise: None; reports arthritis and muscle soreness in legs    ROS:  Please See HPI above   Pertinent History Reviewed:  Medical & Surgical Hx:  Reviewed: Significant for HTN, DM, GERD, Obesity Medications & Allergies: Reviewed & Updated -  see associated section  Social History: Reviewed:   reports that she has been smoking Cigarettes.  She has been smoking about 0.00 packs per day for the past 30 years. She does not have any smokeless tobacco history on file.  History  Alcohol Use  . 3.6 oz/week  . 6 Cans of beer per week   History  Drug Use No    Objective Findings:  Vitals: BP 130/81  Pulse 66  Ht 5\' 2"  (1.575 m)  Wt 169 lb 12.8 oz (77.021 kg)  BMI 31.05 kg/m2  Gen: NAD, Obese  CV: RRR w/o m/r/g Resp: CTAB w/ normal respiratory effort LE: No edema; DP pulses 2+ bilaterally, no foot ulcers / calluses w/ good sensation  Assessment & Plan:   Please See Problem Focused Assessment & Plan

## 2013-04-05 NOTE — Assessment & Plan Note (Signed)
BP well controlled today  1) Continue current medications; updated in EPIC 2) Order CMP to assess renal/liver fcn

## 2013-04-05 NOTE — Patient Instructions (Addendum)
It was great seeing you today. Below is a list of the things we talked about:   1) Start Lipitor 20mg  every night  2) Make appointment with Nutritionist for Diabetes management  3) Take Prilosec 20mg  every morning   Please check-out at the front desk before leaving the clinic.  I look forward to talking with you again at our next visit. If you have any questions or concerns before then, please call the clinic at [phone].  See you soon,   Dr Phill Myron

## 2013-04-05 NOTE — Assessment & Plan Note (Signed)
Has had GERD previously but d/c her PPI when symptoms resolved  1) Restarted PPI today, will increase at next visit if symptoms persist 2) Discussed Behavior/Diet modifications  - No Lying down for 55mins after eating  - Alcohol and Fatty or Spicy foods may worsen symptoms  - Will discuss weight-loss at next visit if symptoms persist

## 2013-04-06 LAB — COMPREHENSIVE METABOLIC PANEL
ALT: 11 U/L (ref 0–35)
BUN: 19 mg/dL (ref 6–23)
CO2: 29 mEq/L (ref 19–32)
Calcium: 9.5 mg/dL (ref 8.4–10.5)
Chloride: 102 mEq/L (ref 96–112)
Creat: 0.94 mg/dL (ref 0.50–1.10)
Total Bilirubin: 0.5 mg/dL (ref 0.3–1.2)

## 2013-04-06 LAB — LIPID PANEL
Cholesterol: 194 mg/dL (ref 0–200)
HDL: 52 mg/dL (ref >39)
LDL Cholesterol: 95 mg/dL (ref 0–99)
Triglycerides: 237 mg/dL — ABNORMAL HIGH (ref <150)

## 2013-04-07 ENCOUNTER — Other Ambulatory Visit: Payer: Self-pay | Admitting: Family Medicine

## 2013-04-14 ENCOUNTER — Other Ambulatory Visit: Payer: Self-pay | Admitting: Family Medicine

## 2013-04-14 DIAGNOSIS — E119 Type 2 diabetes mellitus without complications: Secondary | ICD-10-CM

## 2013-05-31 ENCOUNTER — Ambulatory Visit: Payer: No Typology Code available for payment source | Admitting: Family Medicine

## 2013-06-14 ENCOUNTER — Ambulatory Visit: Payer: No Typology Code available for payment source | Admitting: Family Medicine

## 2013-06-24 ENCOUNTER — Other Ambulatory Visit: Payer: Self-pay | Admitting: Family Medicine

## 2013-06-24 ENCOUNTER — Ambulatory Visit (INDEPENDENT_AMBULATORY_CARE_PROVIDER_SITE_OTHER): Payer: Medicare Other | Admitting: Family Medicine

## 2013-06-24 ENCOUNTER — Telehealth: Payer: Self-pay | Admitting: Family Medicine

## 2013-06-24 ENCOUNTER — Encounter: Payer: Self-pay | Admitting: Family Medicine

## 2013-06-24 VITALS — BP 114/71 | HR 69 | Temp 98.6°F | Wt 169.6 lb

## 2013-06-24 DIAGNOSIS — Z23 Encounter for immunization: Secondary | ICD-10-CM

## 2013-06-24 DIAGNOSIS — F329 Major depressive disorder, single episode, unspecified: Secondary | ICD-10-CM

## 2013-06-24 DIAGNOSIS — K219 Gastro-esophageal reflux disease without esophagitis: Secondary | ICD-10-CM

## 2013-06-24 DIAGNOSIS — E119 Type 2 diabetes mellitus without complications: Secondary | ICD-10-CM

## 2013-06-24 DIAGNOSIS — I10 Essential (primary) hypertension: Secondary | ICD-10-CM

## 2013-06-24 DIAGNOSIS — F17200 Nicotine dependence, unspecified, uncomplicated: Secondary | ICD-10-CM

## 2013-06-24 MED ORDER — AMLODIPINE BESYLATE 5 MG PO TABS: 5.0000 mg | ORAL_TABLET | Freq: Every day | ORAL | Status: DC

## 2013-06-24 MED ORDER — ATORVASTATIN CALCIUM 20 MG PO TABS: 20.0000 mg | ORAL_TABLET | Freq: Every day | ORAL | Status: DC

## 2013-06-24 MED ORDER — "INSULIN SYRINGE 31G X 5/16"" 1 ML MISC": 1.0000 | Freq: Every day | Status: DC

## 2013-06-24 MED ORDER — INSULIN GLARGINE 100 UNIT/ML ~~LOC~~ SOLN: 60.0000 [IU] | SUBCUTANEOUS | Status: DC

## 2013-06-24 MED ORDER — INSULIN PEN NEEDLE 31G X 8 MM MISC: Status: DC

## 2013-06-24 MED ORDER — SERTRALINE HCL 50 MG PO TABS: 50.0000 mg | ORAL_TABLET | Freq: Every day | ORAL | Status: DC

## 2013-06-24 MED ORDER — QUINAPRIL-HYDROCHLOROTHIAZIDE 20-25 MG PO TABS: 1.0000 | ORAL_TABLET | Freq: Every day | ORAL | Status: DC

## 2013-06-24 MED ORDER — METOPROLOL SUCCINATE ER 25 MG PO TB24: 25.0000 mg | ORAL_TABLET | Freq: Every day | ORAL | Status: DC

## 2013-06-24 MED ORDER — OMEPRAZOLE 20 MG PO CPDR: 20.0000 mg | DELAYED_RELEASE_CAPSULE | Freq: Every day | ORAL | Status: DC

## 2013-06-24 NOTE — Telephone Encounter (Signed)
Patient was seen Dr. Berkley Harvey this morning, all meds sent to pharmacy EXCEPT her insulin syringes. Please call syringes in today, she is completely out.

## 2013-06-24 NOTE — Assessment & Plan Note (Signed)
A1c much improved to 7.5 today! - she has not seen nutrition yet, but has decreased starchy foods, eating more greens, and walking some - Taking Lantus 60qhs, but no meal time insulin (just forgets) - Return to clinic in 48mo for reassessment - Received Flu and Prevnar today - Is scheduled for Eye exam in November

## 2013-06-24 NOTE — Progress Notes (Signed)
[facility]  Patient name: Anna [name] MRN [id number]  Date of birth: 1946-02-10  CC & HPI:  Anna [name] is a 67 y.o. female presenting today for DM & HTN check and itching  Itching  - reports intermittent Itching in hands and feet that started ~ 3 wks ago, about same time as restarting Zoloft. Has been using OTC hydrocortisone cream will relief. No new allergen contacts, no rashes or bumps  DIABETES  Blood Sugar Ranges: 120-160  Symptoms of Hyperglycemia? No  Hypoglycemia: no  Comorbid Symptoms: no Chest pain; no SOB; no Neuropathy: no Vision problems  Medication Compliance: no, not taking meal time insulin due to forgetting   Medication Side Effects: no  Disease Monitoring  A1c: 9.1 (04/05/13)  Lipids: checked 04/05/13 started Lipitor 20 mg qd  BP: well controlled  Renal fcn: Cr wnl on 04/05/13  Counseling   Diet pattern: cut back on starchy food and eating more greens unable to make it to nutritionist yet  Exercise: walking  Smoking status: working on quiting, cutting back to every 2-3 days    [place] Exam: scheduled for 08/04/13  Foot Exam: Performed (04/05/13)  Vaccinations up to date: NO will give Flu & Prevnar today   CHRONIC HYPERTENSION BP Readings from Last 3 Encounters:  06/24/13 114/71  04/05/13 130/81  12/27/12 144/80    Disease Monitoring  Blood pressure range outside clinc: 120-140/60-80  Chest pain: no   Dyspnea: no   Claudication: yes  Medication compliance/financial difficulties: yes  Medication Side Effects: no Salt Restriction: yes  ROS:  Please See HPI above   Pertinent History Reviewed:  Medical & Surgical Hx:  Reviewed:  Medications & Allergies: Reviewed & Updated - see associated section  Social History: Reviewed:   reports that she has been smoking Cigarettes.  She has been smoking about 0.00 packs per day for the past 30 years. She does not have any smokeless tobacco history on  file.  History  Alcohol Use  . 3.6 oz/week  . 6 Cans of beer per week   History  Drug Use No    Objective Findings:  Vitals: There were no vitals taken for this visit.  Gen: NAD CV: RRR w/o m/r/g, pulses +2 b/l Resp: CTAB w/ normal respiratory effort Lower Ext: No skin changes; No edema; distal pulses intact; Calves nontender Hands&Feet: No rash, bumps or swelling  Assessment & Plan:   Please See Problem Focused Assessment & Plan

## 2013-06-24 NOTE — Assessment & Plan Note (Signed)
She has cut back and is smoking 2-3 days a week - Continue to encourage smoking cessation

## 2013-06-24 NOTE — Patient Instructions (Addendum)
It was great seeing you today. Below is a list of the things we talked about:   1) See you back in 3 months for diabetes check and see how you are doing with quitting smoking  Please check-out at the front desk before leaving the clinic.  I look forward to talking with you again at our next visit. If you have any questions or concerns before then, please call the clinic at [phone].  See you soon,   Dr Phill Myron

## 2013-06-24 NOTE — Assessment & Plan Note (Signed)
Reports having a "crying spell about 3 weeks ago out of the blue" - She denies any recent life stressors or SI - She restarted her Zoloft 50mg  independently and reports feeling better - Will continue Zoloft and reassess in 3 months

## 2013-09-20 ENCOUNTER — Other Ambulatory Visit: Payer: Self-pay | Admitting: Family Medicine

## 2013-09-20 DIAGNOSIS — Z1231 Encounter for screening mammogram for malignant neoplasm of breast: Secondary | ICD-10-CM

## 2013-10-04 ENCOUNTER — Ambulatory Visit (HOSPITAL_COMMUNITY): Payer: Medicare Other

## 2013-10-05 ENCOUNTER — Ambulatory Visit (HOSPITAL_COMMUNITY): Payer: Medicare Other

## 2013-10-14 ENCOUNTER — Ambulatory Visit (INDEPENDENT_AMBULATORY_CARE_PROVIDER_SITE_OTHER): Payer: Medicare Other | Admitting: Family Medicine

## 2013-10-14 ENCOUNTER — Encounter: Payer: Self-pay | Admitting: Family Medicine

## 2013-10-14 VITALS — BP 134/70 | HR 64 | Temp 98.5°F | Ht 62.0 in | Wt 177.0 lb

## 2013-10-14 DIAGNOSIS — M549 Dorsalgia, unspecified: Secondary | ICD-10-CM

## 2013-10-14 DIAGNOSIS — E119 Type 2 diabetes mellitus without complications: Secondary | ICD-10-CM

## 2013-10-14 LAB — POCT GLYCOSYLATED HEMOGLOBIN (HGB A1C): Hemoglobin A1C: 8.9

## 2013-10-14 MED ORDER — INSULIN GLARGINE 100 UNIT/ML ~~LOC~~ SOLN: 60.0000 [IU] | Freq: Every day | SUBCUTANEOUS | Status: DC

## 2013-10-14 MED ORDER — "INSULIN SYRINGE 31G X 5/16"" 1 ML MISC": 1.0000 | Freq: Every day | Status: DC

## 2013-10-14 NOTE — Patient Instructions (Signed)
It was great seeing you today.   1. Your A1c is High today (8.9). I'm glad you've joined a gym and are starting to exercise, as well as are dedicated to eating healthier.  I will see you back in 3 months to recheck A1c, and we will decide if medication changes are needed at that time.  2. Please let me know the results of your glaucoma screening exam next month 3. Recommend using a heating pad on your back and daily stretching to help with your muscle tension.    Please bring all your medications to every doctors visit  Sign up for My Chart to have easy access to your labs results, and communication with your Primary care physician.  Next Appointment  Please call to schedule appointment with Dr. Berkley Harvey in 3 months   I look forward to talking with you again at our next visit. If you have any questions or concerns before then, please call the clinic at [phone].  Take Care,   Dr Phill Myron

## 2013-10-14 NOTE — Progress Notes (Signed)
  Patient name: Anna [name] MRN [id number]  Date of birth: July 04, 1946  CC & HPI:  Anna [name] is a 68 y.o. female presenting today for diabetes followup, and back pain.  Back Pain: She reports right scapular back pain that is achy in nature for the last month.  The pain is intermittent and may disappear for 2-3 days before returning.  She denies any trauma or excessive coughing.  He does not recall being excessively active when this began several weeks ago.  She denies any fevers, chills, URI symptoms, chest pain, shortness of breath, or cough.  She has taken Advil with some improvement.  She just wants to be sure that she does not have "something serious."  DIABETES   Blood Sugar Ranges: Not checking at home  Symptoms of Hypoglycemia? no  Comorbid Symptoms: no Chest pain; no SOB; no Neuropathy: no Vision problems  Medication Compliance: yes   Medication Side Effects: no  ROS: See HPI above otherwise negative.  Medical & Surgical Hx:  Reviewed.  Medications & Allergies: Reviewed & Updated - see associated section Social History: Reviewed:   Objective Findings:  Vitals: BP 134/70  Pulse 64  Temp(Src) 98.5 F (36.9 C) (Oral)  Ht 5\' 2"  (1.575 m)  Wt 177 lb (80.287 kg)  BMI 32.37 kg/m2  Gen: NAD CV: RRR w/o m/r/g, pulses +2 b/l Resp: CTAB w/ normal respiratory effort Back: No rash, muscle tension  Assessment & Plan:   Please See Problem Focused Assessment & Plan

## 2013-10-14 NOTE — Assessment & Plan Note (Signed)
A1c increased to 8.9.  She reports diet noncompliance over the holidays.  She does not want to change medication today: Says she has joined a DM and is planning to start exercising, as well as will go back on her diet that resulted in her last A1c being 7.5 - No medication changes today - Reassess in 2 months: Will likely need Meal time insulin at that time

## 2013-10-24 ENCOUNTER — Ambulatory Visit (HOSPITAL_COMMUNITY)
Admission: RE | Admit: 2013-10-24 | Discharge: 2013-10-24 | Disposition: A | Discharge status: Home / Self Care | Payer: Medicare Other | Source: Ambulatory Visit | Attending: Family Medicine | Admitting: Family Medicine

## 2013-10-24 DIAGNOSIS — Z1231 Encounter for screening mammogram for malignant neoplasm of breast: Secondary | ICD-10-CM | POA: Insufficient documentation

## 2014-02-04 ENCOUNTER — Emergency Department (HOSPITAL_COMMUNITY): Payer: Medicare PPO

## 2014-02-04 ENCOUNTER — Emergency Department (HOSPITAL_COMMUNITY)
Admission: EM | Admit: 2014-02-04 | Discharge: 2014-02-04 | Disposition: A | Discharge status: Home / Self Care | Payer: Medicare PPO | Attending: Emergency Medicine | Admitting: Emergency Medicine

## 2014-02-04 ENCOUNTER — Encounter (HOSPITAL_COMMUNITY): Payer: Self-pay | Admitting: Emergency Medicine

## 2014-02-04 DIAGNOSIS — F17200 Nicotine dependence, unspecified, uncomplicated: Secondary | ICD-10-CM | POA: Insufficient documentation

## 2014-02-04 DIAGNOSIS — M545 Low back pain, unspecified: Secondary | ICD-10-CM

## 2014-02-04 DIAGNOSIS — I10 Essential (primary) hypertension: Secondary | ICD-10-CM | POA: Insufficient documentation

## 2014-02-04 DIAGNOSIS — R350 Frequency of micturition: Secondary | ICD-10-CM | POA: Insufficient documentation

## 2014-02-04 DIAGNOSIS — Z7982 Long term (current) use of aspirin: Secondary | ICD-10-CM | POA: Insufficient documentation

## 2014-02-04 DIAGNOSIS — E119 Type 2 diabetes mellitus without complications: Secondary | ICD-10-CM | POA: Insufficient documentation

## 2014-02-04 DIAGNOSIS — Z794 Long term (current) use of insulin: Secondary | ICD-10-CM | POA: Insufficient documentation

## 2014-02-04 DIAGNOSIS — Z79899 Other long term (current) drug therapy: Secondary | ICD-10-CM | POA: Insufficient documentation

## 2014-02-04 LAB — URINALYSIS, ROUTINE W REFLEX MICROSCOPIC
BILIRUBIN URINE: NEGATIVE
GLUCOSE, UA: NEGATIVE mg/dL
Hgb urine dipstick: NEGATIVE
KETONES UR: NEGATIVE mg/dL
Leukocytes, UA: NEGATIVE
Nitrite: NEGATIVE
Specific Gravity, Urine: 1.02 (ref 1.005–1.030)
Urobilinogen, UA: 1 mg/dL (ref 0.0–1.0)
pH: 5 (ref 5.0–8.0)

## 2014-02-04 LAB — URINE MICROSCOPIC-ADD ON

## 2014-02-04 MED ORDER — OXYCODONE-ACETAMINOPHEN 5-325 MG PO TABS
2.0000 | ORAL_TABLET | Freq: Once | ORAL | Status: AC
  Administered 2014-02-04: 2 via ORAL
  Filled 2014-02-04: qty 2

## 2014-02-04 MED ORDER — OXYCODONE-ACETAMINOPHEN 5-325 MG PO TABS: 1.0000 | ORAL_TABLET | ORAL | Status: DC | PRN

## 2014-02-04 NOTE — ED Notes (Signed)
Patient transported to X-ray 

## 2014-02-04 NOTE — ED Provider Notes (Signed)
CSN: [id number]     Arrival date & time 02/04/14  [id number] History   First MD Initiated Contact with Patient 02/04/14 [phone]     Chief Complaint  Patient presents with  . Back Pain     (Consider location/radiation/quality/duration/timing/severity/associated sxs/prior Treatment) HPI 68 yo female complaining of low back pain for several months now worse for one week.  Worse with movement.  NO known injury.  Pain radiates to left and right flank intermittently.  She denies numbness, tingling or weakness.  She has had some urinary frequency but denies hematuria.   Past Medical History  Diagnosis Date  . Diabetes mellitus   . Hypertension    Past Surgical History  Procedure Laterality Date  . Abdominal hysterectomy     Family History  Problem Relation Age of Onset  . Hyperlipidemia Mother   . Diabetes Mother   . Cancer Brother   . Hyperlipidemia Brother   . Diabetes Daughter    History  Substance Use Topics  . Smoking status: Current Some Day Smoker -- 30 years    Types: Cigarettes  . Smokeless tobacco: Not on file     Comment: smokes about 6 cigarettes per week  . Alcohol Use: 3.6 oz/week    6 Cans of beer per week   OB History   Grav Para Term Preterm Abortions TAB SAB Ect Mult Living                 Review of Systems  All other systems reviewed and are negative.     Allergies  Metformin  Home Medications   Prior to Admission medications   Medication Sig Start Date End Date Taking? Authorizing Provider  amLODipine (NORVASC) 5 MG tablet Take 1 tablet (5 mg total) by mouth daily. 06/24/13 06/24/14 Yes Phill Myron, MD  aspirin 81 MG tablet Take 81 mg by mouth daily.     Yes Historical Provider, MD  atorvastatin (LIPITOR) 20 MG tablet Take 1 tablet (20 mg total) by mouth daily. 06/24/13  Yes Phill Myron, MD  ibuprofen (ADVIL,MOTRIN) 200 MG tablet Take 400 mg by mouth every 6 (six) hours as needed for mild pain.   Yes Historical Provider, MD  insulin glargine (LANTUS) 100  UNIT/ML injection Inject 0.6 mLs (60 Units total) into the skin daily. 10/14/13  Yes Phill Myron, MD  metoprolol succinate (TOPROL XL) 25 MG 24 hr tablet Take 1 tablet (25 mg total) by mouth daily. 06/24/13 06/24/14 Yes Phill Myron, MD  omeprazole (PRILOSEC) 20 MG capsule Take 1 capsule (20 mg total) by mouth daily. 06/24/13  Yes Phill Myron, MD  quinapril-hydrochlorothiazide (ACCURETIC) 20-25 MG per tablet Take 1 tablet by mouth daily. 06/24/13  Yes Phill Myron, MD  sertraline (ZOLOFT) 50 MG tablet Take 1 tablet (50 mg total) by mouth daily. 06/24/13  Yes Phill Myron, MD   BP 133/83  Pulse 79  Temp(Src) 98.4 F (36.9 C) (Oral)  Resp 18  SpO2 100% Physical Exam  Nursing note and vitals reviewed. Constitutional: She is oriented to person, place, and time. She appears well-developed and well-nourished.  HENT:  Head: Normocephalic and atraumatic.  Right Ear: Tympanic membrane and external ear normal.  Left Ear: Tympanic membrane and external ear normal.  Nose: Nose normal. Right sinus exhibits no maxillary sinus tenderness and no frontal sinus tenderness. Left sinus exhibits no maxillary sinus tenderness and no frontal sinus tenderness.  Eyes: Conjunctivae and EOM are normal. Pupils are equal, round, and reactive to light. Right eye exhibits  no nystagmus. Left eye exhibits no nystagmus.  Neck: Normal range of motion. Neck supple.  Cardiovascular: Normal rate, regular rhythm, normal heart sounds and intact distal pulses.   Pulmonary/Chest: Effort normal and breath sounds normal. No respiratory distress. She exhibits no tenderness.  Abdominal: Soft. Bowel sounds are normal. She exhibits no distension and no mass. There is no tenderness.  Musculoskeletal: Normal range of motion. She exhibits no edema and no tenderness.  Neurological: She is alert and oriented to person, place, and time. She has normal strength and normal reflexes. No sensory deficit. She displays a negative Romberg sign. GCS eye  subscore is 4. GCS verbal subscore is 5. GCS motor subscore is 6.  Reflex Scores:      Tricep reflexes are 2+ on the right side and 2+ on the left side.      Bicep reflexes are 2+ on the right side and 2+ on the left side.      Brachioradialis reflexes are 2+ on the right side and 2+ on the left side.      Patellar reflexes are 2+ on the right side and 2+ on the left side.      Achilles reflexes are 2+ on the right side and 2+ on the left side. Patient with normal gait without ataxia, shuffling, spasm, or antalgia. Speech is normal without dysarthria, dysphasia, or aphasia. Muscle strength is 5/5 in bilateral shoulders, elbow flexor and extensors, wrist flexor and extensors, and intrinsic hand muscles. 5/5 bilateral lower extremity hip flexors, extensors, knee flexors and extensors, and ankle dorsi and plantar flexors.    Skin: Skin is warm and dry. No rash noted.  Psychiatric: She has a normal mood and affect. Her behavior is normal. Judgment and thought content normal.    ED Course  Procedures (including critical care time) Labs Review Labs Reviewed  URINALYSIS, ROUTINE W REFLEX MICROSCOPIC - Abnormal; Notable for the following:    APPearance CLOUDY (*)    Protein, ur >300 (*)    All other components within normal limits  URINE MICROSCOPIC-ADD ON - Abnormal; Notable for the following:    Squamous Epithelial / LPF FEW (*)    Bacteria, UA MANY (*)    Casts HYALINE CASTS (*)    All other components within normal limits    Imaging Review Dg Chest 2 View  02/04/2014   CLINICAL DATA:  Cough for 2-3 days.  Back pain.  EXAM: CHEST  2 VIEW  COMPARISON:  09/13/2012  FINDINGS: Minimal atelectasis or scarring in the left mid lung. No edema, consolidation, effusion, or pneumothorax. Normal heart size and unchanged aortic tortuosity. Diffuse spondylotic spurring of the thoracic spine.  IMPRESSION: No active cardiopulmonary disease.   Electronically Signed   By: Jorje Guild M.D.   On:  02/04/2014 09:41   Dg Lumbar Spine Complete  02/04/2014   CLINICAL DATA:  Low back pain  EXAM: LUMBAR SPINE - COMPLETE 4+ VIEW  COMPARISON:  None.  FINDINGS: Frontal, lateral, spot lumbosacral lateral, and bilateral oblique views were obtained. There are 5 non-rib-bearing lumbar type vertebral bodies. There is no fracture or spondylolisthesis. There is mild disc space narrowing at L3-4, L4-5 and L5-S1. There are anterior osteophytes at L3 and L4. There is facet osteoarthritic change at L4-5 and L5-S1 bilaterally. Calcifications in the right lower quadrant may represent calcified mesenteric lymph nodes.  IMPRESSION: Areas of osteoarthritic change. No fracture or spondylolisthesis. Probable calcified mesenteric lymph nodes in the right lower quadrant region.   Electronically Signed  By: Lowella Grip M.D.   On: 02/04/2014 09:41     EKG Interpretation None      MDM   Final diagnoses:  Low back pain   68 y.o. Female with musculoskeletal back pain- no evidence of fracture, urinalysis without evidence of blood and appears to have some contamination but not uti.  Plan symptomatic management- discussed with patient and daughter and voice understanding of return precautions and need for follow up.     Shaune Pollack, MD 02/05/14 [phone]

## 2014-02-04 NOTE — ED Notes (Signed)
Pt c/o mid thoracic back pain radiating to rt flank x 3 months.  Has been seen by doctor for same.  States that she was told that nothing is wrong.

## 2014-02-04 NOTE — Discharge Instructions (Signed)
Back Exercises Back exercises help treat and prevent back injuries. The goal of back exercises is to increase the strength of your abdominal and back muscles and the flexibility of your back. These exercises should be started when you no longer have back pain. Back exercises include:  Pelvic Tilt. Lie on your back with your knees bent. Tilt your pelvis until the lower part of your back is against the floor. Hold this position 5 to 10 sec and repeat 5 to 10 times.  Knee to Chest. Pull first 1 knee up against your chest and hold for 20 to 30 seconds, repeat this with the other knee, and then both knees. This may be done with the other leg straight or bent, whichever feels better.  Sit-Ups or Curl-Ups. Bend your knees 90 degrees. Start with tilting your pelvis, and do a partial, slow sit-up, lifting your trunk only 30 to 45 degrees off the floor. Take at least 2 to 3 seconds for each sit-up. Do not do sit-ups with your knees out straight. If partial sit-ups are difficult, simply do the above but with only tightening your abdominal muscles and holding it as directed.  Hip-Lift. Lie on your back with your knees flexed 90 degrees. Push down with your feet and shoulders as you raise your hips a couple inches off the floor; hold for 10 seconds, repeat 5 to 10 times.  Back arches. Lie on your stomach, propping yourself up on bent elbows. Slowly press on your hands, causing an arch in your low back. Repeat 3 to 5 times. Any initial stiffness and discomfort should lessen with repetition over time.  Shoulder-Lifts. Lie face down with arms beside your body. Keep hips and torso pressed to floor as you slowly lift your head and shoulders off the floor. Do not overdo your exercises, especially in the beginning. Exercises may cause you some mild back discomfort which lasts for a few minutes; however, if the pain is more severe, or lasts for more than 15 minutes, do not continue exercises until you see your caregiver.  Improvement with exercise therapy for back problems is slow.  See your caregivers for assistance with developing a proper back exercise program. Document Released: 10/16/2004 Document Revised: 12/01/2011 Document Reviewed: 07/10/2011 Exit[employer] Patient Information 2014 [employer].  

## 2014-02-14 ENCOUNTER — Ambulatory Visit: Payer: Medicare PPO | Admitting: Home Health Services

## 2014-02-14 ENCOUNTER — Ambulatory Visit (INDEPENDENT_AMBULATORY_CARE_PROVIDER_SITE_OTHER): Payer: Medicare PPO | Admitting: Family Medicine

## 2014-02-14 ENCOUNTER — Encounter: Payer: Self-pay | Admitting: Family Medicine

## 2014-02-14 VITALS — BP 130/67 | HR 74 | Temp 98.2°F | Ht 62.0 in | Wt 177.0 lb

## 2014-02-14 DIAGNOSIS — E119 Type 2 diabetes mellitus without complications: Secondary | ICD-10-CM

## 2014-02-14 LAB — POCT GLYCOSYLATED HEMOGLOBIN (HGB A1C): Hemoglobin A1C: 11

## 2014-02-14 MED ORDER — SERTRALINE HCL 50 MG PO TABS: 50.0000 mg | ORAL_TABLET | Freq: Every day | ORAL | Status: DC

## 2014-02-14 MED ORDER — CANAGLIFLOZIN 100 MG PO TABS: 100.0000 mg | ORAL_TABLET | Freq: Every day | ORAL | Status: DC

## 2014-02-14 NOTE — Progress Notes (Signed)
   Subjective:    Patient ID: Anna [name], female    DOB: [birth year], 68 y.o.   MRN: [id number]  She was scheduled for Health wellness visit today, which she was unable to complete due to failing hearing test for right ear. Right ear had large amount of wax which was removed. Her A1c was 11 today so she opted to discuss her diabetes and reschedule wellness visit.   HPI Comments: DIABETES  Blood Sugar Ranges: Reports range ~ 200s at home Symptoms of Hypoglycemia? no Comorbid Symptoms: no Chest pain; no SOB; no Neuropathy: no Vision problems Medication Compliance: yes  Medication Side Effects: none  Counseling  Diet pattern: drinks reg soda several times a day and eating sweets lately  Exercise: working out at [employer]            Review of Systems  Endocrine: Positive for polydipsia and polyuria.  Genitourinary: Positive for frequency.       Objective:   Physical Exam  Constitutional: She appears well-developed and well-nourished.  Cardiovascular: Normal rate, regular rhythm and normal heart sounds.   Pulmonary/Chest: Effort normal and breath sounds normal.   Assessment/Plan:      See Problem Focused Assessment & Plan

## 2014-02-14 NOTE — Patient Instructions (Signed)
It was great seeing you today.   1. Take Invokana every day before breakfast.  2. Cut back on regular soft drinks 3. Check your blood sugar every morning and 2 hours after your largest meal    Please bring all your medications to every doctors visit  Sign up for My Chart to have easy access to your labs results, and communication with your Primary care physician.  Next Appointment  Please call to make an appointment with Dr Berkley Harvey in 2-3 weeks   I look forward to talking with you again at our next visit. If you have any questions or concerns before then, please call the clinic at [phone].  Take Care,   Dr Phill Myron

## 2014-02-14 NOTE — Assessment & Plan Note (Signed)
-   A1c continues to increase; now 56 - She reports drinking reg soda several times a day and eating more sweets recently - discussed medication options  - She refuses to retry Metformin due to GI intolerance  - Discussed SGLT2 vs Meal-time insulin vs GLP-1 - Starting Invokana - She will cut out all regular soda and return to clinic in 2-3 weeks; Will assess BGs at that time (asked to check qam and 2hrs post largest meal)

## 2014-02-19 ENCOUNTER — Encounter: Payer: Self-pay | Admitting: Gastroenterology

## 2014-03-08 ENCOUNTER — Ambulatory Visit (INDEPENDENT_AMBULATORY_CARE_PROVIDER_SITE_OTHER): Payer: Medicare PPO | Admitting: Family Medicine

## 2014-03-08 ENCOUNTER — Encounter: Payer: Self-pay | Admitting: Family Medicine

## 2014-03-08 VITALS — BP 117/65 | HR 84 | Temp 99.2°F | Ht 62.0 in | Wt 174.0 lb

## 2014-03-08 DIAGNOSIS — Z0000 Encounter for general adult medical examination without abnormal findings: Secondary | ICD-10-CM

## 2014-03-08 DIAGNOSIS — R05 Cough: Secondary | ICD-10-CM

## 2014-03-08 DIAGNOSIS — R059 Cough, unspecified: Secondary | ICD-10-CM

## 2014-03-08 DIAGNOSIS — E119 Type 2 diabetes mellitus without complications: Secondary | ICD-10-CM

## 2014-03-08 DIAGNOSIS — R058 Other specified cough: Secondary | ICD-10-CM | POA: Insufficient documentation

## 2014-03-08 MED ORDER — AZITHROMYCIN 250 MG PO TABS: ORAL_TABLET | ORAL | Status: DC

## 2014-03-08 NOTE — Assessment & Plan Note (Signed)
Productive cough w/o fevers; O2 sat >95% DDx: Mild COPD exacerbation vs Bronchitis  - Zpack - Encouraged Her to continue to decrease smoking - Return to clinic if new worsening symptoms or SOB, chest pain

## 2014-03-08 NOTE — Progress Notes (Signed)
  Patient name: Anna [name] MRN [id number]  Date of birth: 02/09/1946  CC & HPI:  Anna [name] is a 68 y.o. female presenting today for cough and DM check.   Cough, productive  Began 5 days ago, associated with loss of voice and sore throat  Not associated with chest pain, SOB, or PND  No Viral URI, allergy or GERD symptoms  Continues to smoke, but is cutting back   No sick contact  DIABETES   Blood Sugar Ranges: 200-300  Symptoms of Hypoglycemia? No   Comorbid Symptoms: no Chest pain; SOB; Neuropathy: Vision problems  Medication Compliance: no, missed several doses of Lantus this week due to death in family. Has been taking Invokana but got letter from [facility] about needing Prior auth before refills  Medication Side Effects: none  Counseling  Diet pattern: Has cut out reg soda, and eating less Carbs  Smoking status: cutting back    ROS: See HPI above otherwise negative.  Medical & Surgical Hx:  Reviewed.  Medications & Allergies: Reviewed & Updated - see associated section Social History: Reviewed:   Objective Findings:  Vitals: BP 117/65  Pulse 84  Temp(Src) 99.2 F (37.3 C) (Oral)  Ht 5\' 2"  (1.575 m)  Wt 174 lb (78.926 kg)  BMI 31.82 kg/m2  SpO2 96%  Gen: NAD CV: RRR w/o m/r/g, pulses +2 b/l HEENT: MMM, No rhinorrhea,  Resp: Coarse upper airway sounds transmitted but no rhonchi, wheezes or rales w/ normal respiratory effort; O2 sat > 95% on RA  Assessment & Plan:   Please See Problem Focused Assessment & Plan

## 2014-03-08 NOTE — Progress Notes (Signed)
I have reviewed and agree with [facility] Cover's documentation.  Anna [name] Anna [name]

## 2014-03-08 NOTE — Assessment & Plan Note (Addendum)
BG still elevated but missed several doses of lantus due to death in family and currently fighting an infection - Encouraged lantus compliance - Continue [place] and confirmed authorization  - return to clinic in 1 month

## 2014-03-08 NOTE — Patient Instructions (Signed)
It was great seeing you today.   1. Take Azithromycin for the next 5 days 2. Return to clinic in 1 month; after your infection had cleared and you are taking lantus regularly to discuss diabetes medications/management.   Please bring all your medications to every doctors visit  Sign up for My Chart to have easy access to your labs results, and communication with your Primary care physician.  Next Appointment  Please call to make an appointment with Dr Berkley Harvey in 1 month   I look forward to talking with you again at our next visit. If you have any questions or concerns before then, please call the clinic at [phone].  Take Care,   Dr Phill Myron

## 2014-03-08 NOTE — Progress Notes (Signed)
Patient here for annual wellness visit, patient reports: Risk Factors/Conditions needing evaluation or treatment: Pt. denies having any risk factors that need evaluation.  Home Safety: Pt lives at home, by herself in a 1 story home.  Pt reports having smoke alarms and does not have adaptive equipment. Pt reports thinking about getting supports to help her get in and out of bathtub.  Other Information: Corrective lens: Pt has reading corrective lens, has annual eye exams. Pt reported having a prescription for daily glasses from her doctor but has not filled order.   Dentures: Pt does not have dentures, has annual dental exams. Memory: Pt reports having memory problems such as forgetting where she puts things down.  Patient's Mini Mental Score (recorded in doc. flowsheet): 27 Bladder:  Pt denies having problems with bladder control.  BMI/Exercise: We discussed BMI and strategies for weight loss including continuing her diet change of consuming more fresh fruits and vegetables.  We also discussed continuing her regular exercise routine. Pt reported to have recently lost 4 lbs.   ADL/IADL:  Pt reports independence in all functions. Balance/Gait: Pt reports 0 falls in the past year.  We discussed home safety and fall prevention.    Balance Abnormal Patient value  Sitting balance    Sit to stand    Attempts to arise x Using chair arms  Immediate standing balance    Standing balance    Nudge    Eyes closed- Romberg    Tandem stance    Back lean    Neck Rotation x Pt reported cramp in her neck on left side.  360 degree turn    Sitting down     Pt declined information on colonoscopy and bone density screening at this time.   Pt declined hearing screening at this time and reported having a waxing build up.     Annual Wellness Visit Requirements Recorded Today In  Medical, family, social history Past Medical, Family, Social History Section  Current providers Care team  Current medications  Medications  Wt, BP, Ht, BMI Vital signs        Tobacco, alcohol, illicit drug use History  ADL Nurse Assessment  Depression Screening Nurse Assessment  Cognitive impairment Nurse Assessment  Mini Mental Status Document Flowsheet  Fall Risk Fall/Depression  Home Safety Progress Note  End of Life Planning (welcome visit) Social Documentation  Medicare preventative services Progress Note  Risk factors/conditions needing evaluation/treatment Progress Note  Personalized health advice Patient Instructions, goals, letter  Diet & Exercise Social Documentation  Emergency Contact Social Documentation  Seat Belts Social Documentation  Sun exposure/protection Social Documentation

## 2014-03-16 ENCOUNTER — Ambulatory Visit: Payer: Medicare PPO | Admitting: Home Health Services

## 2014-04-18 ENCOUNTER — Encounter: Payer: Self-pay | Admitting: Family Medicine

## 2014-04-18 ENCOUNTER — Ambulatory Visit (INDEPENDENT_AMBULATORY_CARE_PROVIDER_SITE_OTHER): Payer: Medicare PPO | Admitting: Family Medicine

## 2014-04-18 VITALS — BP 115/76 | HR 71 | Temp 97.3°F | Resp 18 | Wt 173.0 lb

## 2014-04-18 DIAGNOSIS — K5730 Diverticulosis of large intestine without perforation or abscess without bleeding: Secondary | ICD-10-CM

## 2014-04-18 DIAGNOSIS — E119 Type 2 diabetes mellitus without complications: Secondary | ICD-10-CM

## 2014-04-18 DIAGNOSIS — I10 Essential (primary) hypertension: Secondary | ICD-10-CM

## 2014-04-18 DIAGNOSIS — K219 Gastro-esophageal reflux disease without esophagitis: Secondary | ICD-10-CM

## 2014-04-18 LAB — CBC WITH DIFFERENTIAL/PLATELET
BASOS ABS: 0.1 10*3/uL (ref 0.0–0.1)
Basophils Relative: 1 % (ref 0–1)
Eosinophils Absolute: 0 10*3/uL (ref 0.0–0.7)
Eosinophils Relative: 0 % (ref 0–5)
HEMATOCRIT: 35.6 % — AB (ref 36.0–46.0)
HEMOGLOBIN: 11.9 g/dL — AB (ref 12.0–15.0)
LYMPHS PCT: 45 % (ref 12–46)
Lymphs Abs: 3.9 10*3/uL (ref 0.7–4.0)
MCH: 27.4 pg (ref 26.0–34.0)
MCHC: 33.4 g/dL (ref 30.0–36.0)
MCV: 82 fL (ref 78.0–100.0)
MONO ABS: 0.4 10*3/uL (ref 0.1–1.0)
MONOS PCT: 5 % (ref 3–12)
Neutro Abs: 4.3 10*3/uL (ref 1.7–7.7)
Neutrophils Relative %: 49 % (ref 43–77)
Platelets: 285 10*3/uL (ref 150–400)
RBC: 4.34 MIL/uL (ref 3.87–5.11)
RDW: 14.3 % (ref 11.5–15.5)
WBC: 8.7 10*3/uL (ref 4.0–10.5)

## 2014-04-18 LAB — BASIC METABOLIC PANEL
BUN: 28 mg/dL — ABNORMAL HIGH (ref 6–23)
CO2: 26 mEq/L (ref 19–32)
Calcium: 9.2 mg/dL (ref 8.4–10.5)
Chloride: 101 mEq/L (ref 96–112)
Creat: 1.26 mg/dL — ABNORMAL HIGH (ref 0.50–1.10)
Glucose, Bld: 166 mg/dL — ABNORMAL HIGH (ref 70–99)
POTASSIUM: 3.8 meq/L (ref 3.5–5.3)
SODIUM: 135 meq/L (ref 135–145)

## 2014-04-18 MED ORDER — CANAGLIFLOZIN 100 MG PO TABS: 100.0000 mg | ORAL_TABLET | Freq: Every day | ORAL | Status: DC

## 2014-04-18 NOTE — Progress Notes (Signed)
  Patient name: Anna [name] MRN [id number]  Date of birth: 04-Sep-1946  CC & HPI:  Anna [name] is a 68 y.o. female presenting today for followup of hypertension, diabetes, and reflux.   DIABETES   Blood Sugar Ranges: 12725  Symptoms of Hypoglycemia? no  Comorbid Symptoms: no Chest pain;  SOB;  Neuropathy:  Vision problems  Medication Compliance: yes   Medication Side Effects: some dizziness immediately after taking invokana that goes away with lying down   Counseling  Diet pattern: drinks lemonade and sweet tea  CHRONIC HYPERTENSION  BP Readings from Last 3 Encounters:  04/18/14 115/76  03/08/14 117/65  02/14/14 130/67    Control: good Medication compliance: yes  Medication Side Effects: none    History  Smoking status  . Current Some Day Smoker -- 30 years  . Types: Cigarettes  Smokeless tobacco  . Not on file    Comment: smokes about 6 cigarettes per week   GERD She reports mild intermittent symptoms, and is curious if she can stop her PPI.  She takes Tums occasionally for heartburn.   ROS: See HPI above otherwise negative.  Medical & Surgical Hx:  Reviewed.  Medications & Allergies: Reviewed & Updated - see associated section Social History: Reviewed:   Objective Findings:  Vitals: BP 115/76  Pulse 71  Temp(Src) 97.3 F (36.3 C) (Oral)  Resp 18  Wt 173 lb (78.472 kg)  SpO2 98%  Gen: NAD CV: RRR w/o m/r/g, pulses +2 b/l Resp: CTAB w/ normal respiratory effort  Assessment & Plan:   Please See Problem Focused Assessment & Plan

## 2014-04-18 NOTE — Assessment & Plan Note (Signed)
Mild intermittent symptoms - Dc'd PPI today; she will use tums prn and call back if symptoms worsen

## 2014-04-18 NOTE — Assessment & Plan Note (Addendum)
BGs improving (but still having readings > 200) - Increased Invokana to 200 mg daily with instructions to increase to 300 mg daily in 2 weeks if tolerated; she has been having some lightheadedness/dizziness after taking Invokana in the morning; It goes away with lying down - f/u  In 1 month and recheck A1c; in the meantime she will work on cutting back on lemonade and sweet tea - Will discuss zostavax and Pneumovax (which she said she has received) at next visit

## 2014-04-18 NOTE — Patient Instructions (Signed)
It was great seeing you today.   1. Increase Invokana to 2 pills once a day; If you do well with this increase to 3 pills once a day in 2 weeks.    Please bring all your medications to every doctors visit  Sign up for My Chart to have easy access to your labs results, and communication with your Primary care physician.  Next Appointment  Please call to make an appointment with Dr Berkley Harvey in 1 month   I look forward to talking with you again at our next visit. If you have any questions or concerns before then, please call the clinic at [phone].  Take Care,   Dr Phill Myron

## 2014-04-18 NOTE — Assessment & Plan Note (Signed)
Given information to schedule screening colonoscopy

## 2014-04-18 NOTE — Assessment & Plan Note (Signed)
Well controlled.  - continue current therapy

## 2014-04-23 ENCOUNTER — Other Ambulatory Visit: Payer: Self-pay | Admitting: Family Medicine

## 2014-05-03 ENCOUNTER — Ambulatory Visit: Payer: Medicare PPO | Admitting: Family Medicine

## 2014-05-03 ENCOUNTER — Encounter: Payer: Self-pay | Admitting: Family Medicine

## 2014-05-04 ENCOUNTER — Telehealth: Payer: Self-pay | Admitting: Family Medicine

## 2014-05-04 NOTE — Telephone Encounter (Signed)
Pt called [facility] this morning to schedule her colonoscopy and was told that she needs a referral from Dr. Berkley Harvey before she can schedule. Please advise.

## 2014-05-15 ENCOUNTER — Other Ambulatory Visit: Payer: Self-pay | Admitting: Family Medicine

## 2014-05-15 DIAGNOSIS — Z1211 Encounter for screening for malignant neoplasm of colon: Secondary | ICD-10-CM

## 2014-05-15 NOTE — Telephone Encounter (Signed)
[facility] - GI referral made. Please call and help her schedule Colonoscopy

## 2014-05-18 ENCOUNTER — Encounter: Payer: Self-pay | Admitting: Gastroenterology

## 2014-06-08 ENCOUNTER — Ambulatory Visit: Payer: Medicare PPO | Admitting: Family Medicine

## 2014-06-12 ENCOUNTER — Encounter: Payer: Self-pay | Admitting: Family Medicine

## 2014-06-12 ENCOUNTER — Ambulatory Visit (INDEPENDENT_AMBULATORY_CARE_PROVIDER_SITE_OTHER): Payer: Medicare PPO | Admitting: Family Medicine

## 2014-06-12 VITALS — BP 126/72 | HR 79 | Temp 98.6°F | Ht 62.0 in | Wt 169.0 lb

## 2014-06-12 DIAGNOSIS — E119 Type 2 diabetes mellitus without complications: Secondary | ICD-10-CM

## 2014-06-12 DIAGNOSIS — I10 Essential (primary) hypertension: Secondary | ICD-10-CM

## 2014-06-12 DIAGNOSIS — E785 Hyperlipidemia, unspecified: Secondary | ICD-10-CM | POA: Insufficient documentation

## 2014-06-12 DIAGNOSIS — Z23 Encounter for immunization: Secondary | ICD-10-CM

## 2014-06-12 DIAGNOSIS — M25519 Pain in unspecified shoulder: Secondary | ICD-10-CM

## 2014-06-12 DIAGNOSIS — K219 Gastro-esophageal reflux disease without esophagitis: Secondary | ICD-10-CM

## 2014-06-12 DIAGNOSIS — M25511 Pain in right shoulder: Secondary | ICD-10-CM

## 2014-06-12 LAB — COMPLETE METABOLIC PANEL WITH GFR
ALT: 11 U/L (ref 0–35)
AST: 16 U/L (ref 0–37)
Albumin: 4.5 g/dL (ref 3.5–5.2)
Alkaline Phosphatase: 78 U/L (ref 39–117)
BILIRUBIN TOTAL: 0.4 mg/dL (ref 0.2–1.2)
BUN: 19 mg/dL (ref 6–23)
CO2: 32 meq/L (ref 19–32)
Calcium: 10 mg/dL (ref 8.4–10.5)
Chloride: 100 mEq/L (ref 96–112)
Creat: 1.31 mg/dL — ABNORMAL HIGH (ref 0.50–1.10)
GFR, Est African American: 49 mL/min — ABNORMAL LOW
GFR, Est Non African American: 42 mL/min — ABNORMAL LOW
Glucose, Bld: 206 mg/dL — ABNORMAL HIGH (ref 70–99)
POTASSIUM: 3.9 meq/L (ref 3.5–5.3)
SODIUM: 139 meq/L (ref 135–145)
TOTAL PROTEIN: 7.2 g/dL (ref 6.0–8.3)

## 2014-06-12 LAB — CBC
HEMATOCRIT: 39.1 % (ref 36.0–46.0)
HEMOGLOBIN: 13 g/dL (ref 12.0–15.0)
MCH: 27.5 pg (ref 26.0–34.0)
MCHC: 33.2 g/dL (ref 30.0–36.0)
MCV: 82.7 fL (ref 78.0–100.0)
Platelets: 285 10*3/uL (ref 150–400)
RBC: 4.73 MIL/uL (ref 3.87–5.11)
RDW: 14.2 % (ref 11.5–15.5)
WBC: 5 10*3/uL (ref 4.0–10.5)

## 2014-06-12 LAB — LDL CHOLESTEROL, DIRECT: LDL DIRECT: 109 mg/dL — AB

## 2014-06-12 LAB — POCT GLYCOSYLATED HEMOGLOBIN (HGB A1C): Hemoglobin A1C: 8

## 2014-06-12 MED ORDER — OMEPRAZOLE 20 MG PO CPDR: 20.0000 mg | DELAYED_RELEASE_CAPSULE | Freq: Every day | ORAL | Status: DC

## 2014-06-12 NOTE — Progress Notes (Signed)
DM:  -Med adherence: has not missed any doses of her medication -Feeling toward injections -Testing: tests once daily before meals -BG readings: 80-250; low of 80, but no symptoms of hypoglycemia -Diet: decreased appetite; 1-2 meals per day (breakfast and dinner); dinner is the larger meal -Exercise: goes to the gym [employer]; mostly does the treadmill and elliptical, and arm/waist exercises; at least twice weekly   Tobacco Use: -Willingness to quit

## 2014-06-12 NOTE — Progress Notes (Signed)
  Patient name: Anna [name] MRN [id number]  Date of birth: 1946/07/29  CC & HPI:  Anna [name] is a 68 y.o. female presenting today for Right shoulder pain, DM, HTN, HLD.   Right shoulder pain She reports right shoulder pain for 3 weeks.  Pain is greatest with lifting shoulder past 90.  Has been aggravating her at night.  Some relief with occasional ibuprofen.  Denies any recent trauma, previous shoulder injuries or surgery.  Denies any decreased range of motion.  No fevers, chills, swelling, or erythema.  DIABETES  Lab Results  Component Value Date   HGBA1C 8.0 06/12/2014    Blood Sugar Ranges: 80-250  Symptoms of Hypoglycemia? No  Comorbid Symptoms:  Denies Neuropathy;Vision problems  Medication Side Effects: Decreased appetite with Invokana  CHRONIC HYPERTENSION BP Readings from Last 3 Encounters:  06/12/14 126/72  04/18/14 115/76  03/08/14 117/65    Disease Monitoring  Chest pain: no   Dyspnea: no   Claudication: no  Medication Side Effects: Denies   HLD Lab Results  Component Value Date   LDLCALC 95 04/05/2013    Medication Compliance: Yes  Side Effects?: Denies muscle pain or weakness, RUQ pain; jaundice  Medication compliance yes   Preventitive Healthcare:  Smoking: Cut back to smoking once a week   Diet: Decreased Reg soda and sweets    Vaccinations reveiwed: flu shot given  ROS: See HPI   Medical & Surgical Hx:  Reviewed  Medications & Allergies: Reviewed  Social History: Reviewed:   Objective Findings:  Vitals: BP 126/72  Pulse 79  Temp(Src) 98.6 F (37 C) (Oral)  Ht 5\' 2"  (1.575 m)  Wt 169 lb (76.658 kg)  BMI 30.90 kg/m2  Gen: NAD CV: RRR w/o m/r/g, pulses +2 b/l Resp: CTAB w/ normal respiratory effort Shoulder: Inspection reveals no abnormalities, atrophy or asymmetry. Palpation is normal with no tenderness over AC joint or bicipital groove. ROM is full in all planes. Rotator cuff strength normal throughout. Positive  empty can sign. Painful arc sign present  Assessment & Plan:   Please See Problem Focused Assessment & Plan

## 2014-06-12 NOTE — Patient Instructions (Addendum)
It was great seeing you today.   1. Continue on your current dose of Invokana; we may be able to increase this at your next visit 2. Do shoulder exercises 4-5 days a weeks; Use tylenol as needed for pain; Apply heat to shoulder as needed 3. I'm checking some blood work today and will call you if anything is abnormal 4. Please bring you meter and medications to your next visit   Please bring all your medications to every doctors visit  Sign up for My Chart to have easy access to your labs results, and communication with your Primary care physician.  Next Appointment  Please make an appointment with Dr Berkley Harvey in 1 month   I look forward to talking with you again at our next visit. If you have any questions or concerns before then, please call the clinic at [phone].  Take Care,   Dr Phill Myron  Shoulder Range of Motion Exercises The shoulder is the most flexible joint in the human body. Because of this it is also the most unstable joint in the body. All ages can develop shoulder problems. Early treatment of problems is necessary for a good outcome. People react to shoulder pain by decreasing the movement of the joint. After a brief period of time, the shoulder can become "frozen". This is an almost complete loss of the ability to move the damaged shoulder. Following injuries your caregivers can give you instructions on exercises to keep your range of motion (ability to move your shoulder freely), or regain it if it has been lost.  [place]: Codman's Exercise or Pendulum Exercise  This exercise may be performed in a prone (face-down) lying position or standing while leaning on a chair with the opposite arm. Its purpose is to relax the muscles in your shoulder and slowly but surely increase the range of motion and to relieve pain.  Lie on your stomach close to the side edge of the bed. Let your weak arm hang over the edge of the  bed. Relax your shoulder, arm and hand. Let your shoulder blade relax and drop down.  Slowly and gently swing your arm forward and back. Do not use your neck muscles; relax them. It might be easier to have someone else gently start swinging your arm.  As pain decreases, increase your swing. To start, arm swing should begin at 15 degree angles. In time and as pain lessens, move to 30-45 degree angles. Start with swinging for about 15 seconds, and work towards swinging for 3 to 5 minutes.  This exercise may also be performed in a standing/bent over position.  Stand and hold onto a sturdy chair with your good arm. Bend forward at the waist and bend your knees slightly to help protect your back. Relax your weak arm, let it hang limp. Relax your shoulder blade and let it drop.  Keep your shoulder relaxed and use body motion to swing your arm in small circles.  Stand up tall and relax.  Repeat motion and change direction of circles.  Start with swinging for about 30 seconds, and work towards swinging for 3 to 5 minutes. STRETCHING EXERCISES:  Lift your arm out in front of you with the elbow bent at 90 degrees. Using your other arm gently pull the elbow forward and across your body.  Bend one arm behind you with the palm facing outward. Using the other arm, hold a towel or rope and reach  this arm up above your head, then bend it at the elbow to move your wrist to behind your neck. Grab the free end of the towel with the hand behind your back. Gently pull the towel up with the hand behind your neck, gradually increasing the pull on the hand behind the small of your back. Then, gradually pull down with the hand behind the small of your back. This will pull the hand and arm behind your neck further. Both shoulders will have an increased range of motion with repetition of this exercise. STRENGTHENING EXERCISES:  Standing with your arm at your side and straight out from your shoulder with the elbow bent  at 90 degrees, hold onto a small weight and slowly raise your hand so it points straight up in the air. Repeat this five times to begin with, and gradually increase to ten times. Do this four times per day. As you grow stronger you can gradually increase the weight.  Repeat the above exercise, only this time using an elastic band. Start with your hand up in the air and pull down until your hand is by your side. As you grow stronger, gradually increase the amount you pull by increasing the number or size of the elastic bands. Use the same amount of repetitions.  Standing with your hand at your side and holding onto a weight, gradually lift the hand in front of you until it is over your head. Do the same also with the hand remaining at your side and lift the hand away from your body until it is again over your head. Repeat this five times to begin with, and gradually increase to ten times. Do this four times per day. As you grow stronger you can gradually increase the weight. Document Released: 06/07/2003 Document Revised: 09/13/2013 Document Reviewed: 09/08/2005 [facility] Patient Information 2015 [place], [place]. This information is not intended to replace advice given to you by your health care provider. Make sure you discuss any questions you have with your health care provider.

## 2014-06-12 NOTE — Assessment & Plan Note (Addendum)
Endorses symptoms of GERD after discontinuing PPI - Hx of PUD and reports blood in stool - Start Omeprazole 20 mg qd - Check CBC

## 2014-06-12 NOTE — Assessment & Plan Note (Signed)
Check LDL - Consider increasing Lipitor at next visit

## 2014-06-12 NOTE — Assessment & Plan Note (Signed)
Well-controlled continue current therapy - Check CMP

## 2014-06-12 NOTE — Assessment & Plan Note (Signed)
Pain consistent with Rotator cuff pathology; Some decreased external rotation - Educated her on shoulder stretches and exercises - f/u in 1 month - Tylenol for pain, due to inc Cr at last check

## 2014-06-12 NOTE — Assessment & Plan Note (Signed)
A1c much improved at goal of 8.0 - Discussed increasing Invokana; However GFR based on last Cr in 50 - Recheck Cr - Call and increase if Cr improved

## 2014-07-06 ENCOUNTER — Telehealth: Payer: Self-pay | Admitting: Family Medicine

## 2014-07-06 ENCOUNTER — Other Ambulatory Visit: Payer: Self-pay

## 2014-07-06 DIAGNOSIS — R234 Changes in skin texture: Secondary | ICD-10-CM

## 2014-07-06 NOTE — Telephone Encounter (Signed)
Pt called and needs a referral to the [facility] hospital radiology dept for a mammogram. jw

## 2014-07-11 ENCOUNTER — Ambulatory Visit (AMBULATORY_SURGERY_CENTER): Payer: Commercial Managed Care - HMO | Admitting: *Deleted

## 2014-07-11 VITALS — Ht 62.0 in | Wt 171.4 lb

## 2014-07-11 DIAGNOSIS — Z86010 Personal history of colonic polyps: Secondary | ICD-10-CM

## 2014-07-11 MED ORDER — MOVIPREP 100 G PO SOLR: 1.0000 | Freq: Once | ORAL | Status: DC

## 2014-07-11 NOTE — Progress Notes (Signed)
No egg or soy allergy. ewm No diet pills. ewm No issues with past sedation. ewm No blood thinners. ewm emmi video to daughter's e mail. ewm

## 2014-07-25 ENCOUNTER — Ambulatory Visit (AMBULATORY_SURGERY_CENTER): Payer: Medicare PPO | Admitting: Gastroenterology

## 2014-07-25 ENCOUNTER — Encounter: Payer: Self-pay | Admitting: Gastroenterology

## 2014-07-25 VITALS — BP 172/81 | HR 60 | Temp 97.0°F | Resp 16 | Ht 62.0 in | Wt 171.0 lb

## 2014-07-25 DIAGNOSIS — D128 Benign neoplasm of rectum: Secondary | ICD-10-CM

## 2014-07-25 DIAGNOSIS — K5730 Diverticulosis of large intestine without perforation or abscess without bleeding: Secondary | ICD-10-CM

## 2014-07-25 DIAGNOSIS — K621 Rectal polyp: Secondary | ICD-10-CM

## 2014-07-25 DIAGNOSIS — D123 Benign neoplasm of transverse colon: Secondary | ICD-10-CM

## 2014-07-25 DIAGNOSIS — Z86010 Personal history of colonic polyps: Secondary | ICD-10-CM

## 2014-07-25 DIAGNOSIS — D129 Benign neoplasm of anus and anal canal: Secondary | ICD-10-CM

## 2014-07-25 LAB — GLUCOSE, CAPILLARY
GLUCOSE-CAPILLARY: 93 mg/dL (ref 70–99)
GLUCOSE-CAPILLARY: 70 mg/dL (ref 70–99)
GLUCOSE-CAPILLARY: 80 mg/dL (ref 70–99)
Glucose-Capillary: 65 mg/dL — ABNORMAL LOW (ref 70–99)

## 2014-07-25 MED ORDER — SODIUM CHLORIDE 0.9 % IV SOLN: 500.0000 mL | INTRAVENOUS | Status: DC

## 2014-07-25 NOTE — Op Note (Signed)
[place]  [address] [place], [id number]   COLONOSCOPY PROCEDURE REPORT  PATIENT: Anna, [name]  MR#: [id number] BIRTHDATE: 1946-08-21 , 68  yrs. old GENDER: female ENDOSCOPIST: Milus Banister, MD PROCEDURE DATE:  07/25/2014 PROCEDURE:   Colonoscopy with biopsy and Colonoscopy with snare polypectomy First Screening Colonoscopy - Avg.  risk and is 50 yrs.  old or older - No.  Prior Negative Screening - Now for repeat screening. N/A  History of Adenoma - Now for follow-up colonoscopy & has been > or = to 3 yrs.  Yes hx of adenoma.  Has been 3 or more years since last colonoscopy.  Polyps Removed Today? Yes. ASA CLASS:   Class II INDICATIONS:sessile serrated adenoma removed 2009. MEDICATIONS: Monitored anesthesia care and Propofol 240 mg IV  DESCRIPTION OF PROCEDURE:   After the risks benefits and alternatives of the procedure were thoroughly explained, informed consent was obtained.  The digital rectal exam revealed no abnormalities of the rectum.   The [facility] [id number]  endoscope was introduced through the anus and advanced to the cecum, which was identified by both the appendix and ileocecal valve. No adverse events experienced.   The quality of the prep was excellent.  The instrument was then slowly withdrawn as the colon was fully examined.  COLON FINDINGS: A sessile polyp measuring 2 mm in size was found in the transverse colon.  A biopsy was performed using cold forceps. Sample was obtained and sent to histology (jar 1)  Two sessile polyps ranging between 3-60mm in size were found in the rectum. Polypectomies were performed with a cold snare.  The resection was complete, the polyp tissue was completely retrieved and sent to histology (jar 2).   There was mild diverticulosis noted in the left colon.   The examination was otherwise normal.  Retroflexed views revealed no abnormalities. The time to cecum=3 minutes 54 seconds.  Withdrawal time=8  minutes 57 seconds.  The scope was withdrawn and the procedure completed. COMPLICATIONS: There were no immediate complications. ENDOSCOPIC IMPRESSION: 1.   Sessile polyp was found in the transverse colon; biopsy was performed using cold forceps 2.   Two sessile polyps ranging between 3-26mm in size were found in the rectum; polypectomies were performed with a cold snare 3.   Mild diverticulosis was noted in the left colon 4.   The examination was otherwise normal RECOMMENDATIONS: If the polyp(s) removed today are proven to be adenomatous (pre-cancerous) polyps, you will need a colonoscopy in 3 years. Otherwise you should continue to follow colorectal cancer screening guidelines for "routine risk" patients with a colonoscopy in 10 years.  You will receive a letter within 1-2 weeks with the results of your biopsy as well as final recommendations.  Please call my office if you have not received a letter after 3 weeks.  eSigned:  Milus Banister, MD 07/25/2014 9:38 AM   cc: Phill Myron, MD   PATIENT NAME:  Anna, [name] MR#: [id number]

## 2014-07-25 NOTE — Patient Instructions (Addendum)
YOU HAD AN ENDOSCOPIC PROCEDURE TODAY AT THE  ENDOSCOPY CENTER: Refer to the procedure report that was given to you for any specific questions about what was found during the examination.  If the procedure report does not answer your questions, please call your gastroenterologist to clarify.  If you requested that your care partner not be given the details of your procedure findings, then the procedure report has been included in a sealed envelope for you to review at your convenience later.  YOU SHOULD EXPECT: Some feelings of bloating in the abdomen. Passage of more gas than usual.  Walking can help get rid of the air that was put into your GI tract during the procedure and reduce the bloating. If you had a lower endoscopy (such as a colonoscopy or flexible sigmoidoscopy) you may notice spotting of blood in your stool or on the toilet paper. If you underwent a bowel prep for your procedure, then you may not have a normal bowel movement for a few days.  DIET: Your first meal following the procedure should be a light meal and then it is ok to progress to your normal diet.  A half-sandwich or bowl of soup is an example of a good first meal.  Heavy or fried foods are harder to digest and may make you feel nauseous or bloated.  Likewise meals heavy in dairy and vegetables can cause extra gas to form and this can also increase the bloating.  Drink plenty of fluids but you should avoid alcoholic beverages for 24 hours.  ACTIVITY: Your care partner should take you home directly after the procedure.  You should plan to take it easy, moving slowly for the rest of the day.  You can resume normal activity the day after the procedure however you should NOT DRIVE or use heavy machinery for 24 hours (because of the sedation medicines used during the test).    SYMPTOMS TO REPORT IMMEDIATELY: A gastroenterologist can be reached at any hour.  During normal business hours, 8:30 AM to 5:00 PM Monday through Friday,  call [phone].  After hours and on weekends, please call the GI answering service at [phone] who will take a message and have the physician on call contact you.   Following lower endoscopy (colonoscopy or flexible sigmoidoscopy):  Excessive amounts of blood in the stool  Significant tenderness or worsening of abdominal pains  Swelling of the abdomen that is new, acute  Fever of 100F or higher  FOLLOW UP: If any biopsies were taken you will be contacted by phone or by letter within the next 1-3 weeks.  Call your gastroenterologist if you have not heard about the biopsies in 3 weeks.  Our staff will call the home number listed on your records the next business day following your procedure to check on you and address any questions or concerns that you may have at that time regarding the information given to you following your procedure. This is a courtesy call and so if there is no answer at the home number and we have not heard from you through the emergency physician on call, we will assume that you have returned to your regular daily activities without incident.  SIGNATURES/CONFIDENTIALITY: You and/or your care partner have signed paperwork which will be entered into your electronic medical record.  These signatures attest to the fact that that the information above on your After Visit Summary has been reviewed and is understood.  Full responsibility of the confidentiality of this   discharge information lies with you and/or your care-partner.YOU HAD AN ENDOSCOPIC PROCEDURE TODAY AT [facility]: Refer to the procedure report that was given to you for any specific questions about what was found during the examination.  If the procedure report does not answer your questions, please call your gastroenterologist to clarify.  If you requested that your care partner not be given the details of your procedure findings, then the procedure report has been included in a sealed  envelope for you to review at your convenience later.  YOU SHOULD EXPECT: Some feelings of bloating in the abdomen. Passage of more gas than usual.  Walking can help get rid of the air that was put into your GI tract during the procedure and reduce the bloating. If you had a lower endoscopy (such as a colonoscopy or flexible sigmoidoscopy) you may notice spotting of blood in your stool or on the toilet paper. If you underwent a bowel prep for your procedure, then you may not have a normal bowel movement for a few days.  DIET: Your first meal following the procedure should be a light meal and then it is ok to progress to your normal diet.  A half-sandwich or bowl of soup is an example of a good first meal.  Heavy or fried foods are harder to digest and may make you feel nauseous or bloated.  Likewise meals heavy in dairy and vegetables can cause extra gas to form and this can also increase the bloating.  Drink plenty of fluids but you should avoid alcoholic beverages for 24 hours.  ACTIVITY: Your care partner should take you home directly after the procedure.  You should plan to take it easy, moving slowly for the rest of the day.  You can resume normal activity the day after the procedure however you should NOT DRIVE or use heavy machinery for 24 hours (because of the sedation medicines used during the test).    SYMPTOMS TO REPORT IMMEDIATELY: A gastroenterologist can be reached at any hour.  During normal business hours, 8:30 AM to 5:00 PM Monday through Friday, call [phone].  After hours and on weekends, please call the GI answering service at [phone] who will take a message and have the physician on call contact you.   Following lower endoscopy (colonoscopy or flexible sigmoidoscopy):  Excessive amounts of blood in the stool  Significant tenderness or worsening of abdominal pains  Swelling of the abdomen that is new, acute  Fever of 100F or higher  FOLLOW UP: If any biopsies were  taken you will be contacted by phone or by letter within the next 1-3 weeks.  Call your gastroenterologist if you have not heard about the biopsies in 3 weeks.  Our staff will call the home number listed on your records the next business day following your procedure to check on you and address any questions or concerns that you may have at that time regarding the information given to you following your procedure. This is a courtesy call and so if there is no answer at the home number and we have not heard from you through the emergency physician on call, we will assume that you have returned to your regular daily activities without incident.  SIGNATURES/CONFIDENTIALITY: You and/or your care partner have signed paperwork which will be entered into your electronic medical record.  These signatures attest to the fact that that the information above on your After Visit Summary has been reviewed and is understood.  Full responsibility of the confidentiality of this discharge information lies with you and/or your care-partner.

## 2014-07-25 NOTE — Progress Notes (Signed)
Called to room to assist during endoscopic procedure.  Patient ID and intended procedure confirmed with present staff. Received instructions for my participation in the procedure from the performing physician.  

## 2014-07-25 NOTE — Progress Notes (Signed)
Report to PACU, RN, vss, BBS= Clear.  

## 2014-07-26 ENCOUNTER — Telehealth: Payer: Self-pay | Admitting: *Deleted

## 2014-07-26 NOTE — Telephone Encounter (Signed)
  Follow up Call-  Call back number 07/25/2014  Post procedure Call Back phone  # [phone]  Permission to leave phone message Yes     Patient questions:  Do you have a fever, pain , or abdominal swelling? No. Pain Score  0 *  Have you tolerated food without any problems? Yes.    Have you been able to return to your normal activities? Yes.    Do you have any questions about your discharge instructions: Diet   No. Medications  No. Follow up visit  No.  Do you have questions or concerns about your Care? No.  Actions: * If pain score is 4 or above: No action needed, pain <4.

## 2014-07-28 ENCOUNTER — Telehealth: Payer: Self-pay | Admitting: *Deleted

## 2014-07-28 NOTE — Telephone Encounter (Signed)
Pt stated blood glucose this morning was 43, she was feeling very nervous.  Pt has not had anything to eat this AM, she was just eating peanut butter. Pt advised to eat breakfast and recheck blood glucose in 30 minutes and return call back to nurse with reading.  Derl Barrow, RN

## 2014-07-28 NOTE — Telephone Encounter (Signed)
Returned call to pt regarding low blood sugar.  Pt stated her blood sugar went back up to 124.  Pt advised to schedule an appt with PCP if she continues to have low blood glucose levels.  Pt is feeling fine now.  Pt stated understanding.  Derl Barrow, RN

## 2014-08-02 ENCOUNTER — Encounter: Payer: Self-pay | Admitting: Gastroenterology

## 2014-08-10 ENCOUNTER — Encounter: Payer: Self-pay | Admitting: Family Medicine

## 2014-08-10 ENCOUNTER — Ambulatory Visit (INDEPENDENT_AMBULATORY_CARE_PROVIDER_SITE_OTHER): Payer: Medicare PPO | Admitting: Family Medicine

## 2014-08-10 VITALS — BP 113/76 | HR 84 | Temp 98.4°F | Wt 169.0 lb

## 2014-08-10 DIAGNOSIS — F17200 Nicotine dependence, unspecified, uncomplicated: Secondary | ICD-10-CM

## 2014-08-10 DIAGNOSIS — N183 Chronic kidney disease, stage 3 unspecified: Secondary | ICD-10-CM

## 2014-08-10 DIAGNOSIS — F329 Major depressive disorder, single episode, unspecified: Secondary | ICD-10-CM

## 2014-08-10 DIAGNOSIS — F32A Depression, unspecified: Secondary | ICD-10-CM

## 2014-08-10 DIAGNOSIS — E119 Type 2 diabetes mellitus without complications: Secondary | ICD-10-CM

## 2014-08-10 DIAGNOSIS — E785 Hyperlipidemia, unspecified: Secondary | ICD-10-CM

## 2014-08-10 DIAGNOSIS — Z720 Tobacco use: Secondary | ICD-10-CM

## 2014-08-10 DIAGNOSIS — I10 Essential (primary) hypertension: Secondary | ICD-10-CM

## 2014-08-10 DIAGNOSIS — M25511 Pain in right shoulder: Secondary | ICD-10-CM

## 2014-08-10 HISTORY — DX: Chronic kidney disease, stage 3 unspecified: N18.30

## 2014-08-10 MED ORDER — TRAMADOL HCL 50 MG PO TABS: 50.0000 mg | ORAL_TABLET | Freq: Three times a day (TID) | ORAL | Status: DC | PRN

## 2014-08-10 NOTE — Assessment & Plan Note (Signed)
Reports quitting a few months ago

## 2014-08-10 NOTE — Assessment & Plan Note (Signed)
Well controlled today. Continue current meds. Discussed stopping Metoprolol if BP continues to improve with exercise and weight loss - Discussed limiting NSAIDs due to CKD stage 3

## 2014-08-10 NOTE — Assessment & Plan Note (Signed)
Consider trial off Statin if right shoulder pain persists w/o improvement

## 2014-08-10 NOTE — Assessment & Plan Note (Signed)
A1c well controlled at last visit - Will recheck A1c at next visit

## 2014-08-10 NOTE — Assessment & Plan Note (Signed)
Continued to have right shoulder pain with positive empty can and painful arch; ROM improved since last visit with exercises - Doubt frozen shoulder at this time - Discussed shoulder injection. She will continue shoulder exercises and consider shoulder inj if not improving in ~ 2-4 weeks - Ultram # 60 (50mg ) qhs given for night time pain - Advised limiting aleve ; NSAIDs due to CKD

## 2014-08-10 NOTE — Assessment & Plan Note (Signed)
Using Zoloft "when needed" - Two previous depressive episodes w/o SI or hospitalization - Discussed stopping this today, but she wanted to continue - Consider trial off at next visit and PHQ-9 for baseline

## 2014-08-10 NOTE — Progress Notes (Signed)
  Patient name: Anna [name] MRN [id number]  Date of birth: 1946-06-20  CC & HPI:  Anna [name] is a 68 y.o. female presenting today for right shoulder pain; and medication reconciliation concerning DM, HTN, HLD, Depression.   Right Shoulder pain - Continues to report achy lateral shoulder pain that radiates down her arm - denies numbness, tingling or weakness - Using Aleve every other day - Continues to do some of the shoulder exercises - denies decreased ROM - denies neck pain  Depression - She reports 2 previous depressive episodes with crying spells w/o SI or hospitalization that responded to medication - She currently uses SSRI "as needed"  DM; HTN; HLD; Tobacco - Review medications and potential side-effects which she denies - She reports not smoking for several months now - Has been exercising and losing weight - Reports blood sugars under good control w/o hypoglycemia  ROS: Denies night sweats; unintentional weight loss Medications & Allergies: Reviewed  Social History: Reviewed:   Objective Findings:  Vitals: BP 113/76 mmHg  Pulse 84  Temp(Src) 98.4 F (36.9 C) (Oral)  Wt 169 lb (76.658 kg)  Gen: NAD CV: RRR w/o m/r/g, pulses +2 b/l Resp: CTAB w/ normal respiratory effort R Shoulder: Inspection reveals no abnormalities, atrophy or asymmetry. Palpation: tenderness over lateral shoulder; no tenderness over AC joint or bicipital groove. ROM is full in all planes. Rotator cuff strength normal throughout. Positive Hawkin's tests & empty can sign. Painful arc sign.   Assessment & Plan:   Please See Problem Focused Assessment & Plan

## 2014-08-10 NOTE — Patient Instructions (Signed)
It was great seeing you today.   1. Take Ultram 50mg  (1 pill) at night for your shoulder pain.  2. Ice your shoulder for 20 minutes before bed and after exercise 3. Keep up your good work with exercising and weight loss - You might be able to come off some blood sugar and blood pressure medications if you continuing doing well!!!! 4. CONGRATS  ON QUITTING SMOKING   Please bring all your medications to every doctors visit  Sign up for My Chart to have easy access to your labs results, and communication with your Primary care physician.  Next Appointment  Please make an appointment with Dr Berkley Harvey in January    I look forward to talking with you again at our next visit. If you have any questions or concerns before then, please call the clinic at [phone].  Take Care,   Dr Phill Myron

## 2014-08-24 ENCOUNTER — Telehealth: Payer: Self-pay | Admitting: Family Medicine

## 2014-08-24 NOTE — Telephone Encounter (Signed)
Pt states that she called the pharmacy.  They say that everything has expired except the invokana.  Pt states she "really needs these called in today". Cora Stetson, Salome Spotted

## 2014-08-24 NOTE — Telephone Encounter (Signed)
Pt checking status of refills, says she called them into the pharmacy 2 days ago, pt needs all meds refilled, goes to [facility]/[address]

## 2014-08-25 ENCOUNTER — Telehealth: Payer: Self-pay | Admitting: Family Medicine

## 2014-08-25 NOTE — Telephone Encounter (Signed)
Pt called because starting now all her medication will be through mail order. She said that [facility] will be sending [name] fax request from now on. jw

## 2014-08-29 ENCOUNTER — Other Ambulatory Visit: Payer: Self-pay | Admitting: *Deleted

## 2014-08-29 DIAGNOSIS — M25511 Pain in right shoulder: Secondary | ICD-10-CM

## 2014-08-29 DIAGNOSIS — F329 Major depressive disorder, single episode, unspecified: Secondary | ICD-10-CM

## 2014-08-29 DIAGNOSIS — F32A Depression, unspecified: Secondary | ICD-10-CM

## 2014-08-29 DIAGNOSIS — I10 Essential (primary) hypertension: Secondary | ICD-10-CM

## 2014-08-29 MED ORDER — ATORVASTATIN CALCIUM 20 MG PO TABS: 20.0000 mg | ORAL_TABLET | Freq: Every day | ORAL | Status: DC

## 2014-08-29 MED ORDER — QUINAPRIL-HYDROCHLOROTHIAZIDE 20-25 MG PO TABS: 1.0000 | ORAL_TABLET | Freq: Every day | ORAL | Status: DC

## 2014-08-29 MED ORDER — INSULIN GLARGINE 100 UNIT/ML ~~LOC~~ SOLN
60.0000 [IU] | Freq: Every day | SUBCUTANEOUS | Status: DC
Start: 2014-08-29 — End: 2014-11-27

## 2014-08-29 MED ORDER — AMLODIPINE BESYLATE 5 MG PO TABS: 5.0000 mg | ORAL_TABLET | Freq: Every day | ORAL | Status: DC

## 2014-08-29 MED ORDER — SERTRALINE HCL 50 MG PO TABS: 50.0000 mg | ORAL_TABLET | Freq: Every day | ORAL | Status: DC

## 2014-08-29 MED ORDER — METOPROLOL SUCCINATE ER 25 MG PO TB24: 25.0000 mg | ORAL_TABLET | Freq: Every day | ORAL | Status: DC

## 2014-08-29 MED ORDER — CANAGLIFLOZIN 100 MG PO TABS: ORAL_TABLET | ORAL | Status: DC

## 2014-08-29 NOTE — Telephone Encounter (Signed)
Need a Rx for BD insulin Pen needle UF Short 31 gauge x 5/16" sent in to [facility].  Derl Barrow, RN

## 2014-09-05 ENCOUNTER — Telehealth: Payer: Self-pay | Admitting: Family Medicine

## 2014-09-05 ENCOUNTER — Other Ambulatory Visit: Payer: Self-pay | Admitting: Family Medicine

## 2014-09-05 MED ORDER — SYRINGE (DISPOSABLE) 1 ML MISC: 1.0000 | Freq: Every day | Status: DC

## 2014-09-05 NOTE — Telephone Encounter (Signed)
Will forward message to MD to write.  Taryn Nave,CMA

## 2014-09-05 NOTE — Telephone Encounter (Signed)
Insurance company [employer] needs Dr Berkley Harvey to contact them They need a RX for her needles Fax;  [phone]

## 2014-10-21 ENCOUNTER — Encounter (HOSPITAL_COMMUNITY): Payer: Self-pay | Admitting: Emergency Medicine

## 2014-10-21 ENCOUNTER — Emergency Department (HOSPITAL_COMMUNITY)
Admission: EM | Admit: 2014-10-21 | Discharge: 2014-10-21 | Disposition: A | Discharge status: Home / Self Care | Payer: Commercial Managed Care - HMO | Attending: Emergency Medicine | Admitting: Emergency Medicine

## 2014-10-21 ENCOUNTER — Emergency Department (HOSPITAL_COMMUNITY): Payer: Commercial Managed Care - HMO

## 2014-10-21 DIAGNOSIS — E119 Type 2 diabetes mellitus without complications: Secondary | ICD-10-CM | POA: Insufficient documentation

## 2014-10-21 DIAGNOSIS — R55 Syncope and collapse: Secondary | ICD-10-CM | POA: Diagnosis not present

## 2014-10-21 DIAGNOSIS — Z720 Tobacco use: Secondary | ICD-10-CM | POA: Diagnosis not present

## 2014-10-21 DIAGNOSIS — H269 Unspecified cataract: Secondary | ICD-10-CM | POA: Diagnosis not present

## 2014-10-21 DIAGNOSIS — I129 Hypertensive chronic kidney disease with stage 1 through stage 4 chronic kidney disease, or unspecified chronic kidney disease: Secondary | ICD-10-CM | POA: Insufficient documentation

## 2014-10-21 DIAGNOSIS — Z7982 Long term (current) use of aspirin: Secondary | ICD-10-CM | POA: Diagnosis not present

## 2014-10-21 DIAGNOSIS — N183 Chronic kidney disease, stage 3 (moderate): Secondary | ICD-10-CM | POA: Diagnosis not present

## 2014-10-21 DIAGNOSIS — Z8719 Personal history of other diseases of the digestive system: Secondary | ICD-10-CM | POA: Insufficient documentation

## 2014-10-21 DIAGNOSIS — M1990 Unspecified osteoarthritis, unspecified site: Secondary | ICD-10-CM | POA: Diagnosis not present

## 2014-10-21 DIAGNOSIS — Z794 Long term (current) use of insulin: Secondary | ICD-10-CM | POA: Diagnosis not present

## 2014-10-21 DIAGNOSIS — H409 Unspecified glaucoma: Secondary | ICD-10-CM | POA: Insufficient documentation

## 2014-10-21 DIAGNOSIS — E785 Hyperlipidemia, unspecified: Secondary | ICD-10-CM | POA: Diagnosis not present

## 2014-10-21 DIAGNOSIS — Z79899 Other long term (current) drug therapy: Secondary | ICD-10-CM | POA: Diagnosis not present

## 2014-10-21 LAB — COMPREHENSIVE METABOLIC PANEL
ALBUMIN: 3.9 g/dL (ref 3.5–5.2)
ALT: 16 U/L (ref 0–35)
AST: 21 U/L (ref 0–37)
Alkaline Phosphatase: 72 U/L (ref 39–117)
Anion gap: 9 (ref 5–15)
BILIRUBIN TOTAL: 0.6 mg/dL (ref 0.3–1.2)
BUN: 22 mg/dL (ref 6–23)
CALCIUM: 9.4 mg/dL (ref 8.4–10.5)
CO2: 29 mmol/L (ref 19–32)
Chloride: 104 mmol/L (ref 96–112)
Creatinine, Ser: 1.35 mg/dL — ABNORMAL HIGH (ref 0.50–1.10)
GFR calc Af Amer: 46 mL/min — ABNORMAL LOW (ref >90)
GFR calc non Af Amer: 40 mL/min — ABNORMAL LOW (ref >90)
Glucose, Bld: 180 mg/dL — ABNORMAL HIGH (ref 70–99)
Potassium: 3.6 mmol/L (ref 3.5–5.1)
SODIUM: 142 mmol/L (ref 135–145)
TOTAL PROTEIN: 6.7 g/dL (ref 6.0–8.3)

## 2014-10-21 LAB — URINALYSIS, ROUTINE W REFLEX MICROSCOPIC
Bilirubin Urine: NEGATIVE
HGB URINE DIPSTICK: NEGATIVE
Ketones, ur: NEGATIVE mg/dL
Leukocytes, UA: NEGATIVE
Nitrite: NEGATIVE
Protein, ur: NEGATIVE mg/dL
Specific Gravity, Urine: 1.024 (ref 1.005–1.030)
UROBILINOGEN UA: 0.2 mg/dL (ref 0.0–1.0)
pH: 5.5 (ref 5.0–8.0)

## 2014-10-21 LAB — URINE MICROSCOPIC-ADD ON

## 2014-10-21 LAB — CBC
HCT: 35.7 % — ABNORMAL LOW (ref 36.0–46.0)
Hemoglobin: 11.5 g/dL — ABNORMAL LOW (ref 12.0–15.0)
MCH: 27.2 pg (ref 26.0–34.0)
MCHC: 32.2 g/dL (ref 30.0–36.0)
MCV: 84.4 fL (ref 78.0–100.0)
Platelets: 220 10*3/uL (ref 150–400)
RBC: 4.23 MIL/uL (ref 3.87–5.11)
RDW: 14 % (ref 11.5–15.5)
WBC: 8.5 10*3/uL (ref 4.0–10.5)

## 2014-10-21 LAB — CBG MONITORING, ED: Glucose-Capillary: 150 mg/dL — ABNORMAL HIGH (ref 70–99)

## 2014-10-21 LAB — TROPONIN I

## 2014-10-21 NOTE — ED Notes (Addendum)
Per EMS, pt had an episode of dizziness and light headedness today at 1325. No LOC or fall. Pt was slightly lethargic upon EMS arrival pt improved as they got her in the truck. EKG showed NSR. NAD at this time. Pt alert x4. Pts only complaint in route was sore throat and dry mouth. Pt received 26ml of NS in route.

## 2014-10-21 NOTE — ED Notes (Signed)
Pts family reports that they think the pts speech sounds like her tongue is swollen. RN went back into pts room to reassess pt. MD at bedside, MD made aware of what the family reported. MD assessed pt no code stroke called at this time.

## 2014-10-21 NOTE — ED Provider Notes (Signed)
CSN: [id number]     Arrival date & time 10/21/14  1404 History   First MD Initiated Contact with Patient 10/21/14 1426     Chief Complaint  Patient presents with  . Near Syncope    Patient is a 69 y.o. female presenting with near-syncope. The history is provided by the patient and a relative. No language interpreter was used.  Near Syncope   Ms. Pettry presents for evaluation of near-syncope. She was in her routine state of health when she developed dizziness and lightheadedness and generalized weakness. She was in the process of helping her daughter move when this happened. Daughter reports that the patient had slurred speech and appeared to be somewhat confused and drowsy. The patient denies any fevers, headache, chest pain, shortness of breath, abdominal pain, vomiting, diarrhea, dysuria. Patient has no complaints currently. She did miss her insulin dose this morning because it is packed up currently. Patient endorses fatigue but no other complaints.  Symptoms are moderate, constant, improving. Symptoms started at 1:25 PM.  Past Medical History  Diagnosis Date  . Diabetes mellitus   . Hypertension   . Cataract   . Glaucoma   . Arthritis   . Blood transfusion without reported diagnosis     40 plus yrs ago  . GERD (gastroesophageal reflux disease)   . Hyperlipidemia   . CKD (chronic kidney disease), stage III 08/10/2014   Past Surgical History  Procedure Laterality Date  . Abdominal hysterectomy    . Colonoscopy    . Polypectomy     Family History  Problem Relation Age of Onset  . Hyperlipidemia Mother   . Diabetes Mother   . Cancer Brother   . Hyperlipidemia Brother   . Diabetes Daughter   . Colon cancer Neg Hx   . Colon polyps Neg Hx   . Rectal cancer Neg Hx   . Stomach cancer Neg Hx   . Esophageal cancer Neg Hx    History  Substance Use Topics  . Smoking status: Current Some Day Smoker -- 30 years    Types: Cigarettes  . Smokeless tobacco: Never Used      Comment: 1 cig a week  . Alcohol Use: 0.6 oz/week    1 Cans of beer per week     Comment: occasional beer   OB History    No data available     Review of Systems  Cardiovascular: Positive for near-syncope.  All other systems reviewed and are negative.     Allergies  Metformin  Home Medications   Prior to Admission medications   Medication Sig Start Date End Date Taking? Authorizing Provider  amLODipine (NORVASC) 5 MG tablet Take 1 tablet (5 mg total) by mouth daily. 08/29/14 09/29/15  Olam Idler, MD  aspirin 81 MG tablet Take 81 mg by mouth daily.      Historical Provider, MD  atorvastatin (LIPITOR) 20 MG tablet Take 1 tablet (20 mg total) by mouth daily. 08/29/14   Olam Idler, MD  canagliflozin [facility]) 100 MG TABS tablet TAKE ONE TABLET BY MOUTH ONCE DAILY BEFORE  BREAKFAST 08/29/14   Olam Idler, MD  ibuprofen (ADVIL,MOTRIN) 200 MG tablet Take 400 mg by mouth every 6 (six) hours as needed for mild pain.    Historical Provider, MD  insulin glargine (LANTUS) 100 UNIT/ML injection Inject 0.6 mLs (60 Units total) into the skin daily. 08/29/14   Olam Idler, MD  LUMIGAN 0.01 % SOLN Place 1 drop into both eyes  at bedtime.  05/28/14   Historical Provider, MD  metoprolol succinate (TOPROL XL) 25 MG 24 hr tablet Take 1 tablet (25 mg total) by mouth daily. 08/29/14 09/15/15  Olam Idler, MD  quinapril-hydrochlorothiazide (ACCURETIC) 20-25 MG per tablet Take 1 tablet by mouth daily. 08/29/14   Olam Idler, MD  sertraline (ZOLOFT) 50 MG tablet Take 1 tablet (50 mg total) by mouth daily. 08/29/14   Olam Idler, MD  Syringe, Disposable, 1 ML MISC 1 each by Does not apply route daily. 09/05/14   Olam Idler, MD  traMADol (ULTRAM) 50 MG tablet Take 1 tablet (50 mg total) by mouth every 8 (eight) hours as needed. 08/10/14   Olam Idler, MD   BP 109/65 mmHg  Temp(Src) 97.4 F (36.3 C) (Oral)  Resp 16  SpO2 97% Physical Exam  Constitutional: She is oriented to person,  place, and time. She appears well-developed and well-nourished.  HENT:  Head: Normocephalic and atraumatic.  Eyes: EOM are normal. Pupils are equal, round, and reactive to light.  Cardiovascular: Normal rate and regular rhythm.   No murmur heard. Pulmonary/Chest: Effort normal and breath sounds normal. No respiratory distress.  Abdominal: Soft. There is no tenderness. There is no rebound and no guarding.  Musculoskeletal: She exhibits no edema or tenderness.  Neurological: She is alert and oriented to person, place, and time. No cranial nerve deficit. She exhibits normal muscle tone. Coordination normal.  Skin: Skin is warm and dry.  Psychiatric: She has a normal mood and affect. Her behavior is normal.  Nursing note and vitals reviewed.   ED Course  Procedures (including critical care time) Labs Review Labs Reviewed  CBC - Abnormal; Notable for the following:    Hemoglobin 11.5 (*)    HCT 35.7 (*)    All other components within normal limits  COMPREHENSIVE METABOLIC PANEL - Abnormal; Notable for the following:    Glucose, Bld 180 (*)    Creatinine, Ser 1.35 (*)    GFR calc non Af Amer 40 (*)    GFR calc Af Amer 46 (*)    All other components within normal limits  URINALYSIS, ROUTINE W REFLEX MICROSCOPIC - Abnormal; Notable for the following:    Glucose, UA >1000 (*)    All other components within normal limits  URINE MICROSCOPIC-ADD ON - Abnormal; Notable for the following:    Squamous Epithelial / LPF FEW (*)    All other components within normal limits  CBG MONITORING, ED - Abnormal; Notable for the following:    Glucose-Capillary 150 (*)    All other components within normal limits  TROPONIN I    Imaging Review Dg Chest 2 View  10/21/2014   CLINICAL DATA:  Near syncope. Pt. States she started breathing funny 2 hours ago. Diabetic, HTN. Smokes 1 cigarette every 2 weeks  EXAM: CHEST  2 VIEW  COMPARISON:  02/04/2014  FINDINGS: Cardiac silhouette is top-normal in size.  Aorta is uncoiled. No mediastinal or hilar masses or evidence of adenopathy.  Few mild areas of reticular opacity in the lower lungs are stable consistent with scarring. There is mild stable scarring in the right upper lobe. No lung consolidation or edema. No pleural effusion or pneumothorax.  Bony thorax is demineralized but intact.  IMPRESSION: No acute cardiopulmonary disease.   Electronically Signed   By: Lajean Manes M.D.   On: 10/21/2014 15:56   Ct Head Wo Contrast  10/21/2014   CLINICAL DATA:  Acute onset of  confusion and slurred speech  EXAM: CT HEAD WITHOUT CONTRAST  TECHNIQUE: Contiguous axial images were obtained from the base of the skull through the vertex without intravenous contrast.  COMPARISON:  None.  FINDINGS: The ventricles are normal in size and configuration. There is no appreciable mass, hemorrhage, extra-axial fluid collection, or midline shift. Gray-white compartments are normal. No acute infarct apparent. The bony calvarium appears intact. The mastoid air cells are clear.  IMPRESSION: No intracranial mass, hemorrhage, or focal gray -white compartment lesions/acute appearing infarct.   Electronically Signed   By: Lowella Grip M.D.   On: 10/21/2014 15:45     EKG Interpretation   Date/Time:  Saturday October 21 2014 14:10:42 EST Ventricular Rate:  70 PR Interval:  166 QRS Duration: 101 QT Interval:  455 QTC Calculation: 491 R Axis:   59 Text Interpretation:  Sinus rhythm Nonspecific T abnrm, anterolateral  leads Borderline prolonged QT interval Confirmed by Hazle Coca [phone]) on  10/21/2014 2:24:51 PM      MDM   Final diagnoses:  Near syncope     Pt here for evaluation of near syncopal episode. Clinical picture is not consistent with TIA/CVA. Patient with nonfocal neuro exam with normal speech. She does report feeling fatigued. Discussed with patient and family findings of studies as well as close return precautions.    Quintella Reichert, MD 10/21/14 [phone]

## 2014-10-21 NOTE — ED Notes (Signed)
Pt ambulated in hall without any dizziness or pain. Pt state, "I am just tired."

## 2014-10-21 NOTE — Discharge Instructions (Signed)
The cause of your fatigue and near-syncope was not identified today.  Please get rechecked immediately if you develop any new or concerning symptoms.     Near-Syncope Near-syncope (commonly known as near fainting) is sudden weakness, dizziness, or feeling like you might pass out. During an episode of near-syncope, you may also develop pale skin, have tunnel vision, or feel sick to your stomach (nauseous). Near-syncope may occur when getting up after sitting or while standing for a long time. It is caused by a sudden decrease in blood flow to the brain. This decrease can result from various causes or triggers, most of which are not serious. However, because near-syncope can sometimes be a sign of something serious, a medical evaluation is required. The specific cause is often not determined. HOME CARE INSTRUCTIONS  Monitor your condition for any changes. The following actions may help to alleviate any discomfort you are experiencing:  Have someone stay with you until you feel stable.  Lie down right away and prop your feet up if you start feeling like you might faint. Breathe deeply and steadily. Wait until all the symptoms have passed. Most of these episodes last only a few minutes. You may feel tired for several hours.   Drink enough fluids to keep your urine clear or pale yellow.   If you are taking blood pressure or heart medicine, get up slowly when seated or lying down. Take several minutes to sit and then stand. This can reduce dizziness.  Follow up with your health care provider as directed. SEEK IMMEDIATE MEDICAL CARE IF:   You have a severe headache.   You have unusual pain in the chest, abdomen, or back.   You are bleeding from the mouth or rectum, or you have black or tarry stool.   You have an irregular or very fast heartbeat.   You have repeated fainting or have seizure-like jerking during an episode.   You faint when sitting or lying down.   You have confusion.    You have difficulty walking.   You have severe weakness.   You have vision problems.  MAKE SURE YOU:   Understand these instructions.  Will watch your condition.  Will get help right away if you are not doing well or get worse. Document Released: 09/08/2005 Document Revised: 09/13/2013 Document Reviewed: 02/11/2013 [facility] Patient Information 2015 [place], [place]. This information is not intended to replace advice given to you by your health care provider. Make sure you discuss any questions you have with your health care provider.

## 2014-10-23 ENCOUNTER — Encounter: Payer: Self-pay | Admitting: Family Medicine

## 2014-10-23 ENCOUNTER — Ambulatory Visit (INDEPENDENT_AMBULATORY_CARE_PROVIDER_SITE_OTHER): Payer: Medicare PPO | Admitting: Family Medicine

## 2014-10-23 VITALS — BP 107/71 | HR 67 | Ht 62.0 in | Wt 161.0 lb

## 2014-10-23 DIAGNOSIS — M799 Soft tissue disorder, unspecified: Secondary | ICD-10-CM

## 2014-10-23 DIAGNOSIS — M7592 Shoulder lesion, unspecified, left shoulder: Secondary | ICD-10-CM

## 2014-10-23 DIAGNOSIS — G459 Transient cerebral ischemic attack, unspecified: Secondary | ICD-10-CM

## 2014-10-23 DIAGNOSIS — R4781 Slurred speech: Secondary | ICD-10-CM

## 2014-10-23 DIAGNOSIS — M7989 Other specified soft tissue disorders: Secondary | ICD-10-CM

## 2014-10-23 LAB — LIPID PANEL
CHOL/HDL RATIO: 2.3 ratio
CHOLESTEROL: 125 mg/dL (ref 0–200)
HDL: 54 mg/dL (ref >39)
LDL Cholesterol: 42 mg/dL (ref 0–99)
Triglycerides: 145 mg/dL (ref <150)
VLDL: 29 mg/dL (ref 0–40)

## 2014-10-23 LAB — POCT GLYCOSYLATED HEMOGLOBIN (HGB A1C): Hemoglobin A1C: 7.7

## 2014-10-23 NOTE — Patient Instructions (Signed)
Nice to see you. We will call with the appointment for your MRI and ultrasound.  If you develop recurrent symptoms, Slurred speech, weakness, numbness, facial droop, or change in voice please seek medical attention.

## 2014-10-24 ENCOUNTER — Ambulatory Visit
Admission: RE | Admit: 2014-10-24 | Discharge: 2014-10-24 | Disposition: A | Discharge status: Home / Self Care | Payer: Commercial Managed Care - HMO | Source: Ambulatory Visit | Attending: Family Medicine | Admitting: Family Medicine

## 2014-10-24 DIAGNOSIS — M7989 Other specified soft tissue disorders: Secondary | ICD-10-CM

## 2014-10-25 ENCOUNTER — Encounter: Payer: Self-pay | Admitting: Family Medicine

## 2014-10-25 ENCOUNTER — Telehealth: Payer: Self-pay | Admitting: Family Medicine

## 2014-10-25 DIAGNOSIS — M7592 Shoulder lesion, unspecified, left shoulder: Secondary | ICD-10-CM | POA: Insufficient documentation

## 2014-10-25 DIAGNOSIS — G459 Transient cerebral ischemic attack, unspecified: Secondary | ICD-10-CM | POA: Insufficient documentation

## 2014-10-25 NOTE — Assessment & Plan Note (Addendum)
Lump feels as though it is a spasm. Patient and family concerned it could be something more worrisome. She is neurologically intact. Will obtain US to look closer at the area.

## 2014-10-25 NOTE — Assessment & Plan Note (Addendum)
Patient with what sounds like a TIA with slurred speech resolving in 1.5 hours. Could potentially also be related to not taking her insulin or to accidentally taking tramadol. She has no neurological deficits at this time and has not had recurrence of the episode. Will obtain MRI brain to further evaluate. Lipid panel and A1c for risk stratification. She is already on aspirin. Consider increasing statin in the future. Given return precautions.   Precepted with Dr Walker Kehr.

## 2014-10-25 NOTE — Progress Notes (Signed)
Patient ID: Anna [name], female   DOB: [birth year], 69 y.o.   MRN: [id number]  [name], MD Phone: [phone]  Anna [name] is a 69 y.o. female who presents today for same day appointment.  ED f/u for TIA. Patient notes on 10/21/14 she develops voice change, slurred speech, sore throat, overall weakness, and sleepiness. She also had dizziness and light headedness. She was taken to the ED by EMS. Notes that her symptoms improved slightly after receiving fluids in EMS, though they returned by the time she was seen in the ED. Overall symptoms lasted 1.5 hours. She had lab work and CT scan in the ED that were notable for a glucose of 180 and >1000 glucose in her urine. CT scan unremarkable. She was discharged to follow-up with PCP for further work-up. She denies fevers, HA, CP, abdominal pain, N, V, D, and dysuria. She does notes mild heavy breathing during the episode, though none since. Of note she did not take her insulin the morning of this event. She also believes she may have taken tramadol by accident that day.   Left shoulder lesion: patient also notes a lump in the area of her left mid trapezius. This has been there for several weeks. It started out big and now has begun to improve. She notes some mild discomfort with rotation of her neck to the left. No weakness or numbness of upper extremities.  Patient is a former smoker.   ROS: Per HPI   Physical Exam Filed Vitals:   10/23/14 1115  BP: 107/71  Pulse: 67    Gen: Well NAD HEENT: PERRL,  MMM Lungs: CTABL Nl WOB Heart: RRR  Neuro: vision grossly intact, CN 3-12 intact, 5/5 strength in bilateral biceps, triceps, grip, quads, hamstrings, plantar and dorsiflexion, sensation to light touch intact in bilateral UE and LE, normal gait, 2+ patellar reflexes, negative rhomberg, no pronator drift MSK: left mid trapezius with what feels like a spasm of the muscle producing a lump in that area, there is no defined mass, there  is no tenderness, there is no erythema, there is no fluctuance, she has full ROM of her neck and bilateral shoulders Exts: Non edematous BL  LE, warm and well perfused.    Assessment/Plan: Please see individual problem list.  [name], MD [place] PGY-3

## 2014-10-25 NOTE — Telephone Encounter (Signed)
Attempted to call patient to inform of [name] results. It went straight to VM. Advised to call back for results. [name] did not reveal any abnormalities. Most likely this was a muscle spasm, though if this does not continue to improved we can consider MRI in the future to further evaluate the area. She should follow-up with her PCP regarding this issue.

## 2014-11-02 NOTE — Progress Notes (Signed)
Addendum: Diagnosis code for lipid panel changed to G45.9.

## 2014-11-03 ENCOUNTER — Ambulatory Visit
Admission: RE | Admit: 2014-11-03 | Discharge: 2014-11-03 | Disposition: A | Discharge status: Home / Self Care | Payer: Commercial Managed Care - HMO | Source: Ambulatory Visit | Attending: Family Medicine | Admitting: Family Medicine

## 2014-11-03 ENCOUNTER — Inpatient Hospital Stay: Admission: RE | Admit: 2014-11-03 | Payer: Medicare PPO | Source: Ambulatory Visit

## 2014-11-03 DIAGNOSIS — R4781 Slurred speech: Secondary | ICD-10-CM

## 2014-11-09 ENCOUNTER — Telehealth: Payer: Self-pay | Admitting: Family Medicine

## 2014-11-09 NOTE — Telephone Encounter (Signed)
Called patient to advise of MRI. Advised no acute changes, though there are chronic microvascular changes noted. Discussed that these are often seen in older adults with chronic medical issues such as DM, HTN, and HLD, of which she has all three. Discussed that we would need to continue to monitor this and be aware that she has these changes. It appears that she has good risk factor modification at this time and should continue to take her medications for DM, HTN, and HLD. She is additionally on an aspirin. She is to keep follow-up appointment with Dr Berkley Harvey next month. Advised to seek medical attention if she has recurrence of symptoms. Will forward to PCP as FYI.

## 2014-11-27 ENCOUNTER — Encounter: Payer: Self-pay | Admitting: Family Medicine

## 2014-11-27 ENCOUNTER — Ambulatory Visit (INDEPENDENT_AMBULATORY_CARE_PROVIDER_SITE_OTHER): Payer: Commercial Managed Care - HMO | Admitting: Family Medicine

## 2014-11-27 ENCOUNTER — Other Ambulatory Visit: Payer: Self-pay | Admitting: Family Medicine

## 2014-11-27 VITALS — BP 123/79 | HR 93 | Temp 98.7°F | Ht 62.0 in | Wt 169.0 lb

## 2014-11-27 DIAGNOSIS — F329 Major depressive disorder, single episode, unspecified: Secondary | ICD-10-CM | POA: Diagnosis not present

## 2014-11-27 DIAGNOSIS — Z1231 Encounter for screening mammogram for malignant neoplasm of breast: Secondary | ICD-10-CM

## 2014-11-27 DIAGNOSIS — E2839 Other primary ovarian failure: Secondary | ICD-10-CM

## 2014-11-27 DIAGNOSIS — I10 Essential (primary) hypertension: Secondary | ICD-10-CM | POA: Diagnosis not present

## 2014-11-27 DIAGNOSIS — Z13820 Encounter for screening for osteoporosis: Secondary | ICD-10-CM

## 2014-11-27 DIAGNOSIS — N952 Postmenopausal atrophic vaginitis: Secondary | ICD-10-CM

## 2014-11-27 DIAGNOSIS — F32A Depression, unspecified: Secondary | ICD-10-CM

## 2014-11-27 DIAGNOSIS — Z780 Asymptomatic menopausal state: Secondary | ICD-10-CM

## 2014-11-27 DIAGNOSIS — E785 Hyperlipidemia, unspecified: Secondary | ICD-10-CM

## 2014-11-27 DIAGNOSIS — E119 Type 2 diabetes mellitus without complications: Secondary | ICD-10-CM

## 2014-11-27 DIAGNOSIS — Z87891 Personal history of nicotine dependence: Secondary | ICD-10-CM

## 2014-11-27 DIAGNOSIS — G459 Transient cerebral ischemic attack, unspecified: Secondary | ICD-10-CM

## 2014-11-27 MED ORDER — METOPROLOL SUCCINATE ER 25 MG PO TB24: 25.0000 mg | ORAL_TABLET | Freq: Every day | ORAL | Status: DC

## 2014-11-27 MED ORDER — AMLODIPINE BESYLATE 5 MG PO TABS
5.0000 mg | ORAL_TABLET | Freq: Every day | ORAL | Status: DC
Start: 2014-11-27 — End: 2017-04-13

## 2014-11-27 MED ORDER — ASPIRIN 81 MG PO TABS: 81.0000 mg | ORAL_TABLET | Freq: Every day | ORAL | Status: DC

## 2014-11-27 MED ORDER — ZOSTER VACCINE LIVE 19400 UNT/0.65ML ~~LOC~~ SOLR: 0.6500 mL | Freq: Once | SUBCUTANEOUS | Status: DC

## 2014-11-27 MED ORDER — SERTRALINE HCL 50 MG PO TABS: 50.0000 mg | ORAL_TABLET | Freq: Every day | ORAL | Status: DC

## 2014-11-27 MED ORDER — ATORVASTATIN CALCIUM 20 MG PO TABS: 20.0000 mg | ORAL_TABLET | Freq: Every day | ORAL | Status: DC

## 2014-11-27 MED ORDER — BIMATOPROST 0.01 % OP SOLN: 1.0000 [drp] | Freq: Every day | OPHTHALMIC | Status: DC

## 2014-11-27 MED ORDER — QUINAPRIL-HYDROCHLOROTHIAZIDE 20-25 MG PO TABS: 1.0000 | ORAL_TABLET | Freq: Every day | ORAL | Status: DC

## 2014-11-27 MED ORDER — CANAGLIFLOZIN 100 MG PO TABS: 100.0000 mg | ORAL_TABLET | Freq: Every day | ORAL | Status: DC

## 2014-11-27 MED ORDER — INSULIN GLARGINE 100 UNIT/ML ~~LOC~~ SOLN: 60.0000 [IU] | Freq: Every day | SUBCUTANEOUS | Status: DC

## 2014-11-27 NOTE — Assessment & Plan Note (Signed)
Well controlled - continue current statin

## 2014-11-27 NOTE — Assessment & Plan Note (Signed)
Well controlled. Continue current medications  

## 2014-11-27 NOTE — Assessment & Plan Note (Signed)
Well controled  - Foot exam performed  - Continue current medication

## 2014-11-27 NOTE — Assessment & Plan Note (Signed)
Discuss vaginal premarin - She will consider

## 2014-11-27 NOTE — Patient Instructions (Signed)
It was great seeing you today.   1. Continue taking medications as prescribed 2. I have ordered Dexa scan to check for osteoporosis   Please bring all your medications to every doctors visit  Sign up for My Chart to have easy access to your labs results, and communication with your Primary care physician.  Next Appointment  Please call to make an appointment with Dr Berkley Harvey in 3 months   I look forward to talking with you again at our next visit. If you have any questions or concerns before then, please call the clinic at [phone].  Take Care,   Dr Phill Myron

## 2014-11-27 NOTE — Progress Notes (Signed)
  Patient name: Anna [name] MRN [id number]  Date of birth: [birth year]  CC & HPI:  Anna [name] is a 69 y.o. female presenting today for DM, HTN, HLD.   DIABETES  Lab Results  Component Value Date   HGBA1C 7.7 10/23/2014    Symptoms of Hypoglycemia? no  Comorbid Symptoms:  no Neuropathy: no Vision problems  Medication Side Effects: denies UTI, dizziness, hypotension, LE swelling  CHRONIC HYPERTENSION BP Readings from Last 3 Encounters:  11/27/14 123/79  10/23/14 107/71  10/21/14 120/63    Disease Monitoring  Chest pain: no   Dyspnea: no   Claudication: no  Medication Side Effects: Denies cough, angioedema,lightheadedness, rash; LE edema  HLD Lab Results  Component Value Date   LDLCALC 42 10/23/2014    Medication Compliance: yes  Side Effects?: no muscle pain or weakness, RUQ pain; jaundice  Medication compliance: yes   Preventitive Healthcare:  Smoking: Quit Jan 2016   Diet: Discussed low salt, carb modified   Vaccinations reveiwed:   ROS: See HPI   Medical & Surgical Hx:  Reviewed  Medications & Allergies: Reviewed  Social History: Reviewed:   Objective Findings:  Vitals: BP 123/79 mmHg  Pulse 93  Temp(Src) 98.7 F (37.1 C) (Oral)  Ht 5\' 2"  (1.575 m)  Wt 169 lb (76.658 kg)  BMI 30.90 kg/m2  Gen: NAD CV: RRR w/o m/r/g, pulses +2 b/l Resp: CTAB w/ normal respiratory effort Lower Ext: No skin changes; No edema; distal pulses intact; Calves nontender Foot Exam: Pulses 2+; No Ulcers, bruises or cuts; Monofilament testing: Sensation intact b/l   Assessment & Plan:   Please See Problem Focused Assessment & Plan

## 2014-12-05 ENCOUNTER — Ambulatory Visit (HOSPITAL_COMMUNITY)
Admission: RE | Admit: 2014-12-05 | Discharge: 2014-12-05 | Disposition: A | Discharge status: Home / Self Care | Payer: Commercial Managed Care - HMO | Source: Ambulatory Visit | Attending: Family Medicine | Admitting: Family Medicine

## 2014-12-05 DIAGNOSIS — Z780 Asymptomatic menopausal state: Secondary | ICD-10-CM | POA: Insufficient documentation

## 2014-12-05 DIAGNOSIS — Z1231 Encounter for screening mammogram for malignant neoplasm of breast: Secondary | ICD-10-CM | POA: Diagnosis not present

## 2014-12-05 DIAGNOSIS — Z13820 Encounter for screening for osteoporosis: Secondary | ICD-10-CM | POA: Diagnosis not present

## 2014-12-05 DIAGNOSIS — E2839 Other primary ovarian failure: Secondary | ICD-10-CM

## 2015-03-02 ENCOUNTER — Encounter: Payer: Self-pay | Admitting: Family Medicine

## 2015-03-02 ENCOUNTER — Ambulatory Visit (INDEPENDENT_AMBULATORY_CARE_PROVIDER_SITE_OTHER): Payer: Commercial Managed Care - HMO | Admitting: Family Medicine

## 2015-03-02 VITALS — BP 124/77 | HR 76 | Temp 99.0°F | Ht 61.0 in | Wt 168.4 lb

## 2015-03-02 DIAGNOSIS — E119 Type 2 diabetes mellitus without complications: Secondary | ICD-10-CM | POA: Diagnosis not present

## 2015-03-02 DIAGNOSIS — N183 Chronic kidney disease, stage 3 unspecified: Secondary | ICD-10-CM

## 2015-03-02 DIAGNOSIS — R1013 Epigastric pain: Secondary | ICD-10-CM | POA: Insufficient documentation

## 2015-03-02 LAB — POCT GLYCOSYLATED HEMOGLOBIN (HGB A1C): Hemoglobin A1C: 9.9

## 2015-03-02 MED ORDER — PANTOPRAZOLE SODIUM 40 MG PO TBEC: 40.0000 mg | DELAYED_RELEASE_TABLET | Freq: Every day | ORAL | Status: DC

## 2015-03-02 MED ORDER — ZOSTER VACCINE LIVE 19400 UNT/0.65ML ~~LOC~~ SOLR: 0.6500 mL | Freq: Once | SUBCUTANEOUS | Status: DC

## 2015-03-02 NOTE — Assessment & Plan Note (Signed)
Pertinent S&O  Pressure-like epigastric pain occurring for 20-30 minutes daily.  Associated with activity or exercise  History of gastric ulcers and H. pylori 2009, by EGD  Some GER symptoms, and occasional NSAID use Assessment  Most likely related to GERD, however possibly due to H. pylori, gastric ulcers given hx Plan  Can't perform H pylori breath test today due to excessive tums use  Patient opted for trial of PPI instead of H. pylori breath test in 2 weeks after no tums or PPI use  She will call to report symptoms in 2 weeks  We'll continue PPI for 1-2 months, then stop.  If symptoms worsen or recur after PPI use, will refer back to gastroenterology for EGD

## 2015-03-02 NOTE — Patient Instructions (Signed)
It was great seeing you today.   1. Check her blood sugar first thing every morning prior to taking medications or eating breakfast.  Call the clinic with those numbers in 2 weeks 2. Take Protonix 40 mg once daily.  When you call back in 2 weeks, letting me know how this is working.  3. Stop drinking regular sodas   Please bring all your medications to every doctors visit  Sign up for My Chart to have easy access to your labs results, and communication with your Primary care physician.  Next Appointment  Please make an appointment with Dr Berkley Harvey in 1 month   I look forward to talking with you again at our next visit. If you have any questions or concerns before then, please call the clinic at [phone].  Take Care,   Dr Phill Myron

## 2015-03-02 NOTE — Assessment & Plan Note (Signed)
A1c elevated today, likely due to regular Cokes 2-3 times daily - She wishes not to making medication adjustments today, but to work on cutting back or regular soda - She will check fasting blood sugars daily for 2 weeks and call with those results - Follow-up in one month - We'll recheck creatinine at that time

## 2015-03-02 NOTE — Progress Notes (Signed)
  Patient name: Anna [name] MRN [id number]  Date of birth: January 16, 1946  CC & HPI:  Anna [name] is a 69 y.o. female presenting today for DM and epigastric pain.   DIABETES   Blood Sugar Ranges: not checking often  Symptoms of Hypoglycemia? no  Comorbid Symptoms: no Chest pain; no SOB; no Neuropathy: on Vision problems  Medication Compliance: yes   Medication Side Effects: no  Counseling  Diet pattern: Drinks several regular Cokes daily   Epigastric pain - She reports pressure-like epigastric pain for the past several weeks.  It occurs almost daily and is usually associated with food.  Not associated with exertion or activity.  Not associated with shortness of breath or diaphoresis.  He reports pain is similar to previous pain from ulcers but not as severe.  Pain usually lasts for 20-30 minutes, usually in the evenings (5:00) , but is nonexistent for majority of the day.  Reports pain was well-controlled in the past with PPI, which was discontinued.  History of EGD revealing H. pylori and gastric ulcers in 2009.  She has been taking times to treat pain.  Denies any melanoma or hematochezia.   ROS: See HPI   Medical & Surgical Hx:  Reviewed  Medications & Allergies: Reviewed  Social History: Reviewed:   Objective Findings:  Vitals: BP 124/77 mmHg  Pulse 76  Temp(Src) 99 F (37.2 C) (Oral)  Ht 5\' 1"  (1.549 m)  Wt 168 lb 6 oz (76.374 kg)  BMI 31.83 kg/m2  Gen: NAD CV: RRR w/o m/r/g, pulses +2 b/l Resp: CTAB w/ normal respiratory effort GI: No skin changes; BS + x 4 quads; No tenderness or masses  Assessment & Plan:   Please See Problem Focused Assessment & Plan

## 2015-06-06 ENCOUNTER — Ambulatory Visit (INDEPENDENT_AMBULATORY_CARE_PROVIDER_SITE_OTHER): Payer: Commercial Managed Care - HMO | Admitting: Family Medicine

## 2015-06-06 ENCOUNTER — Encounter: Payer: Self-pay | Admitting: Family Medicine

## 2015-06-06 VITALS — BP 141/76 | HR 68 | Temp 98.1°F | Ht 61.0 in | Wt 166.5 lb

## 2015-06-06 DIAGNOSIS — Z23 Encounter for immunization: Secondary | ICD-10-CM

## 2015-06-06 DIAGNOSIS — I10 Essential (primary) hypertension: Secondary | ICD-10-CM

## 2015-06-06 DIAGNOSIS — E785 Hyperlipidemia, unspecified: Secondary | ICD-10-CM | POA: Diagnosis not present

## 2015-06-06 DIAGNOSIS — R1013 Epigastric pain: Secondary | ICD-10-CM | POA: Diagnosis not present

## 2015-06-06 DIAGNOSIS — E119 Type 2 diabetes mellitus without complications: Secondary | ICD-10-CM

## 2015-06-06 LAB — CBC
HCT: 38 % (ref 36.0–46.0)
HEMOGLOBIN: 12.7 g/dL (ref 12.0–15.0)
MCH: 27.8 pg (ref 26.0–34.0)
MCHC: 33.4 g/dL (ref 30.0–36.0)
MCV: 83.2 fL (ref 78.0–100.0)
MPV: 10.5 fL (ref 8.6–12.4)
Platelets: 288 10*3/uL (ref 150–400)
RBC: 4.57 MIL/uL (ref 3.87–5.11)
RDW: 14.8 % (ref 11.5–15.5)
WBC: 6.9 10*3/uL (ref 4.0–10.5)

## 2015-06-06 LAB — POCT GLYCOSYLATED HEMOGLOBIN (HGB A1C): Hemoglobin A1C: 7.9

## 2015-06-06 MED ORDER — PANTOPRAZOLE SODIUM 40 MG PO TBEC: 40.0000 mg | DELAYED_RELEASE_TABLET | Freq: Every day | ORAL | Status: DC

## 2015-06-06 NOTE — Progress Notes (Signed)
  Patient name: Anna [name] MRN [id number]  Date of birth: [birth year]  CC & HPI:  Anna [name] is a 69 y.o. female presenting today for DM, HTN and GERD.   DIABETES   Blood Sugar Ranges: not checking regularly  Symptoms of Hypoglycemia? no  Comorbid Symptoms: no Chest pain; no SOB; no Neuropathy: no Vision problems  Medication Compliance: yes   Medication Side Effects: none  CHRONIC HYPERTENSION  BP Readings from Last 3 Encounters:  06/06/15 141/76  03/02/15 124/77  11/27/14 123/79    Disease Monitoring  Blood pressure range outside clinc: no  Chest pain: no   Dyspnea: no  Medication Side Effects: no Dizziness/lightheadedness;    Preventitive Healthcare:   History  Smoking status  . Former Smoker -- 39 years  . Types: Cigarettes  . Quit date: 10/09/2014  Smokeless tobacco  . Never Used    Comment: 1 cig a week   GERD - Continues to endorse Post -prandial epigastric burning tightness  - Also reports acid reflux symptoms and occasionally feeling like things are sticking in her throat - Denies regurgitating food - Denies trouble swallowing or breathing - She was unable to get her Protonix filled and has continued to use tums daily - Previously seen by [facility] for colonoscopy, but denies EGD   ROS: See HPI   Medical & Surgical Hx:  Reviewed  Medications & Allergies: Reviewed  Social History: Reviewed:   Objective Findings:  Vitals: BP 141/76 mmHg  Pulse 68  Temp(Src) 98.1 F (36.7 C) (Oral)  Ht 5\' 1"  (1.549 m)  Wt 166 lb 8 oz (75.524 kg)  BMI 31.48 kg/m2  Gen: NAD CV: RRR w/o m/r/g, pulses +2 b/l Resp: CTAB w/ normal respiratory effort GI: No skin changes; BS + x 4 quads; No tenderness or masses  Assessment & Plan:   HYPERTENSION, BENIGN SYSTEMIC BP borderline today but has been well controlled previously - Continue current hypertension medications.  Follow-up at next visit in 2-3 months  DM type 2 (diabetes mellitus, type 2) A1c  improved after decreasing reg soda use to several times a week - Continue Lantus and Invokana - Encouraged to quit drinking regular soda - Follow-up in 3 months  Abdominal pain, epigastric Continued epigastric pain and reflux symptoms as well as Tums use - Start PPI as previously recommended; Protonix daily - Consider GI referral for endoscopy if symptoms not improved with PPI; she will call in 2-3 weeks to report symptoms

## 2015-06-06 NOTE — Assessment & Plan Note (Signed)
BP borderline today but has been well controlled previously - Continue current hypertension medications.  Follow-up at next visit in 2-3 months

## 2015-06-06 NOTE — Assessment & Plan Note (Signed)
A1c improved after decreasing reg soda use to several times a week - Continue Lantus and Invokana - Encouraged to quit drinking regular soda - Follow-up in 3 months

## 2015-06-06 NOTE — Assessment & Plan Note (Signed)
Continued epigastric pain and reflux symptoms as well as Tums use - Start PPI as previously recommended; Protonix daily - Consider GI referral for endoscopy if symptoms not improved with PPI; she will call in 2-3 weeks to report symptoms

## 2015-06-06 NOTE — Patient Instructions (Signed)
It was great seeing you today.   1. I have refilled your Protonix.  Take this daily for your reflux and heartburn.  2. Call the clinic if you're sensation of food sticking has not resolved in 2-3 weeks and I will refer you back to gastroenterologist.    Please bring all your medications to every doctors visit  Sign up for My Chart to have easy access to your labs results, and communication with your Primary care physician.  Next Appointment  Please call to make an appointment with Dr Berkley Harvey in 3 months for your diabetes and blood pressure   I look forward to talking with you again at our next visit. If you have any questions or concerns before then, please call the clinic at [phone].  Take Care,   Dr Phill Myron

## 2015-06-07 LAB — COMPREHENSIVE METABOLIC PANEL
ALBUMIN: 4.3 g/dL (ref 3.6–5.1)
ALK PHOS: 81 U/L (ref 33–130)
ALT: 17 U/L (ref 6–29)
AST: 19 U/L (ref 10–35)
BILIRUBIN TOTAL: 0.3 mg/dL (ref 0.2–1.2)
BUN: 19 mg/dL (ref 7–25)
CO2: 28 mmol/L (ref 20–31)
CREATININE: 1.02 mg/dL — AB (ref 0.50–0.99)
Calcium: 9.9 mg/dL (ref 8.6–10.4)
Chloride: 106 mmol/L (ref 98–110)
Glucose, Bld: 107 mg/dL — ABNORMAL HIGH (ref 65–99)
Potassium: 4.1 mmol/L (ref 3.5–5.3)
SODIUM: 143 mmol/L (ref 135–146)
TOTAL PROTEIN: 6.8 g/dL (ref 6.1–8.1)

## 2015-08-30 ENCOUNTER — Telehealth: Payer: Self-pay | Admitting: Family Medicine

## 2015-08-30 NOTE — Telephone Encounter (Signed)
Oneida Alar calling from [facility] wanting to speak to [name] about switching this pt to a [facility] provider. [name] left vmail with Tia. Thank you, Fonda Kinder, ASA

## 2015-08-30 NOTE — Telephone Encounter (Signed)
Returned call to [place].

## 2015-09-10 ENCOUNTER — Other Ambulatory Visit: Payer: Self-pay | Admitting: *Deleted

## 2015-11-08 ENCOUNTER — Other Ambulatory Visit: Payer: Self-pay

## 2015-11-08 DIAGNOSIS — Z1231 Encounter for screening mammogram for malignant neoplasm of breast: Secondary | ICD-10-CM

## 2015-11-12 ENCOUNTER — Ambulatory Visit (INDEPENDENT_AMBULATORY_CARE_PROVIDER_SITE_OTHER): Payer: Medicare Other | Admitting: Family Medicine

## 2015-11-12 ENCOUNTER — Encounter: Payer: Self-pay | Admitting: Family Medicine

## 2015-11-12 VITALS — BP 124/73 | HR 84 | Temp 98.0°F | Wt 166.0 lb

## 2015-11-12 DIAGNOSIS — R519 Headache, unspecified: Secondary | ICD-10-CM

## 2015-11-12 DIAGNOSIS — R51 Headache: Secondary | ICD-10-CM

## 2015-11-12 LAB — POCT SEDIMENTATION RATE: POCT SED RATE: 16 mm/h (ref 0–22)

## 2015-11-12 MED ORDER — IBUPROFEN 600 MG PO TABS
600.0000 mg | ORAL_TABLET | Freq: Three times a day (TID) | ORAL | Status: DC | PRN
Start: 2015-11-12 — End: 2016-12-05

## 2015-11-12 NOTE — Progress Notes (Addendum)
   Subjective:    Patient ID: Anna [name], female    DOB: 03/20/1946, 70 y.o.   MRN: [id number]  HPI FACE Pain - HEADACHE  Headache started 4 days ago.  It comes and goes.  Does not have currently Pain is achy around right face sometimes goes into head and to left side of face.  Sometimes her glasses feel tight or bother her above the ears Severity:  Moderate - feels like her face is tight Location: Right upper face and sometimes frontal headache  Medications tried: some pain medication not sure of the name - helped some Head trauma: no Sudden onset: no Previous similar headaches: years ago told had sinus Taking blood thinners: no History of cancer: not active  Symptoms Nose congestion stuffiness:  no Nausea vomiting: no Photophobia: no Noise sensitivity: no Double vision or loss of vision: no blurring or changes Fever: no Neck Stiffness: no Trouble walking or speaking: no  Patient thinks cause of headache might be: sinus   Review of Symptoms - see HPI PMH - Smoking status noted.       Review of Systems     Objective:   Physical Exam Alert nad  Temples not tender Maxillary sinus on R midly tender Eye - Pupils Equal Round Reactive to light, Extraocular movements intact, Fundi without hemorrhage or visible lesions, Conjunctiva without redness or discharge Nose:  External nasal examination shows no deformity or inflammation. Nasal mucosa are pink and moist without lesions or exudates. No septal dislocation or dislocation.No obstruction to airflow. Throat: normal mucosa, no exudate, uvula midline, no redness Neck:  No deformities, thyromegaly, masses, or tenderness noted.   Supple with full range of motion without pain. Lungs:  Normal respiratory effort, chest expands symmetrically. Lungs are clear to auscultation, no crackles or wheezes.        Assessment & Plan:   Face Pain - Headache - most consistent with atypical migraine or sinus pressure.  No signs of  intracranial disease.  Temples not tender but given age an new headache will check ESR to rule out temporal arteritis.  Treat symptomatically with ibuprofen and decongestant

## 2015-11-12 NOTE — Patient Instructions (Signed)
Good to see you today!  Thanks for coming in.  For the headache face pain  Take ibuprofen 600 mg up to three times daily with food  Use Afrin nasal spray - 2 sprays each side every 12 hours for 3 days only  .I will call you with your blood test result  If you have any visual symptoms - trouble seeing or spots you should be seen immediately  If this is not better in one week then come back

## 2015-11-20 ENCOUNTER — Emergency Department (HOSPITAL_COMMUNITY): Payer: Medicare Other

## 2015-11-20 ENCOUNTER — Observation Stay (HOSPITAL_COMMUNITY)
Admission: EM | Admit: 2015-11-20 | Discharge: 2015-11-22 | Disposition: A | Discharge status: Home / Self Care | Payer: Medicare Other | Attending: Family Medicine | Admitting: Family Medicine

## 2015-11-20 ENCOUNTER — Encounter (HOSPITAL_COMMUNITY): Payer: Self-pay | Admitting: Cardiology

## 2015-11-20 DIAGNOSIS — Z794 Long term (current) use of insulin: Secondary | ICD-10-CM | POA: Diagnosis not present

## 2015-11-20 DIAGNOSIS — E119 Type 2 diabetes mellitus without complications: Secondary | ICD-10-CM | POA: Insufficient documentation

## 2015-11-20 DIAGNOSIS — Z8673 Personal history of transient ischemic attack (TIA), and cerebral infarction without residual deficits: Secondary | ICD-10-CM | POA: Insufficient documentation

## 2015-11-20 DIAGNOSIS — E1141 Type 2 diabetes mellitus with diabetic mononeuropathy: Secondary | ICD-10-CM | POA: Diagnosis not present

## 2015-11-20 DIAGNOSIS — I129 Hypertensive chronic kidney disease with stage 1 through stage 4 chronic kidney disease, or unspecified chronic kidney disease: Secondary | ICD-10-CM | POA: Diagnosis not present

## 2015-11-20 DIAGNOSIS — Z86010 Personal history of colonic polyps: Secondary | ICD-10-CM | POA: Insufficient documentation

## 2015-11-20 DIAGNOSIS — G53 Cranial nerve disorders in diseases classified elsewhere: Secondary | ICD-10-CM

## 2015-11-20 DIAGNOSIS — H269 Unspecified cataract: Secondary | ICD-10-CM | POA: Diagnosis not present

## 2015-11-20 DIAGNOSIS — Z7982 Long term (current) use of aspirin: Secondary | ICD-10-CM | POA: Diagnosis not present

## 2015-11-20 DIAGNOSIS — Z6831 Body mass index (BMI) 31.0-31.9, adult: Secondary | ICD-10-CM | POA: Diagnosis not present

## 2015-11-20 DIAGNOSIS — E669 Obesity, unspecified: Secondary | ICD-10-CM | POA: Diagnosis not present

## 2015-11-20 DIAGNOSIS — E1149 Type 2 diabetes mellitus with other diabetic neurological complication: Secondary | ICD-10-CM | POA: Insufficient documentation

## 2015-11-20 DIAGNOSIS — K219 Gastro-esophageal reflux disease without esophagitis: Secondary | ICD-10-CM | POA: Insufficient documentation

## 2015-11-20 DIAGNOSIS — Z888 Allergy status to other drugs, medicaments and biological substances status: Secondary | ICD-10-CM | POA: Insufficient documentation

## 2015-11-20 DIAGNOSIS — N183 Chronic kidney disease, stage 3 (moderate): Secondary | ICD-10-CM | POA: Insufficient documentation

## 2015-11-20 DIAGNOSIS — H532 Diplopia: Principal | ICD-10-CM | POA: Insufficient documentation

## 2015-11-20 DIAGNOSIS — H409 Unspecified glaucoma: Secondary | ICD-10-CM | POA: Insufficient documentation

## 2015-11-20 DIAGNOSIS — H4922 Sixth [abducent] nerve palsy, left eye: Secondary | ICD-10-CM | POA: Insufficient documentation

## 2015-11-20 DIAGNOSIS — E1122 Type 2 diabetes mellitus with diabetic chronic kidney disease: Secondary | ICD-10-CM | POA: Diagnosis not present

## 2015-11-20 DIAGNOSIS — R51 Headache: Secondary | ICD-10-CM | POA: Diagnosis not present

## 2015-11-20 DIAGNOSIS — R1310 Dysphagia, unspecified: Secondary | ICD-10-CM | POA: Diagnosis not present

## 2015-11-20 DIAGNOSIS — E785 Hyperlipidemia, unspecified: Secondary | ICD-10-CM | POA: Insufficient documentation

## 2015-11-20 DIAGNOSIS — F329 Major depressive disorder, single episode, unspecified: Secondary | ICD-10-CM | POA: Insufficient documentation

## 2015-11-20 DIAGNOSIS — Z87891 Personal history of nicotine dependence: Secondary | ICD-10-CM | POA: Diagnosis not present

## 2015-11-20 DIAGNOSIS — H4920 Sixth [abducent] nerve palsy, unspecified eye: Secondary | ICD-10-CM | POA: Insufficient documentation

## 2015-11-20 DIAGNOSIS — I10 Essential (primary) hypertension: Secondary | ICD-10-CM | POA: Insufficient documentation

## 2015-11-20 DIAGNOSIS — M1990 Unspecified osteoarthritis, unspecified site: Secondary | ICD-10-CM | POA: Insufficient documentation

## 2015-11-20 LAB — COMPREHENSIVE METABOLIC PANEL
ALK PHOS: 97 U/L (ref 38–126)
ALT: 18 U/L (ref 14–54)
ANION GAP: 10 (ref 5–15)
AST: 19 U/L (ref 15–41)
Albumin: 4.2 g/dL (ref 3.5–5.0)
BILIRUBIN TOTAL: 0.5 mg/dL (ref 0.3–1.2)
BUN: 15 mg/dL (ref 6–20)
CALCIUM: 10.2 mg/dL (ref 8.9–10.3)
CO2: 28 mmol/L (ref 22–32)
Chloride: 105 mmol/L (ref 101–111)
Creatinine, Ser: 1.07 mg/dL — ABNORMAL HIGH (ref 0.44–1.00)
GFR, EST NON AFRICAN AMERICAN: 52 mL/min — AB (ref >60)
Glucose, Bld: 189 mg/dL — ABNORMAL HIGH (ref 65–99)
Potassium: 3.9 mmol/L (ref 3.5–5.1)
Sodium: 143 mmol/L (ref 135–145)
TOTAL PROTEIN: 7.3 g/dL (ref 6.5–8.1)

## 2015-11-20 LAB — CBC WITH DIFFERENTIAL/PLATELET
BASOS ABS: 0 10*3/uL (ref 0.0–0.1)
BASOS PCT: 0 %
EOS ABS: 0.1 10*3/uL (ref 0.0–0.7)
Eosinophils Relative: 1 %
HEMATOCRIT: 38.3 % (ref 36.0–46.0)
Hemoglobin: 12.6 g/dL (ref 12.0–15.0)
Lymphocytes Relative: 49 %
Lymphs Abs: 3.5 10*3/uL (ref 0.7–4.0)
MCH: 27.5 pg (ref 26.0–34.0)
MCHC: 32.9 g/dL (ref 30.0–36.0)
MCV: 83.4 fL (ref 78.0–100.0)
MONO ABS: 0.3 10*3/uL (ref 0.1–1.0)
MONOS PCT: 4 %
NEUTROS ABS: 3.3 10*3/uL (ref 1.7–7.7)
NEUTROS PCT: 46 %
Platelets: 267 10*3/uL (ref 150–400)
RBC: 4.59 MIL/uL (ref 3.87–5.11)
RDW: 13.5 % (ref 11.5–15.5)
WBC: 7.1 10*3/uL (ref 4.0–10.5)

## 2015-11-20 MED ORDER — SERTRALINE HCL 50 MG PO TABS
50.0000 mg | ORAL_TABLET | Freq: Every day | ORAL | Status: DC
  Administered 2015-11-21 – 2015-11-22 (×2): 50 mg via ORAL
  Filled 2015-11-20 (×3): qty 1

## 2015-11-20 MED ORDER — LATANOPROST 0.005 % OP SOLN
1.0000 [drp] | Freq: Every day | OPHTHALMIC | Status: DC
  Administered 2015-11-20 – 2015-11-21 (×2): 1 [drp] via OPHTHALMIC
  Filled 2015-11-20: qty 2.5

## 2015-11-20 MED ORDER — ENOXAPARIN SODIUM 40 MG/0.4ML ~~LOC~~ SOLN
40.0000 mg | SUBCUTANEOUS | Status: DC
  Administered 2015-11-21 – 2015-11-22 (×2): 40 mg via SUBCUTANEOUS
  Filled 2015-11-20 (×2): qty 0.4

## 2015-11-20 MED ORDER — PANTOPRAZOLE SODIUM 40 MG PO TBEC
40.0000 mg | DELAYED_RELEASE_TABLET | Freq: Every day | ORAL | Status: DC
  Administered 2015-11-21 – 2015-11-22 (×2): 40 mg via ORAL
  Filled 2015-11-20 (×2): qty 1

## 2015-11-20 MED ORDER — INSULIN GLARGINE 100 UNIT/ML ~~LOC~~ SOLN
30.0000 [IU] | Freq: Every day | SUBCUTANEOUS | Status: DC
  Filled 2015-11-20 (×2): qty 0.3

## 2015-11-20 MED ORDER — STROKE: EARLY STAGES OF RECOVERY BOOK
Freq: Once | Status: AC
  Administered 2015-11-20
  Filled 2015-11-20: qty 1

## 2015-11-20 MED ORDER — ATORVASTATIN CALCIUM 20 MG PO TABS
20.0000 mg | ORAL_TABLET | Freq: Every day | ORAL | Status: DC
  Administered 2015-11-21: 20 mg via ORAL
  Filled 2015-11-20: qty 1

## 2015-11-20 MED ORDER — ASPIRIN EC 81 MG PO TBEC
81.0000 mg | DELAYED_RELEASE_TABLET | Freq: Every day | ORAL | Status: DC
  Administered 2015-11-21 – 2015-11-22 (×2): 81 mg via ORAL
  Filled 2015-11-20 (×2): qty 1

## 2015-11-20 MED ORDER — INSULIN ASPART 100 UNIT/ML ~~LOC~~ SOLN
0.0000 [IU] | Freq: Three times a day (TID) | SUBCUTANEOUS | Status: DC
  Administered 2015-11-21 (×2): 5 [IU] via SUBCUTANEOUS
  Administered 2015-11-22: 2 [IU] via SUBCUTANEOUS
  Administered 2015-11-22: 3 [IU] via SUBCUTANEOUS

## 2015-11-20 MED ORDER — AMLODIPINE BESYLATE 5 MG PO TABS
5.0000 mg | ORAL_TABLET | Freq: Every day | ORAL | Status: DC
  Administered 2015-11-21 – 2015-11-22 (×2): 5 mg via ORAL
  Filled 2015-11-20 (×2): qty 1

## 2015-11-20 MED ORDER — ONDANSETRON HCL 4 MG/2ML IJ SOLN: 4.0000 mg | Freq: Three times a day (TID) | INTRAMUSCULAR | Status: AC | PRN

## 2015-11-20 NOTE — ED Notes (Signed)
Miller, MD at bedside.  

## 2015-11-20 NOTE — Consult Note (Signed)
Reason for Consult: Diplopia  Referring Physician: Noemi Chapel, M.D.    HPI: Anna [name] is a 70 y.o.  Right handed woman with new onset diplopia. She went to bed able to see fine and woke up with double vision. Diplopia only occurs looking to the left. Images are diagonal. She has been suffering from a left periorbital headache today as well. For the past week she had a bifrontal headache. This is unusual for her.   Upon questioning, she has suffered from intermittent dysphagia to solids for the past year. Denies droopy eyelids. No arm or leg weakness. No dyspnea.   She has diabetes.   CLINICAL DATA: PT WOKE UP THIS AM WITH BLURRED VISION IN LEFT EYE, ALMOST DOUBLE VISIONALSO HAS PRESSURE BEHIND THAT EYE AND BLOODY VESSELSPT HAS H/O HBP, CATARACTS AND GLAUCOMA  EXAM: CT HEAD WITHOUT CONTRAST  TECHNIQUE: Contiguous axial images were obtained from the base of the skull through the vertex without intravenous contrast.  COMPARISON: 10/21/2014  FINDINGS: The ventricles are normal in size and configuration. There are no parenchymal masses or mass effect, no evidence of an infarct, no extra-axial masses or abnormal fluid collections and no intracranial hemorrhage.  Normal globes and orbits.  Sinuses and mastoid air cells are clear.  IMPRESSION: Normal unenhanced CT scan of the brain.   Past Medical History  Diagnosis Date  . Diabetes mellitus   . Hypertension   . Cataract   . Glaucoma   . Arthritis   . Blood transfusion without reported diagnosis     40 plus yrs ago  . GERD (gastroesophageal reflux disease)   . Hyperlipidemia   . CKD (chronic kidney disease), stage III 08/10/2014     Past Surgical History  Procedure Laterality Date  . Abdominal hysterectomy    . Colonoscopy    . Polypectomy       Family History  Problem Relation Age of Onset  . Hyperlipidemia Mother   . Diabetes Mother   . Cancer Brother   . Hyperlipidemia Brother   .  Diabetes Daughter   . Colon cancer Neg Hx   . Colon polyps Neg Hx   . Rectal cancer Neg Hx   . Stomach cancer Neg Hx   . Esophageal cancer Neg Hx     Social History   Social History  . Marital Status: Divorced    Spouse Name: N/A  . Number of Children: 4  . Years of Education: 10   Occupational History  . Retired    Social History Main Topics  . Smoking status: Former Smoker -- 30 years    Types: Cigarettes    Quit date: 10/09/2014  . Smokeless tobacco: Never Used     Comment: 1 cig a week  . Alcohol Use: 0.6 oz/week    1 Cans of beer per week     Comment: occasional beer  . Drug Use: No  . Sexual Activity: Not on file   Other Topics Concern  . Not on file   Social History Narrative   Health Care POA:    Emergency Contact: Ayzha Prusha (daughter) [phone]   End of Life Plan: Pt will bring copy of living will to next visit   Who lives with you: By herself   Any pets: 0   Diet: Pt reports recently reducing her intake of starches and carbohydrates. Pt reports eating more fresh fruits and vegetables to help reduce her A1C.     Exercise: Pt reports using the treadmill for  60 minutes, 2-3 times per week.    Seatbelts: Pt reports wearing a seatbelt when in vehicle.   Nancy Fetter Exposure/Protection: Pt denies wearing any protection but mentioned she will start due to side effects of her recently prescribed medication.    Hobbies: Reading and going to movies with family.    Allergies  Allergen Reactions  . Metformin     REACTION: GI upset - Diarrhea    No current facility-administered medications on file prior to encounter.   Current Outpatient Prescriptions on File Prior to Encounter  Medication Sig Dispense Refill  . amLODipine (NORVASC) 5 MG tablet Take 1 tablet (5 mg total) by mouth daily. 90 tablet 3  . aspirin 81 MG tablet Take 1 tablet (81 mg total) by mouth daily. 30 tablet 11  . atorvastatin (LIPITOR) 20 MG tablet Take 1 tablet (20 mg total) by mouth daily. 90  tablet 3  . bimatoprost (LUMIGAN) 0.01 % SOLN Place 1 drop into both eyes at bedtime. 5 mL 1  . canagliflozin (INVOKANA) 100 MG TABS tablet Take 1 tablet (100 mg total) by mouth daily. 30 tablet 5  . ibuprofen (ADVIL,MOTRIN) 600 MG tablet Take 1 tablet (600 mg total) by mouth every 8 (eight) hours as needed. 15 tablet 0  . insulin glargine (LANTUS) 100 UNIT/ML injection Inject 0.6 mLs (60 Units total) into the skin daily. 20 mL 5  . metoprolol succinate (TOPROL XL) 25 MG 24 hr tablet Take 1 tablet (25 mg total) by mouth daily. 90 tablet 3  . pantoprazole (PROTONIX) 40 MG tablet Take 1 tablet (40 mg total) by mouth daily. 30 tablet 3  . quinapril-hydrochlorothiazide (ACCURETIC) 20-25 MG per tablet Take 1 tablet by mouth daily. 90 tablet 3  . sertraline (ZOLOFT) 50 MG tablet Take 1 tablet (50 mg total) by mouth daily. 30 tablet 3  . Syringe, Disposable, 1 ML MISC 1 each by Does not apply route daily. 90 each 3  . zoster vaccine live, PF, (ZOSTAVAX) 38756 UNT/0.65ML injection Inject 19,400 Units into the skin once. 1 each 0        Physical Exam:  General: BP 132/86 mmHg  Pulse 69  Temp(Src) 98.2 F (36.8 C) (Oral)  Resp 17  Wt 75.297 kg (166 lb)  SpO2 98%  CV: RRR no mumurs. Carotids no bruit bilaterally HEENT:  Fundoscopic exam reveals no papilledema. Normocephalic. Atraumatic.  Mental Status: Alert. Oriented. Fluent. Appropriate. Names. Repeats. Follows commands.  Cranial Nerves: PEARRL,left eye does not go completely lateral and becomes hypertropic, VFF, Facial sensation intact to temperature. No facial droop. Hearing intact bilaterally. Voice is not hoarse. Tongue protrudes midline. No ptosis but she has right eyelid closure weakness. No weakness of lip closure, jaw opening or closing.  Motor: No drift of either arm or leg extended. Normal muscle bulk, power and tone. Sensory: Equal to light touch and vibration throughout.  Coord: Finger to nose intact. Heel to shin intact.      Assessment: 1. Diplopia - It is likely the left inferior rectus because the eye drifts upward (hypertropic) when looking left lateral. But could be the lateral rectus. Diff dx: Diabetic CN III, Myasthenia Gravis (patient has right eyelid closure weakness with Bell's phenomenon) or ICA aneurysm in the intracavernous portion because of the headache, or increased ICP.    Recommendations:  1. MRI brain with and without. Her renal function will tolerate Gad 2. MRA head without contrast 3. Acetylcholine receptor binding antibodies 4. If the MRI and MRA is normal,  she may need a lumbar puncture to measure opening pressure. This could be done as an outpatient pending the ACh R abs. However, if her headache worsens or she develops meningeal signs or has evidence of an intracranial aneursym on MRA, then an inpatient LP might be needed.

## 2015-11-20 NOTE — ED Notes (Signed)
Neurologist, MD at bedside.

## 2015-11-20 NOTE — ED Notes (Signed)
Pt back to room 31 from CT scan

## 2015-11-20 NOTE — H&P (Signed)
[facility] Admission History and Physical Service Pager: [phone]  Patient name: Anna [name] Medical record number: [id number] Date of birth: 20-Sep-1946 Age: 70 y.o. Gender: female  Primary Care Provider: Phill Myron, MD Consultants: Neurology  Code Status:  Full   Chief Complaint: Diplopia   Assessment and Plan: NIDYA Anna [name] is a 70 y.o. female presenting with diplopia. PMH is significant for HTN, HLD, Glaucoma, T2DM, CKD 3, GERD  Diplopia: Worsed double vision with looking to the left. Neurologic exam intact.Patient has hx of chronic glaucoma and has not been taking her eye drops. Unlikely acute angle glaucoma as patient vision is not decreased, nor does patient have eye pain on admission. Differential to include myasthenia gravis vs. Acute infarct vs possible opthamopelgia. CT head negative for any acute infarcts.  - Admit to teaching service, attending Dr. Nori [name]  - Neuro recs - Will get MRA and MRI per Neuro  - Neuro checks q4 hrs and vitals per stroke work up  - Will get Lipid panel and HA1C - Consider opthalmology recs if stroke work up negative - Will get ACTH antibodies to work up Myasthenia gravis   HTN: Holding home Accuretic, amlodipine 5 mg, and metoprolol succinate 25mg  daily - Holding HTN meds for now due to stroke work up  - Continue home statin and aspirin   T2DM: home Lantus 60 units,  Invokana  -  Lantus 30 units and moderate SSI  -  CBGs  ACQHS   Depression: Home zoloft  - continue   Glaucoma: - Continue latanoprost  FEN/GI: PPI Prophylaxis: Lovenox   Disposition: Telemetry   History of Present Illness:  Anna [name] is a 70 y.o. female presenting with a hx of diplopia upon waking this morning in left eye. She states she has the diplopia mostly when she looks to the left, however not while looking forward or to the right as much. Patient states she had pressure in her left eye but no eye pain. No nausea or  vomiting. No fevers or chills. No recent illnesses. Had a headache last week. Has a history of glaucoma. Has not been taking her eye drops. She did have an episode of dizziness today from the diplopia.  Patient states her eyes were erythematous earlier today and now have cleared up significantly. Denies eye pain, floaters. No focal weakness or changes in sensation.   Review Of Systems: Per HPI with the following additions: No shortness of breath, palpitations, chest pain, nausea, vomiting, constipation, or diarrhea, dysuria,   Otherwise the remainder of the systems were negative.  Patient Active Problem List   Diagnosis Date Noted  . Diplopia 11/20/2015  . Abdominal pain, epigastric 03/02/2015  . TIA (transient ischemic attack) 10/25/2014  . CKD (chronic kidney disease), stage III 08/10/2014  . HLD (hyperlipidemia) 06/12/2014  . Right shoulder pain 06/12/2014  . Depression 12/03/2012  . History of tobacco use 06/09/2009  . VAGINITIS, ATROPHIC 10/25/2008  . COLONIC POLYPS 01/21/2007  . DM type 2 (diabetes mellitus, type 2) ([place]) 11/19/2006  . OBESITY, NOS 11/19/2006  . GLAUCOMA 11/19/2006  . CATARACT 11/19/2006  . HYPERTENSION, BENIGN SYSTEMIC 11/19/2006  . DIVERTICULOSIS OF COLON 11/19/2006  . PROTEINURIA 11/19/2006    Past Medical History: Past Medical History  Diagnosis Date  . Diabetes mellitus   . Hypertension   . Cataract   . Glaucoma   . Arthritis   . Blood transfusion without reported diagnosis     40 plus yrs ago  .  GERD (gastroesophageal reflux disease)   . Hyperlipidemia   . CKD (chronic kidney disease), stage III 08/10/2014    Past Surgical History: Past Surgical History  Procedure Laterality Date  . Abdominal hysterectomy    . Colonoscopy    . Polypectomy      Social History: Social History  Substance Use Topics  . Smoking status: Former Smoker -- 30 years    Types: Cigarettes    Quit date: 10/09/2014  . Smokeless tobacco: Never Used     Comment: 1  cig a week  . Alcohol Use: 0.6 oz/week    1 Cans of beer per week     Comment: occasional beer   Additional social history: none  Please also refer to relevant sections of EMR.  Family History: Family History  Problem Relation Age of Onset  . Hyperlipidemia Mother   . Diabetes Mother   . Cancer Brother   . Hyperlipidemia Brother   . Diabetes Daughter   . Colon cancer Neg Hx   . Colon polyps Neg Hx   . Rectal cancer Neg Hx   . Stomach cancer Neg Hx   . Esophageal cancer Neg Hx     Allergies and Medications: Allergies  Allergen Reactions  . Metformin     REACTION: GI upset - Diarrhea   No current facility-administered medications on file prior to encounter.   Current Outpatient Prescriptions on File Prior to Encounter  Medication Sig Dispense Refill  . amLODipine (NORVASC) 5 MG tablet Take 1 tablet (5 mg total) by mouth daily. 90 tablet 3  . aspirin 81 MG tablet Take 1 tablet (81 mg total) by mouth daily. 30 tablet 11  . atorvastatin (LIPITOR) 20 MG tablet Take 1 tablet (20 mg total) by mouth daily. 90 tablet 3  . bimatoprost (LUMIGAN) 0.01 % SOLN Place 1 drop into both eyes at bedtime. 5 mL 1  . canagliflozin (INVOKANA) 100 MG TABS tablet Take 1 tablet (100 mg total) by mouth daily. 30 tablet 5  . ibuprofen (ADVIL,MOTRIN) 600 MG tablet Take 1 tablet (600 mg total) by mouth every 8 (eight) hours as needed. 15 tablet 0  . insulin glargine (LANTUS) 100 UNIT/ML injection Inject 0.6 mLs (60 Units total) into the skin daily. 20 mL 5  . metoprolol succinate (TOPROL XL) 25 MG 24 hr tablet Take 1 tablet (25 mg total) by mouth daily. 90 tablet 3  . pantoprazole (PROTONIX) 40 MG tablet Take 1 tablet (40 mg total) by mouth daily. 30 tablet 3  . quinapril-hydrochlorothiazide (ACCURETIC) 20-25 MG per tablet Take 1 tablet by mouth daily. 90 tablet 3  . sertraline (ZOLOFT) 50 MG tablet Take 1 tablet (50 mg total) by mouth daily. 30 tablet 3  . zoster vaccine live, PF, (ZOSTAVAX) 13086  UNT/0.65ML injection Inject 19,400 Units into the skin once. 1 each 0    Objective: BP 165/86 mmHg  Pulse 73  Temp(Src) 98.8 F (37.1 C) (Oral)  Resp 18  Ht 5\' 1"  (1.549 m)  Wt 166 lb (75.297 kg)  BMI 31.38 kg/m2  SpO2 100% Exam: General: Patient lying bed, NAD  Eyes: PEERLA, EOMI, diplopia more noticeable with leftward gaze ENTM: moist mucosa membranes  Neck: no lymphadenopathy  Cardiovascular: RRR, no murmurs, rubs, gallops  Respiratory: CTAB, no wheezes, rhonchi,  Abdomen: BS+, no ttp, rebound, no guarding  MSK: No lower extremity edema  Skin: No skin lesions or rashes  Neuro: Cn 2-12 intact, Strength equal & normal in upper & lower extremities, finger  to nose wnl Psych: Cognition and judgment appear intact.   Labs and Imaging: CBC BMET   Recent Labs Lab 11/20/15 2045  WBC 7.1  HGB 12.6  HCT 38.3  PLT 267    Recent Labs Lab 11/20/15 2045  NA 143  K 3.9  CL 105  CO2 28  BUN 15  CREATININE 1.07*  GLUCOSE 189*  CALCIUM 10.2     TSH 1.972  Ct Head Wo Contrast  11/20/2015  CLINICAL DATA:  PT WOKE UP THIS AM WITH BLURRED VISION IN LEFT EYE, ALMOST DOUBLE VISIONALSO HAS PRESSURE BEHIND THAT EYE AND BLOODY VESSELSPT HAS H/O HBP, CATARACTS AND GLAUCOMA EXAM: CT HEAD WITHOUT CONTRAST TECHNIQUE: Contiguous axial images were obtained from the base of the skull through the vertex without intravenous contrast. COMPARISON:  10/21/2014 FINDINGS: The ventricles are normal in size and configuration. There are no parenchymal masses or mass effect, no evidence of an infarct, no extra-axial masses or abnormal fluid collections and no intracranial hemorrhage. Normal globes and orbits. Sinuses and mastoid air cells are clear. IMPRESSION: Normal unenhanced CT scan of the brain. Electronically Signed   By: Lajean Manes M.D.   On: 11/20/2015 20:28   EKG: low voltage, otherwise normal    Asiyah Cletis Media, MD 11/20/2015, 8:58 PM PGY-1, [place] Intern  pager: [phone], text pages welcome  I have read the above note and made revisions highlighted in blue.  Algis Greenhouse. Jerline Pain, [facility] Resident PGY-2 11/21/2015 1:30 AM

## 2015-11-20 NOTE — ED Notes (Signed)
Pt reports blurred vision in the left eye when she woke up this morning. Also reports a headache with the vision change. No weakness, numbness or tingling. Reports when she walks she feels like the room is spinning.

## 2015-11-20 NOTE — ED Provider Notes (Signed)
CSN: [id number]     Arrival date & time 11/20/15  1657 History   First MD Initiated Contact with Patient 11/20/15 1854     Chief Complaint  Patient presents with  . Eye Pain  . Blurred Vision     (Consider location/radiation/quality/duration/timing/severity/associated sxs/prior Treatment) HPI Comments: The patient is a 70 year old female, she presents to the hospital with a complaint of diplopia. She denies blurred vision, she denies a headache and she denies eye pain. She does have a history of glaucoma, she has not been using her medications recently but at this time she states that she has no complaints about her eyes other than diplopia specifically when she looks to the left. She awoke with the symptoms this morning, she did not have them yesterday, she has had no trouble with balance or gait and has no weakness or numbness of her arms or legs. She has no difficulty with speech, no chest pain or palpitations. The diplopia has been present all day long. Nothing makes it better, worse with looking to the left, no history of stroke  Patient is a 70 y.o. female presenting with eye pain. The history is provided by the patient.  Eye Pain    Past Medical History  Diagnosis Date  . Diabetes mellitus   . Hypertension   . Cataract   . Glaucoma   . Arthritis   . Blood transfusion without reported diagnosis     40 plus yrs ago  . GERD (gastroesophageal reflux disease)   . Hyperlipidemia   . CKD (chronic kidney disease), stage III 08/10/2014   Past Surgical History  Procedure Laterality Date  . Abdominal hysterectomy    . Colonoscopy    . Polypectomy     Family History  Problem Relation Age of Onset  . Hyperlipidemia Mother   . Diabetes Mother   . Cancer Brother   . Hyperlipidemia Brother   . Diabetes Daughter   . Colon cancer Neg Hx   . Colon polyps Neg Hx   . Rectal cancer Neg Hx   . Stomach cancer Neg Hx   . Esophageal cancer Neg Hx    Social History  Substance Use  Topics  . Smoking status: Former Smoker -- 30 years    Types: Cigarettes    Quit date: 10/09/2014  . Smokeless tobacco: Never Used     Comment: 1 cig a week  . Alcohol Use: 0.6 oz/week    1 Cans of beer per week     Comment: occasional beer   OB History    No data available     Review of Systems  Eyes: Positive for pain.  All other systems reviewed and are negative.     Allergies  Metformin  Home Medications   Prior to Admission medications   Medication Sig Start Date End Date Taking? Authorizing Provider  amLODipine (NORVASC) 5 MG tablet Take 1 tablet (5 mg total) by mouth daily. 11/27/14 12/28/15  Olam Idler, MD  aspirin 81 MG tablet Take 1 tablet (81 mg total) by mouth daily. 11/27/14   Olam Idler, MD  atorvastatin (LIPITOR) 20 MG tablet Take 1 tablet (20 mg total) by mouth daily. 11/27/14   Olam Idler, MD  bimatoprost (LUMIGAN) 0.01 % SOLN Place 1 drop into both eyes at bedtime. 11/27/14   Olam Idler, MD  canagliflozin (INVOKANA) 100 MG TABS tablet Take 1 tablet (100 mg total) by mouth daily. 11/27/14   Olam Idler, MD  ibuprofen (ADVIL,MOTRIN) 600 MG tablet Take 1 tablet (600 mg total) by mouth every 8 (eight) hours as needed. 11/12/15   Lind Covert, MD  insulin glargine (LANTUS) 100 UNIT/ML injection Inject 0.6 mLs (60 Units total) into the skin daily. 11/27/14   Olam Idler, MD  metoprolol succinate (TOPROL XL) 25 MG 24 hr tablet Take 1 tablet (25 mg total) by mouth daily. 11/27/14 12/14/15  Olam Idler, MD  pantoprazole (PROTONIX) 40 MG tablet Take 1 tablet (40 mg total) by mouth daily. 06/06/15   Olam Idler, MD  quinapril-hydrochlorothiazide (ACCURETIC) 20-25 MG per tablet Take 1 tablet by mouth daily. 11/27/14   Olam Idler, MD  sertraline (ZOLOFT) 50 MG tablet Take 1 tablet (50 mg total) by mouth daily. 11/27/14   Olam Idler, MD  Syringe, Disposable, 1 ML MISC 1 each by Does not apply route daily. 09/05/14   Olam Idler, MD  zoster vaccine  live, PF, (ZOSTAVAX) 91478 UNT/0.65ML injection Inject 19,400 Units into the skin once. 03/02/15   Olam Idler, MD   BP 156/90 mmHg  Pulse 76  Temp(Src) 98.2 F (36.8 C) (Oral)  Resp 16  Wt 166 lb (75.297 kg)  SpO2 99% Physical Exam  Constitutional: She appears well-developed and well-nourished. No distress.  HENT:  Head: Normocephalic and atraumatic.  Mouth/Throat: Oropharynx is clear and moist. No oropharyngeal exudate.  Eyes: Conjunctivae and EOM are normal. Pupils are equal, round, and reactive to light. Right eye exhibits no discharge. Left eye exhibits no discharge. No scleral icterus.  Neck: Normal range of motion. Neck supple. No JVD present. No thyromegaly present.  Cardiovascular: Normal rate, regular rhythm, normal heart sounds and intact distal pulses.  Exam reveals no gallop and no friction rub.   No murmur heard. Pulmonary/Chest: Effort normal and breath sounds normal. No respiratory distress. She has no wheezes. She has no rales.  Abdominal: Soft. Bowel sounds are normal. She exhibits no distension and no mass. There is no tenderness.  Musculoskeletal: Normal range of motion. She exhibits no edema or tenderness.  Lymphadenopathy:    She has no cervical adenopathy.  Neurological: She is alert. Coordination normal.  Speech is clear, cranial nerves III through XII are intact except for diplopia when she looks to the left, there is a disconjugate gaze to the left, normal gaze to the right, memory is intact, strength is normal in all 4 extremities including grips, sensation is intact to light touch and pinprick in all 4 extremities. Coordination as tested by finger-nose-finger is normal, no limb ataxia. Normal gait, normal reflexes at the patellar tendons bilaterally  Skin: Skin is warm and dry. No rash noted. No erythema.  Psychiatric: She has a normal mood and affect. Her behavior is normal.  Nursing note and vitals reviewed.   ED Course  Procedures (including critical  care time) Labs Review Labs Reviewed  CBC WITH DIFFERENTIAL/PLATELET  COMPREHENSIVE METABOLIC PANEL    Imaging Review Ct Head Wo Contrast  11/20/2015  CLINICAL DATA:  PT WOKE UP THIS AM WITH BLURRED VISION IN LEFT EYE, ALMOST DOUBLE VISIONALSO HAS PRESSURE BEHIND THAT EYE AND BLOODY VESSELSPT HAS H/O HBP, CATARACTS AND GLAUCOMA EXAM: CT HEAD WITHOUT CONTRAST TECHNIQUE: Contiguous axial images were obtained from the base of the skull through the vertex without intravenous contrast. COMPARISON:  10/21/2014 FINDINGS: The ventricles are normal in size and configuration. There are no parenchymal masses or mass effect, no evidence of an infarct, no extra-axial masses or abnormal  fluid collections and no intracranial hemorrhage. Normal globes and orbits. Sinuses and mastoid air cells are clear. IMPRESSION: Normal unenhanced CT scan of the brain. Electronically Signed   By: Lajean Manes M.D.   On: 11/20/2015 20:28   I have personally reviewed and evaluated these images and lab results as part of my medical decision-making.   EKG Interpretation   Date/Time:  Tuesday November 20 2015 19:40:54 EST Ventricular Rate:  68 PR Interval:  160 QRS Duration: 91 QT Interval:  435 QTC Calculation: 463 R Axis:   73 Text Interpretation:  Sinus rhythm Low voltage, extremity leads since last  tracing no significant change Confirmed by Nury Nebergall  MD, Trana Ressler ([id number]) on  11/20/2015 7:54:05 PM      MDM   Final diagnoses:  Diplopia    The patient has vital signs which are mildly hypertensive, her gross visual acuity is normal, we'll get official visual acuity testing, CT scan of the brain, discussed with neurology. She has had symptoms that she awoke this morning, she is outside of any stroke window if this should be a stroke though I suspect that it is not.  Concern for Stroke, d/w Neuro who is in agreement that this needs to be ruloed out.  Care was discussed with the neurologist, they will come to see the  patient. Care was also discussed with the family practice resident who will admit the patient. They request holding orders, this has been completed   Noemi Chapel, MD 11/20/15 2106

## 2015-11-21 ENCOUNTER — Observation Stay (HOSPITAL_COMMUNITY): Payer: Medicare Other

## 2015-11-21 DIAGNOSIS — E119 Type 2 diabetes mellitus without complications: Secondary | ICD-10-CM

## 2015-11-21 DIAGNOSIS — G53 Cranial nerve disorders in diseases classified elsewhere: Secondary | ICD-10-CM | POA: Diagnosis not present

## 2015-11-21 DIAGNOSIS — H4922 Sixth [abducent] nerve palsy, left eye: Secondary | ICD-10-CM | POA: Diagnosis not present

## 2015-11-21 DIAGNOSIS — H532 Diplopia: Secondary | ICD-10-CM

## 2015-11-21 DIAGNOSIS — I10 Essential (primary) hypertension: Secondary | ICD-10-CM

## 2015-11-21 DIAGNOSIS — E1149 Type 2 diabetes mellitus with other diabetic neurological complication: Secondary | ICD-10-CM | POA: Insufficient documentation

## 2015-11-21 DIAGNOSIS — Z794 Long term (current) use of insulin: Secondary | ICD-10-CM

## 2015-11-21 DIAGNOSIS — H4920 Sixth [abducent] nerve palsy, unspecified eye: Secondary | ICD-10-CM | POA: Insufficient documentation

## 2015-11-21 LAB — BASIC METABOLIC PANEL
Anion gap: 14 (ref 5–15)
BUN: 14 mg/dL (ref 6–20)
CHLORIDE: 104 mmol/L (ref 101–111)
CO2: 23 mmol/L (ref 22–32)
CREATININE: 1.01 mg/dL — AB (ref 0.44–1.00)
Calcium: 10.1 mg/dL (ref 8.9–10.3)
GFR calc Af Amer: >60 mL/min (ref >60)
GFR calc non Af Amer: 56 mL/min — ABNORMAL LOW (ref >60)
GLUCOSE: 292 mg/dL — AB (ref 65–99)
Potassium: 3.8 mmol/L (ref 3.5–5.1)
SODIUM: 141 mmol/L (ref 135–145)

## 2015-11-21 LAB — GLUCOSE, CAPILLARY
GLUCOSE-CAPILLARY: 263 mg/dL — AB (ref 65–99)
Glucose-Capillary: 205 mg/dL — ABNORMAL HIGH (ref 65–99)
Glucose-Capillary: 203 mg/dL — ABNORMAL HIGH (ref 65–99)
Glucose-Capillary: 106 mg/dL — ABNORMAL HIGH (ref 65–99)
Glucose-Capillary: 177 mg/dL — ABNORMAL HIGH (ref 65–99)

## 2015-11-21 LAB — CBC
HCT: 37.6 % (ref 36.0–46.0)
HEMOGLOBIN: 11.9 g/dL — AB (ref 12.0–15.0)
MCH: 26.5 pg (ref 26.0–34.0)
MCHC: 31.6 g/dL (ref 30.0–36.0)
MCV: 83.7 fL (ref 78.0–100.0)
Platelets: 240 10*3/uL (ref 150–400)
RBC: 4.49 MIL/uL (ref 3.87–5.11)
RDW: 13.6 % (ref 11.5–15.5)
WBC: 5.8 10*3/uL (ref 4.0–10.5)

## 2015-11-21 LAB — LIPID PANEL
Cholesterol: 183 mg/dL (ref 0–200)
HDL: 46 mg/dL (ref >40)
LDL CALC: 64 mg/dL (ref 0–99)
TRIGLYCERIDES: 365 mg/dL — AB (ref <150)
Total CHOL/HDL Ratio: 4 RATIO
VLDL: 73 mg/dL — ABNORMAL HIGH (ref 0–40)

## 2015-11-21 LAB — TSH: TSH: 1.972 u[IU]/mL (ref 0.350–4.500)

## 2015-11-21 MED ORDER — LISINOPRIL 20 MG PO TABS
20.0000 mg | ORAL_TABLET | Freq: Every day | ORAL | Status: DC
  Administered 2015-11-21 – 2015-11-22 (×2): 20 mg via ORAL
  Filled 2015-11-21 (×2): qty 1

## 2015-11-21 MED ORDER — POLYETHYLENE GLYCOL 3350 17 G PO PACK
17.0000 g | PACK | Freq: Every day | ORAL | Status: DC
Start: 2015-11-21 — End: 2015-11-21

## 2015-11-21 MED ORDER — METOPROLOL SUCCINATE ER 25 MG PO TB24
25.0000 mg | ORAL_TABLET | Freq: Every day | ORAL | Status: DC
  Administered 2015-11-21 – 2015-11-22 (×2): 25 mg via ORAL
  Filled 2015-11-21 (×2): qty 1

## 2015-11-21 MED ORDER — HYDROCHLOROTHIAZIDE 25 MG PO TABS
25.0000 mg | ORAL_TABLET | Freq: Every day | ORAL | Status: DC
  Administered 2015-11-21 – 2015-11-22 (×2): 25 mg via ORAL
  Filled 2015-11-21 (×2): qty 1

## 2015-11-21 MED ORDER — INSULIN GLARGINE 100 UNIT/ML ~~LOC~~ SOLN
30.0000 [IU] | Freq: Every day | SUBCUTANEOUS | Status: DC
  Administered 2015-11-21 – 2015-11-22 (×2): 30 [IU] via SUBCUTANEOUS
  Filled 2015-11-21 (×3): qty 0.3

## 2015-11-21 MED ORDER — ATORVASTATIN CALCIUM 40 MG PO TABS
40.0000 mg | ORAL_TABLET | Freq: Every day | ORAL | Status: DC
  Administered 2015-11-22: 40 mg via ORAL
  Filled 2015-11-21: qty 1

## 2015-11-21 MED ORDER — QUINAPRIL-HYDROCHLOROTHIAZIDE 20-25 MG PO TABS: 1.0000 | ORAL_TABLET | Freq: Every day | ORAL | Status: DC

## 2015-11-21 MED ORDER — POLYETHYLENE GLYCOL 3350 17 G PO PACK
17.0000 g | PACK | Freq: Every day | ORAL | Status: DC | PRN
  Administered 2015-11-21: 17 g via ORAL
  Filled 2015-11-21: qty 1

## 2015-11-21 NOTE — Progress Notes (Signed)
Family Medicine Teaching Service Daily Progress Note Intern Pager: [phone]  Patient name: Anna [name] Medical record number: [id number] Date of birth: [birth year] Age: 70 y.o. Gender: female  Primary Care Provider: Phill Myron, MD Consultants: Neurology  Code Status: Full   Pt Overview and Major Events to Date:   Assessment and Plan: Anna [name] is a 70 y.o. female presenting with diplopia. PMH is significant for HTN, HLD, Glaucoma, T2DM, CKD 3, GERD  Diplopia: Worsed double vision with looking to the left. Neurologic exam intact.Patient has hx of chronic glaucoma and has not been taking her eye drops. Unlikely acute angle glaucoma as patient vision is not decreased, nor does patient have eye pain on admission. Differential to include myasthenia gravis vs. Acute infarct vs possible opthamopelgia. CT head negative for any acute infarcts.  - Neuro recs, awaiting - MRA and MRI no acute findings noted  - Lipid panel ASCVD risk 21%, increase Lipitor to 40 mg  - HA1C pending; TSH wnl  - Consider opthalmology recs following neuro recs  - Will get ACTH antibodies to work up Myasthenia gravis    HTN: Holding home Accuretic, amlodipine 5 mg, and metoprolol succinate 25mg  daily - continue amlodipine 5 mg,  metoprolol 25 mg, accuretic  - Continue home statin and aspirin   T2DM: home Lantus 60 units, Invokana. CBGs 200s since admissions  - Lantus 30 units and moderate SSI  - CBGs ACQHS   Depression: Home zoloft  - continue   Glaucoma: - Continue latanoprost  FEN/GI: PPI Prophylaxis: Lovenox   Disposition: Telemetry   Subjective:  Patient doing well this morning, still having diplopia. No focal weakness, no changes sensations.   Objective: Temp:  [97.4 F (36.3 C)-98.8 F (37.1 C)] 97.4 F (36.3 C) (03/01 0602) Pulse Rate:  [62-78] 62 (03/01 0602) Resp:  [16-18] 16 (03/01 0602) BP: (127-166)/(67-90) 166/86 mmHg (03/01 0602) SpO2:  [95 %-100 %] 99 % (03/01  0602) Weight:  [166 lb (75.297 kg)] 166 lb (75.297 kg) (02/28 1706) Physical Exam: General: Patient doing well, no  NAD  Eyes: normal conjunctiva, EOMI,  Cardiovascular: RRR, no murmurs, no gallops  Respiratory: CTAB, no wheezes, rhonchi  Abdomen: BS+ no ttp, no rebound  Extremities: No lower extremity  Neuro: CN 2-12, upper and lower extremity strength intact.  Laboratory:  Recent Labs Lab 11/20/15 2045 11/21/15 0535  WBC 7.1 5.8  HGB 12.6 11.9*  HCT 38.3 37.6  PLT 267 240    Recent Labs Lab 11/20/15 2045 11/21/15 0535  NA 143 141  K 3.9 3.8  CL 105 104  CO2 28 23  BUN 15 14  CREATININE 1.07* 1.01*  CALCIUM 10.2 10.1  PROT 7.3  --   BILITOT 0.5  --   ALKPHOS 97  --   ALT 18  --   AST 19  --   GLUCOSE 189* 292*    Imaging/Diagnostic Tests: Ct Head Wo Contrast  11/20/2015  CLINICAL DATA:  PT WOKE UP THIS AM WITH BLURRED VISION IN LEFT EYE, ALMOST DOUBLE VISIONALSO HAS PRESSURE BEHIND THAT EYE AND BLOODY VESSELSPT HAS H/O HBP, CATARACTS AND GLAUCOMA EXAM: CT HEAD WITHOUT CONTRAST TECHNIQUE: Contiguous axial images were obtained from the base of the skull through the vertex without intravenous contrast. COMPARISON:  10/21/2014 FINDINGS: The ventricles are normal in size and configuration. There are no parenchymal masses or mass effect, no evidence of an infarct, no extra-axial masses or abnormal fluid collections and no intracranial hemorrhage. Normal globes and orbits. Sinuses  and mastoid air cells are clear. IMPRESSION: Normal unenhanced CT scan of the brain. Electronically Signed   By: Lajean Manes M.D.   On: 11/20/2015 20:28     Norvel Wenker Cletis Media, MD 11/21/2015, 7:19 AM PGY-1, [place] Intern pager: [phone], text pages welcome

## 2015-11-21 NOTE — Care Management Note (Signed)
Case Management Note  Patient Details  Name: Anna [name] MRN: [id number] Date of Birth: [birth year]  Subjective/Objective:                 Spoke with Allayne Stack and daughter in room. Patient in obs for diplopia. Lives with daughter. Does not have any DME, denies barriers to getting meds or follow up. Could not identify any needs CM could assist with at this time. Instructed on how to reach CM if needs arise.   Action/Plan:  CM will continue to follow.  Expected Discharge Date:                  Expected Discharge Plan:  Home/Self Care  In-House Referral:     Discharge planning Services  CM Consult  Post Acute Care Choice:    Choice offered to:     DME Arranged:    DME Agency:     HH Arranged:    HH Agency:     Status of Service:  In process, will continue to follow  Medicare Important Message Given:    Date Medicare IM Given:    Medicare IM give by:    Date Additional Medicare IM Given:    Additional Medicare Important Message give by:     If discussed at [place] of Stay Meetings, dates discussed:    Additional Comments:  Carles Collet, RN 11/21/2015, 2:18 PM

## 2015-11-21 NOTE — Progress Notes (Signed)
Subjective: No further HA. Wants to go home.  Continues to have diplopia when looking to the left and resolves when looking to the right.   Exam: Filed Vitals:   11/21/15 0602 11/21/15 0850  BP: 166/86 151/72  Pulse: 62 73  Temp: 97.4 F (36.3 C) 98.5 F (36.9 C)  Resp: 16 16        Gen: In bed, NAD MS: alert and oriented CN: PERRLA, TML, Face symmetric, sensation intact, right eye esotropic at rest but full EOM Motor: MAEW Sensory: grossly intact   Pertinent Labs: none  Current Facility-Administered Medications  Medication Dose Route Frequency Provider Last Rate Last Dose  . amLODipine (NORVASC) tablet 5 mg  5 mg Oral Daily Asiyah Cletis Media, MD   5 mg at 11/21/15 0924  . aspirin EC tablet 81 mg  81 mg Oral Daily Asiyah Cletis Media, MD   81 mg at 11/21/15 [id number]  . atorvastatin (LIPITOR) tablet 20 mg  20 mg Oral Daily Asiyah Cletis Media, MD   20 mg at 11/21/15 0924  . enoxaparin (LOVENOX) injection 40 mg  40 mg Subcutaneous Q24H Asiyah Cletis Media, MD   40 mg at 11/21/15 0924  . insulin aspart (novoLOG) injection 0-15 Units  0-15 Units Subcutaneous TID WC Asiyah Cletis Media, MD   5 Units at 11/21/15 0850  . insulin glargine (LANTUS) injection 30 Units  30 Units Subcutaneous Daily Dickie La, MD      . latanoprost (XALATAN) 0.005 % ophthalmic solution 1 drop  1 drop Both Eyes QHS Asiyah Cletis Media, MD   1 drop at 11/20/15 2334  . metoprolol succinate (TOPROL-XL) 24 hr tablet 25 mg  25 mg Oral Daily Asiyah Cletis Media, MD   25 mg at 11/21/15 0929  . ondansetron (ZOFRAN) injection 4 mg  4 mg Intravenous Q8H PRN Noemi Chapel, MD      . pantoprazole (PROTONIX) EC tablet 40 mg  40 mg Oral Daily Asiyah Cletis Media, MD   40 mg at 11/21/15 0924  . sertraline (ZOLOFT) tablet 50 mg  50 mg Oral Daily Asiyah Cletis Media, MD   50 mg at 11/21/15 [id number]     Etta Quill PA-C [facility] [phone]  Impression: Diplopia -Diff dx: Diabetic CN III, Myasthenia Gravis  (patient has right eyelid closure weakness with Bell's phenomenon). MRI/MRA show no abnormality  Seen with Dr. Silverio Decamp. Please see his attestation note for A/P for any additional work up recommendations.    Recommendations: 1) At this time agree with Myasthenia panel and follow up with out patient neurology    11/21/2015, 9:50 AM

## 2015-11-21 NOTE — Care Management Obs Status (Signed)
[place] NOTIFICATION   Patient Details  Name: Anna [name] MRN: [id number] Date of Birth: 23-Aug-1946   Medicare Observation Status Notification Given:  Yes    Carles Collet, RN 11/21/2015, 2:17 PM

## 2015-11-22 DIAGNOSIS — H532 Diplopia: Secondary | ICD-10-CM | POA: Diagnosis not present

## 2015-11-22 DIAGNOSIS — H4920 Sixth [abducent] nerve palsy, unspecified eye: Secondary | ICD-10-CM | POA: Diagnosis not present

## 2015-11-22 DIAGNOSIS — E119 Type 2 diabetes mellitus without complications: Secondary | ICD-10-CM | POA: Diagnosis not present

## 2015-11-22 DIAGNOSIS — E1149 Type 2 diabetes mellitus with other diabetic neurological complication: Secondary | ICD-10-CM

## 2015-11-22 DIAGNOSIS — G53 Cranial nerve disorders in diseases classified elsewhere: Secondary | ICD-10-CM

## 2015-11-22 LAB — HEMOGLOBIN A1C
HEMOGLOBIN A1C: 9.9 % — AB (ref 4.8–5.6)
Hgb A1c MFr Bld: 10.1 % — ABNORMAL HIGH (ref 4.8–5.6)
MEAN PLASMA GLUCOSE: 237 mg/dL
Mean Plasma Glucose: 243 mg/dL

## 2015-11-22 LAB — GLUCOSE, CAPILLARY
GLUCOSE-CAPILLARY: 146 mg/dL — AB (ref 65–99)
Glucose-Capillary: 173 mg/dL — ABNORMAL HIGH (ref 65–99)

## 2015-11-22 LAB — ACETYLCHOLINE RECEPTOR, BINDING

## 2015-11-22 MED ORDER — ATORVASTATIN CALCIUM 40 MG PO TABS: 40.0000 mg | ORAL_TABLET | Freq: Every day | ORAL | Status: DC

## 2015-11-22 NOTE — Discharge Instructions (Signed)
Ms. [place], [place] were admitted to rule-out stroke causing your double vision. Brain imaging was normal, and the cause of your double vision is thought to be inflammation of a nerve that controls movement in your left eye. Increased blood sugars is the suspected cause. Reducing consumption of sweets and refined carbohydrates, as well as increasing physical activity, help with blood sugar control. Continue to take your home medications.  Your statin medication to treat high cholesterol was increased to better prevent stroke.  Please return to the ED if you have any significant eye pain or loss of vision.   Diplopia Diplopia is the condition of having double vision or seeing two of a single object. There are many causes of diplopia. Some are not dangerous and can be easily corrected. Diplopia may also be a symptom of a serious medical problem. There are two types of diplopia.  Monocular diplopia. This is double vision that affects only one eye. Monocular diplopia is often caused by a clouding of the lens in your eye (cataract) or by disruptions in the way that your eye focuses light.  Binocular diplopia. This is double vision that affects both eyes. However, when you shut one eye, the double vision will go away. Binocular diplopia may be more serious. It can be caused by:  Problems with the nerves or muscles that are responsible for eye movement.  Neurologic diseases.  Thyroid problems.  Tumors.  An infection near your eyes.  A stroke. You may need to see a health care provider who specializes in eye conditions (ophthalmologist) or a nerve specialist (neurologist) to find the cause. HOME CARE INSTRUCTIONS  Tell your health care provider about any changes in your vision.  Do not drive or operate heavy machinery if diplopia interferes with your vision.  Keep all follow-up visits as directed by your health care provider. This is important. SEEK MEDICAL CARE IF:  Your diplopia gets  worse.  You develop any other symptoms along with your diplopia, such as:  Weakness.  Numbness.  Headache.  Eye pain.  Clumsiness.  Nausea.  Drooping eyelids.  Abnormal movement of one of your eyes. SEEK IMMEDIATE MEDICAL CARE IF:  You have sudden vision loss.  You suddenly get a very bad headache.  You have sudden weakness or numbness.  You suddenly lose the ability to speak, understand speech, or both.   This information is not intended to replace advice given to you by your health care provider. Make sure you discuss any questions you have with your health care provider.   Document Released: 07/10/2004 Document Revised: 01/23/2015 Document Reviewed: 08/02/2014 Elsevier Interactive Patient Education [employer].

## 2015-11-22 NOTE — Discharge Summary (Signed)
[facility] Discharge Summary  Patient name: Anna [name] Medical record number: [id number] Date of birth: 03/13/1946 Age: 70 y.o. 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 rate 18, height 5\' 1"  (1.549 m), weight 166 lb (75.297 kg), SpO2 99 %. General: Patient doing well, no NAD  Eyes: normal conjunctiva with minimal erythema, limited abduction of left eye that is intermittent Cardiovascular: RRR, no murmurs, no gallops  Respiratory: CTAB, no wheezes, rhonchi  Abdomen: BS+ no ttp, no rebound  Neuro: CNs intact except for CNVI, upper and lower extremity strength intact. Visual acuity 20/50 on left, 20/30 on right.   Brief Hospital Course:  Anna [name] is a 70 y.o. female who presented with diplopia x 1 day. PMH is significant for HTN, HLD, Glaucoma, T2DM, CKD 3, and GERD. Diplopia was worsened with looking to the left, and she also had esotropia of left eye and limited abduction. No loss of vision or complaint of floaters. She was admitted for further work-up and to rule-out stroke. CT head, MRA/MRI brain were negative for acute infarct. Neurology was consulted and believed most likely diagnosis was left abducens cranial nerve palsy in the setting of poor glycemic control. Hgb A1c was 10.1, compared to 7.9 in September 2016. Patient said she had  been eating a lot of sweets lately. ASCVD risk was caculated as 21%, so lipitor was increased to 40 mg daily. She initially complained of some difficulty swallowing, which has been going on for the past year, so an acetylcholine receptor binding antibody was checked for concern for myasthenia gravis. This resulted as negative, and swallowing difficulties had resolved by time of discharge. She also had intermittent headaches and conjunctivitis. LP was not performed, as she never developed meningeal signs and headache improved. Neurology recommended patient follow-up with ophthalmology, as corrective prisms could be beneficial if diplopia persists.  Issues for Follow Up:  1. Recent blood sugar control and what dietary modifications she has been making. 2. Inquire about improvement in double vision.  3. Ask if patient has been regularly using her bimatoprost eye drops.   Significant Procedures: None  Significant Labs and Imaging:   Recent Labs Lab 11/20/15 2045 11/21/15 0535  WBC 7.1 5.8  HGB 12.6 11.9*  HCT 38.3 37.6  PLT 267 240    Recent Labs Lab 11/20/15 2045 11/21/15 0535  NA 143 141  K 3.9 3.8  CL 105 104  CO2 28 23  GLUCOSE 189* 292*  BUN 15 14  CREATININE 1.07* 1.01*  CALCIUM 10.2 10.1  ALKPHOS 97  --   AST 19  --   ALT 18  --   ALBUMIN 4.2  --    Acetylcholine receptor binding ab: Negative (<0.03)  Results/Tests Pending at Time of Discharge: None  Discharge Medications:    Medication List    TAKE these medications        amLODipine 5 MG tablet  Commonly  known as:  NORVASC  Take 1 tablet (5 mg total) by mouth daily.     aspirin 81 MG tablet  Take 1 tablet (81 mg total) by mouth daily.     atorvastatin 40 MG tablet  Commonly known as:  LIPITOR  Take 1 tablet (40 mg total) by mouth daily.     bimatoprost 0.01 % Soln  Commonly known as:  LUMIGAN  Place 1 drop into both eyes at bedtime.     canagliflozin 100 MG Tabs tablet  Commonly known as:  INVOKANA   Take 1 tablet (100 mg total) by mouth daily.     ibuprofen 600 MG tablet  Commonly known as:  ADVIL,MOTRIN  Take 1 tablet (600 mg total) by mouth every 8 (eight) hours as needed.     insulin glargine 100 UNIT/ML injection  Commonly known as:  LANTUS  Inject 0.6 mLs (60 Units total) into the skin daily.     metoprolol succinate 25 MG 24 hr tablet  Commonly known as:  TOPROL XL  Take 1 tablet (25 mg total) by mouth daily.     pantoprazole 40 MG tablet  Commonly known as:  PROTONIX  Take 1 tablet (40 mg total) by mouth daily.     quinapril-hydrochlorothiazide 20-25 MG tablet  Commonly known as:  ACCURETIC  Take 1 tablet by mouth daily.     sertraline 50 MG tablet  Commonly known as:  ZOLOFT  Take 1 tablet (50 mg total) by mouth daily.     zoster vaccine live (PF) 19400 UNT/0.65ML injection  Commonly known as:  ZOSTAVAX  Inject 19,400 Units into the skin once.        Discharge Instructions: Please refer to Patient Instructions section of EMR for full details.  Patient was counseled important signs and symptoms that should prompt return to medical care, changes in medications, dietary instructions, activity restrictions, and follow up appointments.   Follow-Up Appointments: Follow-up Information    Follow up with Phill Myron, MD. Go on 11/29/2015.   Specialty:  Family Medicine   Why:  1:45 pm appointment for hospital follow-up   Contact information:   [place] [address] [phone]       Follow up with Danice Goltz, MD. Go on 12/20/2015.   Specialty:  Ophthalmology   Why:  12:50 pm appointment with ophthalmology to follow-up double vision   Contact information:   [place] [id number] [phone]       Rogue Bussing, MD 11/24/2015, 1:37 PM PGY-1, [place]

## 2015-11-22 NOTE — Progress Notes (Signed)
Reviewed discharge paperwork with pt and family.  PIV removed.  Pt denied any other needs at this time.  Pt taken to discharge location via wheelchair.

## 2015-11-22 NOTE — Progress Notes (Signed)
Family Medicine Teaching Service Daily Progress Note Intern Pager: [phone]  Patient name: Anna [name] Medical record number: [id number] Date of birth: Mar 18, 1946 Age: 70 y.o. Gender: female  Primary Care Provider: Phill Myron, MD Consultants: Neurology  Code Status: Full   Pt Overview and Major Events to Date:   Assessment and Plan: Anna [name] is a 70 y.o. female presenting with diplopia. PMH is significant for HTN, HLD, Glaucoma, T2DM, CKD 3, GERD  Diplopia: Worsened double vision with looking to the left. Neurologic exam intact. Patient has hx of chronic glaucoma and has not been taking her eye drops. Unlikely acute angle glaucoma as patient vision is not decreased, nor does patient have eye pain on admission. Initial differential included myasthenia gravis vs. acute infarct vs possible opthamopelgia. However, CT head and MRA/MRI brain negative for any acute infarcts. 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 - Lipid panel ASCVD risk 21%, increase Lipitor to 40 mg  - HA1C 10.1, compared to 7.9 in September; TSH wnl  - Will make outpatient ophthalmology follow-up for evaluation of any continued diplopia after discharge - ACTH antibodies pending to work up Myasthenia gravis    HTN: As high as 166/86 but ranged from 114-141/71-86 overnight - continue amlodipine 5 mg, metoprolol 25 mg, and lisinopril + hctz as substitute for home accuretic  - Continue home statin and aspirin   T2DM: home Lantus 60 units, Invokana. CBGs 200s since admissions  - Lantus 30 units and moderate SSI  - CBGs ACQHS   Depression: Home zoloft  - continue   Glaucoma: - Continue latanoprost  FEN/GI: PPI Prophylaxis: Lovenox   Disposition: Anticipate discharge today   Subjective:  Patient doing well this morning, still with some diplopia. No eye pain. No focal weakness, no  changes sensations.   Objective: Temp:  [97.6 F (36.4 C)-98.6 F (37 C)] 97.6 F (36.4 C) (03/02 1025) Pulse Rate:  [64-85] 73 (03/02 1025) Resp:  [16-18] 18 (03/02 1025) BP: (114-152)/(71-86) 125/76 mmHg (03/02 1025) SpO2:  [92 %-99 %] 99 % (03/02 1025) Physical Exam: General: Patient doing well, no NAD  Eyes: normal conjunctiva with minimal erythema, limited abduction of left eye that is intermittent Cardiovascular: RRR, no murmurs, no gallops  Respiratory: CTAB, no wheezes, rhonchi  Abdomen: BS+ no ttp, no rebound  Neuro: CNs intact except for CNVI, upper and lower extremity strength intact. Visual acuity 20/50 on left, 20/30 on right.   Laboratory:  Recent Labs Lab 11/20/15 2045 11/21/15 0535  WBC 7.1 5.8  HGB 12.6 11.9*  HCT 38.3 37.6  PLT 267 240    Recent Labs Lab 11/20/15 2045 11/21/15 0535  NA 143 141  K 3.9 3.8  CL 105 104  CO2 28 23  BUN 15 14  CREATININE 1.07* 1.01*  CALCIUM 10.2 10.1  PROT 7.3  --   BILITOT 0.5  --   ALKPHOS 97  --   ALT 18  --   AST 19  --   GLUCOSE 189* 292*    Imaging/Diagnostic Tests: Ct Head Wo Contrast  11/20/2015  CLINICAL DATA:  PT WOKE UP THIS AM WITH BLURRED VISION IN LEFT EYE, ALMOST DOUBLE VISIONALSO HAS PRESSURE BEHIND THAT EYE AND BLOODY VESSELSPT HAS H/O HBP, CATARACTS AND GLAUCOMA EXAM: CT HEAD WITHOUT CONTRAST TECHNIQUE: Contiguous axial images were obtained from the base of the skull through the vertex without intravenous contrast. COMPARISON:  10/21/2014 FINDINGS:  The ventricles are normal in size and configuration. There are no parenchymal masses or mass effect, no evidence of an infarct, no extra-axial masses or abnormal fluid collections and no intracranial hemorrhage. Normal globes and orbits. Sinuses and mastoid air cells are clear. IMPRESSION: Normal unenhanced CT scan of the brain. Electronically Signed   By: Lajean Manes M.D.   On: 11/20/2015 20:28   Mr Brain Wo Contrast  11/21/2015  CLINICAL DATA:  Acute  onset of visual disturbance.  Double vision. EXAM: MRI HEAD WITHOUT CONTRAST MRA HEAD WITHOUT CONTRAST TECHNIQUE: Multiplanar, multiecho pulse sequences of the brain and surrounding structures were obtained without intravenous contrast. Angiographic images of the head were obtained using MRA technique without contrast. COMPARISON:  Head CT 11/20/2015.  MRI 11/03/2014. FINDINGS: MRI HEAD FINDINGS Diffusion imaging does not show any acute or subacute infarction. The brainstem and cerebellum are normal. Cerebral hemispheres are normal except for a few small foci of T2 and FLAIR signal in the white matter not likely to be of any clinical significance. No cortical or large vessel territory insult. No mass lesion, hemorrhage, hydrocephalus or extra-axial collection. No pituitary mass. Arachnoid herniation into the sella, likely incidental. MRA HEAD FINDINGS Both internal carotid arteries are widely patent into the brain. The anterior and middle cerebral vessels are normal without proximal stenosis, aneurysm or vascular malformation. Large posterior communicating arteries bilaterally. Both vertebral arteries are patent to the basilar. No basilar stenosis. Posterior circulation branch vessels are normal. Posterior circulation vessels are relatively diminutive because of the prominent anterior circulation contribution to the PCAs. IMPRESSION: MRI head: No acute or significant finding. Normal except for a few tiny white matter foci not of any clinical relevance. MRA head: Normal evaluation of the large and medium size vessels. No stenosis or occlusion. Electronically Signed   By: Nelson Chimes M.D.   On: 11/21/2015 09:10   Mr Jodene Nam Head/brain Wo Cm  11/21/2015  CLINICAL DATA:  Acute onset of visual disturbance.  Double vision. EXAM: MRI HEAD WITHOUT CONTRAST MRA HEAD WITHOUT CONTRAST TECHNIQUE: Multiplanar, multiecho pulse sequences of the brain and surrounding structures were obtained without intravenous contrast.  Angiographic images of the head were obtained using MRA technique without contrast. COMPARISON:  Head CT 11/20/2015.  MRI 11/03/2014. FINDINGS: MRI HEAD FINDINGS Diffusion imaging does not show any acute or subacute infarction. The brainstem and cerebellum are normal. Cerebral hemispheres are normal except for a few small foci of T2 and FLAIR signal in the white matter not likely to be of any clinical significance. No cortical or large vessel territory insult. No mass lesion, hemorrhage, hydrocephalus or extra-axial collection. No pituitary mass. Arachnoid herniation into the sella, likely incidental. MRA HEAD FINDINGS Both internal carotid arteries are widely patent into the brain. The anterior and middle cerebral vessels are normal without proximal stenosis, aneurysm or vascular malformation. Large posterior communicating arteries bilaterally. Both vertebral arteries are patent to the basilar. No basilar stenosis. Posterior circulation branch vessels are normal. Posterior circulation vessels are relatively diminutive because of the prominent anterior circulation contribution to the PCAs. IMPRESSION: MRI head: No acute or significant finding. Normal except for a few tiny white matter foci not of any clinical relevance. MRA head: Normal evaluation of the large and medium size vessels. No stenosis or occlusion. Electronically Signed   By: Nelson Chimes M.D.   On: 11/21/2015 09:10     Tatyana Biber Corinda Gubler, MD 11/22/2015, 10:30 AM PGY-1, [place] Intern pager: [phone], text pages welcome

## 2015-11-26 LAB — ACETYLCHOLINE RECEPTOR AB, ALL: Acetylchol Block Ab: 16 % (ref 0–25)

## 2015-11-29 ENCOUNTER — Encounter: Payer: Self-pay | Admitting: Family Medicine

## 2015-11-29 ENCOUNTER — Ambulatory Visit (INDEPENDENT_AMBULATORY_CARE_PROVIDER_SITE_OTHER): Payer: Medicare Other | Admitting: Family Medicine

## 2015-11-29 VITALS — BP 113/68 | HR 72 | Temp 98.4°F | Wt 165.0 lb

## 2015-11-29 DIAGNOSIS — E1149 Type 2 diabetes mellitus with other diabetic neurological complication: Secondary | ICD-10-CM | POA: Diagnosis not present

## 2015-11-29 DIAGNOSIS — Z794 Long term (current) use of insulin: Secondary | ICD-10-CM

## 2015-11-29 DIAGNOSIS — E119 Type 2 diabetes mellitus without complications: Secondary | ICD-10-CM

## 2015-11-29 DIAGNOSIS — G53 Cranial nerve disorders in diseases classified elsewhere: Secondary | ICD-10-CM

## 2015-11-29 MED ORDER — ONETOUCH ULTRASOFT LANCETS MISC: Status: DC

## 2015-11-29 MED ORDER — GLUCOSE BLOOD VI STRP: ORAL_STRIP | Status: DC

## 2015-11-29 MED ORDER — ONETOUCH ULTRA 2 W/DEVICE KIT: 1.0000 | PACK | Freq: Once | Status: AC

## 2015-11-29 NOTE — Progress Notes (Signed)
   Subjective:    Patient ID: Anna [name], female    DOB: [birth year], 70 y.o.   MRN: [id number]  Seen for hospital follow-up for   CC: Diplopia  She continues to report double vision is worse with looking left.  Otherwise denies difficulties with speech or unilateral numbness, weakness.  She has been working on eating less sweets.  Reports blood sugars are running 80-120.   Review of Systems   See HPI for ROS. Objective:  BP 113/68 mmHg  Pulse 72  Temp(Src) 98.4 F (36.9 C) (Oral)  Wt 165 lb (74.844 kg)  General: NAD Cardiac: RRR, normal heart sounds, no murmurs Respiratory: CTAB, normal effort Neuro: alert and oriented, Left eye is esotropia with partial abducens palsy otherwise CN 3-12 intact; Gross strength and sensation intact.  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  - Recommend f/u with ophthalmology as scheduled - Working on better blood glucose control

## 2015-11-29 NOTE — Assessment & Plan Note (Signed)
She is working on diet and has cut out sugary beverages and sweets - Given a prescription for One Touch meter and supplies - Advised blood sugars every morning and 2 hour postprandial - Follow-up in 2-3 weeks for reassessment - Continue Lantus 60 units every morning and Invokana 100 daily

## 2015-11-29 NOTE — Assessment & Plan Note (Signed)
Slowly improving.  - Recommend f/u with ophthalmology as scheduled - Working on better blood glucose control

## 2015-12-07 ENCOUNTER — Ambulatory Visit: Payer: Commercial Managed Care - HMO

## 2015-12-13 ENCOUNTER — Ambulatory Visit (INDEPENDENT_AMBULATORY_CARE_PROVIDER_SITE_OTHER): Payer: Medicare Other | Admitting: Family Medicine

## 2015-12-13 ENCOUNTER — Encounter: Payer: Self-pay | Admitting: Family Medicine

## 2015-12-13 VITALS — BP 116/59 | HR 66 | Temp 98.1°F | Ht 61.0 in | Wt 163.8 lb

## 2015-12-13 DIAGNOSIS — E1140 Type 2 diabetes mellitus with diabetic neuropathy, unspecified: Secondary | ICD-10-CM

## 2015-12-13 DIAGNOSIS — Z794 Long term (current) use of insulin: Secondary | ICD-10-CM

## 2015-12-13 NOTE — Assessment & Plan Note (Signed)
Blood sugars under much better control with dietary improvements.  One episode of hypoglycemia.  Congratulated patients on blood sugar improvement and encouraged continued dietary improvements.  - Continue Lantus 60 units every morning; advised patient to call if has more frequent low blood sugars and will consider decreasing insulin - Continue Invokana daily - We will do foot exam in next visit; patient declined today - Has scheduled follow-up with ophthalmology on 12/20/15; previous cataracts and glaucoma with new 6 cranial nerve palsy in the past month - Follow-up June for A1c recheck; advised her to make a sooner appointment if blood sugars increased to greater than 200

## 2015-12-13 NOTE — Progress Notes (Signed)
  Patient name: Anna [name] MRN [id number]  Date of birth: 1945-11-11  CC & HPI:  Anna [name] is a 70 y.o. female presenting today for diabetes.   DIABETES   Blood Sugar Ranges: 61-150 fasting; 100-200 2 hr postprandial  Symptoms of Hypoglycemia? Yes - once (61)  Comorbid Symptoms: no Chest pain; no SOB; yes - Neuropathy in bilaterally hands: yes - Vision problems; recent hospitalization for 6 cranial nerve palsy  Medication Compliance: yes   Medication Side Effects: yes - one episode of hypoglycemia  Counseling  Diet pattern: Reduce sugary beverage consumption, continues to drink 3-4 cups of the 8 juice daily  Smoking History Noted  Objective Findings:  Vitals: BP 116/59 mmHg  Pulse 66  Temp(Src) 98.1 F (36.7 C) (Oral)  Ht 5\' 1"  (1.549 m)  Wt 163 lb 12.8 oz (74.299 kg)  BMI 30.97 kg/m2  Gen: NAD CV: RRR w/o m/r/g, pulses +2 b/l Resp: CTAB w/ normal respiratory effort  Assessment & Plan:   Diabetes mellitus with diabetic neuropathy, with long-term current use of insulin (HCC) Blood sugars under much better control with dietary improvements.  One episode of hypoglycemia.  Congratulated patients on blood sugar improvement and encouraged continued dietary improvements.  - Continue Lantus 60 units every morning; advised patient to call if has more frequent low blood sugars and will consider decreasing insulin - Continue Invokana daily - We will do foot exam in next visit; patient declined today - Has scheduled follow-up with ophthalmology on 12/20/15; previous cataracts and glaucoma with new 6 cranial nerve palsy in the past month - Follow-up June for A1c recheck; advised her to make a sooner appointment if blood sugars increased to greater than 200

## 2015-12-15 ENCOUNTER — Other Ambulatory Visit: Payer: Self-pay | Admitting: Family Medicine

## 2015-12-18 ENCOUNTER — Other Ambulatory Visit: Payer: Self-pay | Admitting: Family Medicine

## 2015-12-18 ENCOUNTER — Telehealth: Payer: Self-pay | Admitting: Family Medicine

## 2015-12-18 DIAGNOSIS — H409 Unspecified glaucoma: Secondary | ICD-10-CM

## 2015-12-18 DIAGNOSIS — H269 Unspecified cataract: Secondary | ICD-10-CM

## 2015-12-18 NOTE — Telephone Encounter (Signed)
Need referral to [facility].  Patient was told to have a referral sent to them to see Dr. Tama High.  Their fax# is [phone].  Have an appt with them on Thursday afternoon 12:50

## 2015-12-18 NOTE — Telephone Encounter (Signed)
Will forward to MD to place referral. Maddelynn Moosman,CMA  

## 2015-12-30 IMAGING — CT CT HEAD W/O CM
1 of 2 series · 16 of 30 positions shown, 20 images · non-contrast
Comparison: None.

CLINICAL DATA: Acute onset of confusion and slurred speech

EXAM:
CT HEAD WITHOUT CONTRAST
TECHNIQUE: Contiguous axial images were obtained from the base of the skull
through the vertex without intravenous contrast.

[Series 3: head 2.0 h70h · axial · 0.43mm/px · z∈[+1015,+1139]mm · 16 of 70 slices shown, 20 images]
[im 4/70  brain]
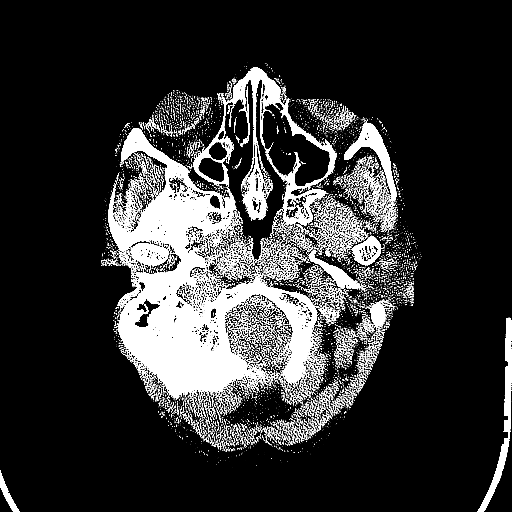
[im 4/70  bone]
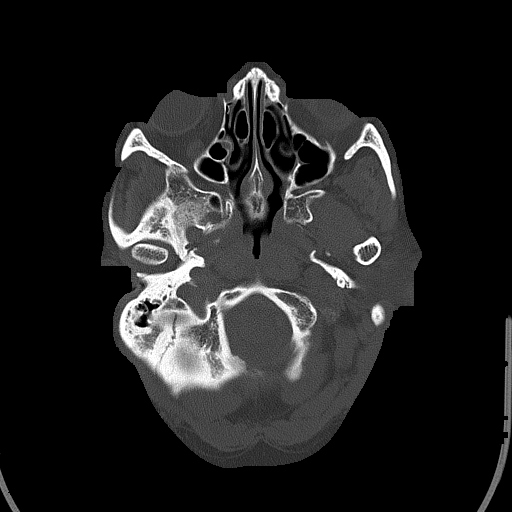
[im 7/70  brain]
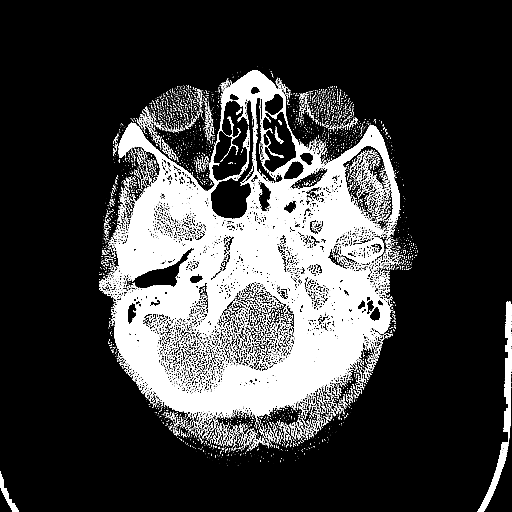
[im 11/70  brain]
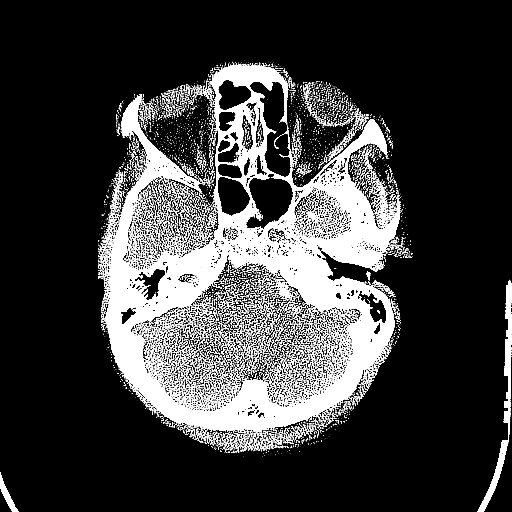
[im 18/70  brain]
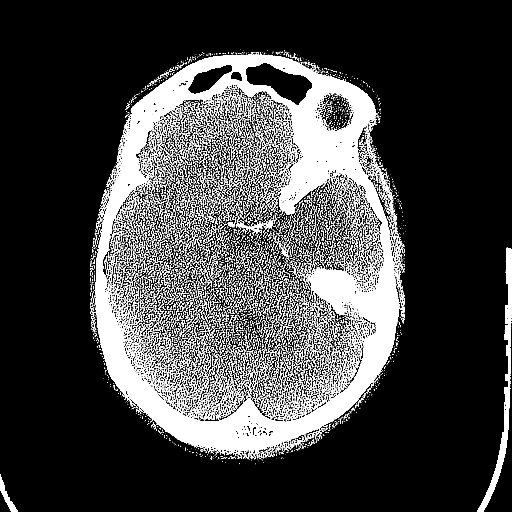
[im 21/70  brain]
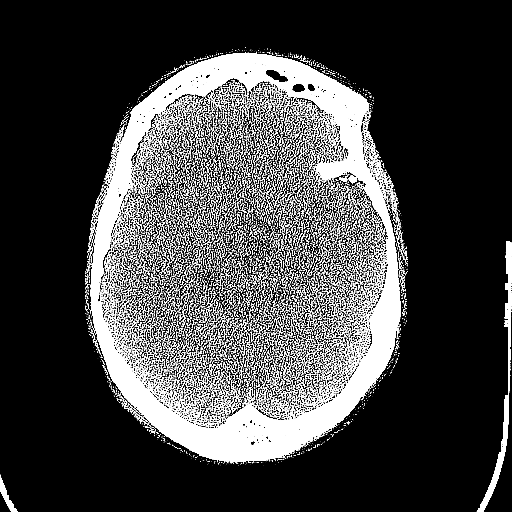
[im 21/70  bone]
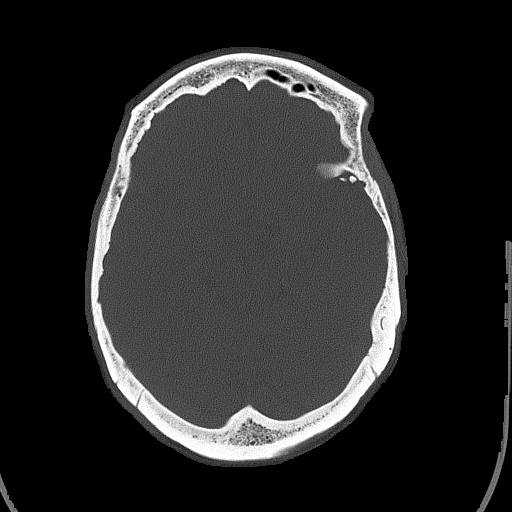
[im 25/70  brain]
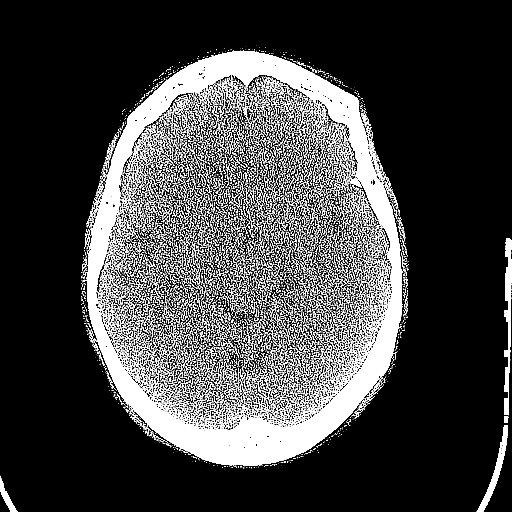
[im 28/70  brain]
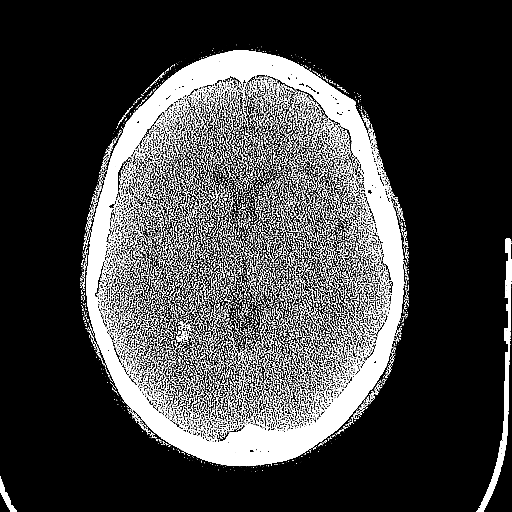
[im 32/70  brain]
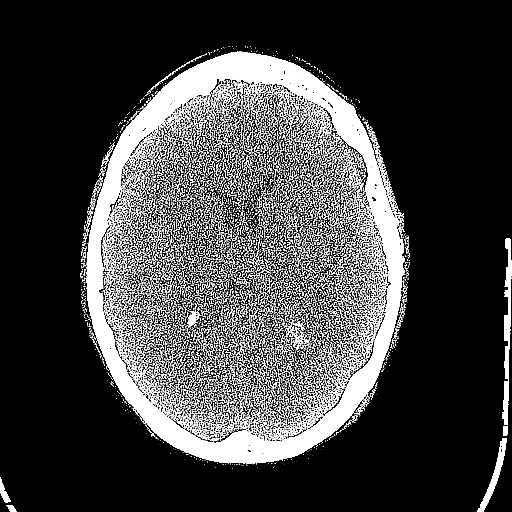
[im 38/70  brain]
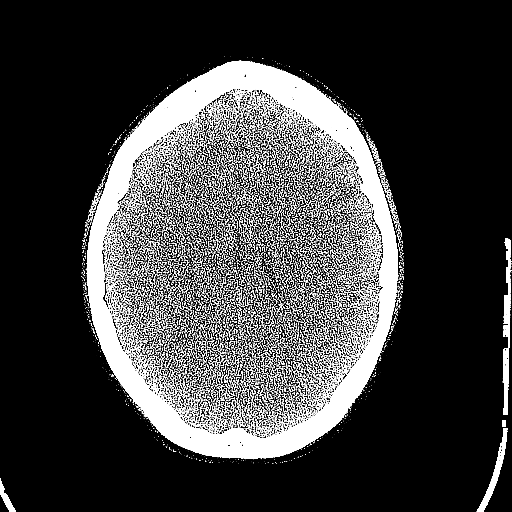
[im 38/70  bone]
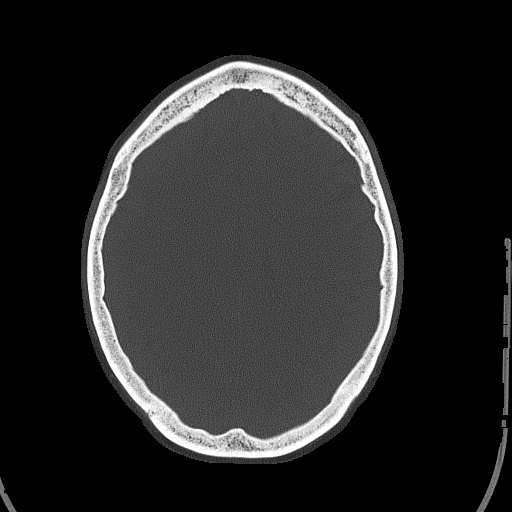
[im 42/70  brain]
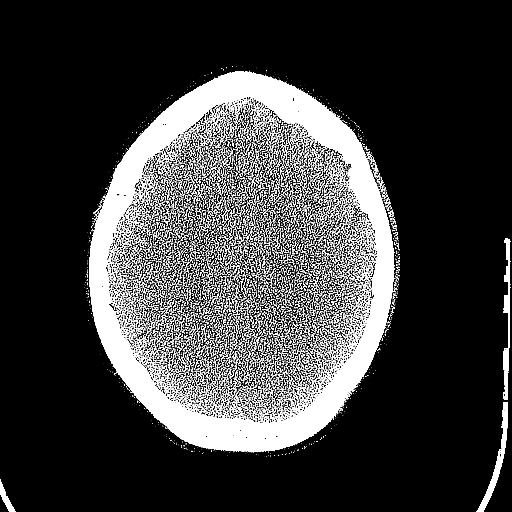
[im 45/70  brain]
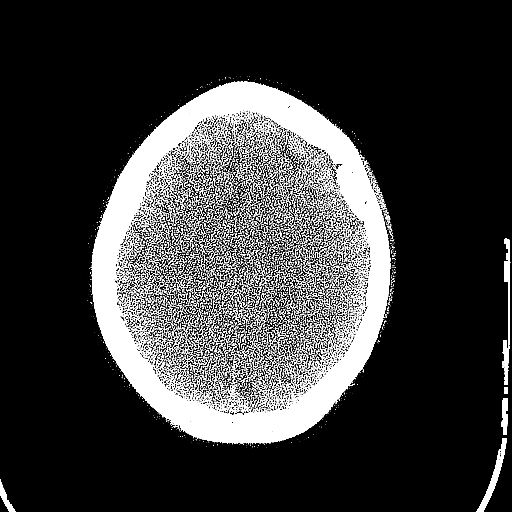
[im 49/70  brain]
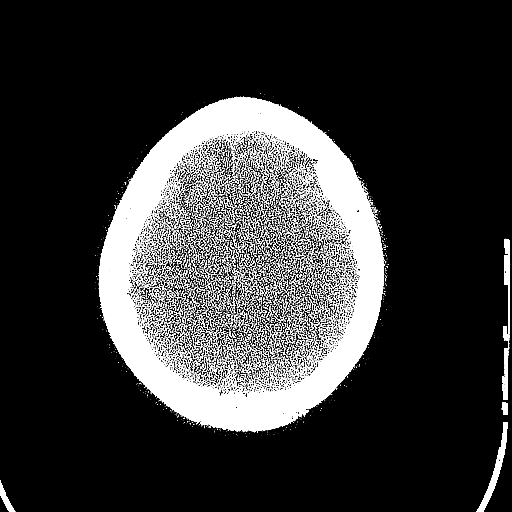
[im 52/70  brain]
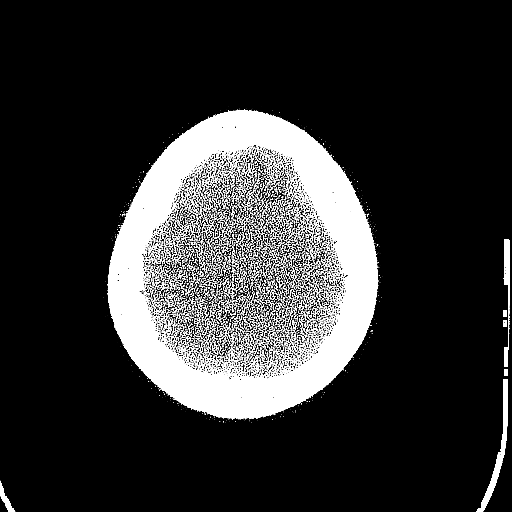
[im 52/70  bone]
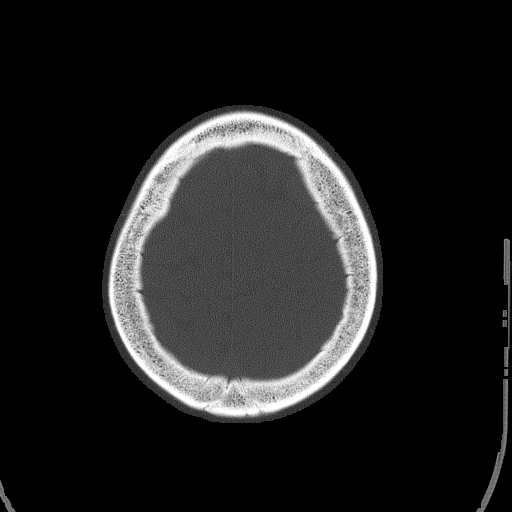
[im 59/70  brain]
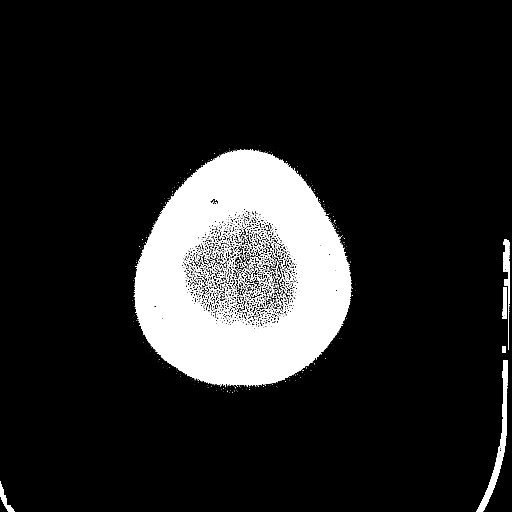
[im 63/70  brain]
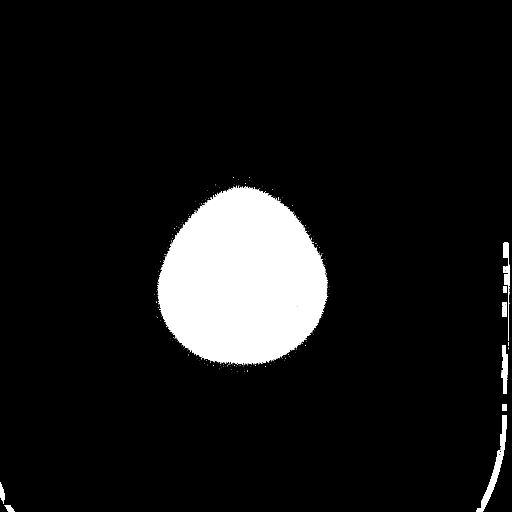
[im 66/70  brain]
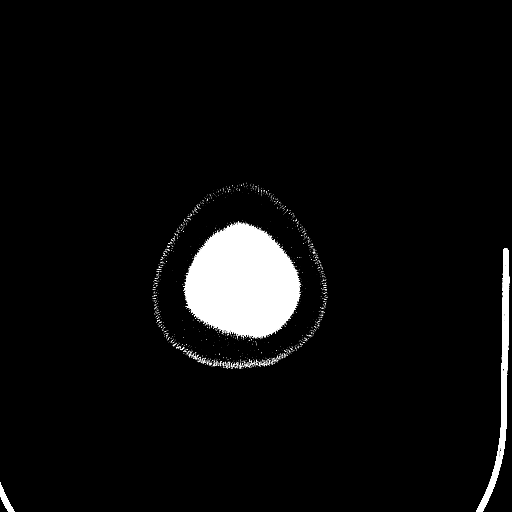

[16 of 30 positions shown; findings below may reference images not displayed]

FINDINGS: The ventricles are normal in size and configuration. There is no
appreciable mass, hemorrhage, extra-axial fluid collection, or
midline shift. Gray-white compartments are normal. No acute infarct
apparent. The bony calvarium appears intact. The mastoid air cells
are clear.
IMPRESSION: No intracranial mass, hemorrhage, or focal gray -white compartment
lesions/acute appearing infarct.

## 2016-02-04 ENCOUNTER — Other Ambulatory Visit: Payer: Self-pay | Admitting: Family Medicine

## 2016-02-05 NOTE — Telephone Encounter (Signed)
Pt has an appt on 02/13/16. Jazmin Hartsell,CMA

## 2016-02-13 ENCOUNTER — Encounter: Payer: Self-pay | Admitting: Family Medicine

## 2016-02-13 ENCOUNTER — Ambulatory Visit (INDEPENDENT_AMBULATORY_CARE_PROVIDER_SITE_OTHER): Payer: Medicare Other | Admitting: Family Medicine

## 2016-02-13 VITALS — BP 98/63 | HR 71 | Temp 98.4°F | Ht 61.0 in | Wt 167.4 lb

## 2016-02-13 DIAGNOSIS — Z794 Long term (current) use of insulin: Secondary | ICD-10-CM | POA: Diagnosis not present

## 2016-02-13 DIAGNOSIS — E1140 Type 2 diabetes mellitus with diabetic neuropathy, unspecified: Secondary | ICD-10-CM | POA: Diagnosis not present

## 2016-02-13 LAB — POCT GLYCOSYLATED HEMOGLOBIN (HGB A1C): Hemoglobin A1C: 7.8

## 2016-02-13 NOTE — Progress Notes (Signed)
  Patient name: Anna [name] MRN [id number]  Date of birth: 04/04/1946  CC & HPI:  Anna [name] is a 70 y.o. female presenting today for diabetes. - Presenting blood sugar log: Most numbers under 250 with occasional 300s.  Two blood sugars under 70, but she denies symptoms with these - Cranial nerve palsy has resolved - She continues to follow-up with ophthalmology for glaucoma and cataracts - Endorses eating mainly one large meal a day that is carbohydrate heavy - Has significantly cut back on sweets  Smoking History Noted  Objective Findings:  Vitals: BP 98/63 mmHg  Pulse 71  Temp(Src) 98.4 F (36.9 C) (Oral)  Ht 5\' 1"  (1.549 m)  Wt 167 lb 6.4 oz (75.932 kg)  BMI 31.65 kg/m2  SpO2 98%  Gen: NAD CV: RRR w/o m/r/g, pulses +2 b/l Resp: CTAB w/ normal respiratory effort  Assessment & Plan:   Diabetes mellitus with diabetic neuropathy, with long-term current use of insulin (HCC) Blood sugars improving but still not at goal.  Congratulated  her on reduction her A1c - Refer to Dr. Valentina Lucks for Diabetes; consideration of GLP-1 versus mealtime insulin with her largest meal.  - Continue Lantus 60 units every morning - Continue Invokana  - Long discussion about diet and attempting to eat smaller more frequent meals as well as reduce carbohydrate intake - She will check blood sugars every morning and 2 hour postprandial (largest meal) and take to apt with Dr Valentina Lucks in 1-2 weeks

## 2016-02-13 NOTE — Assessment & Plan Note (Signed)
Blood sugars improving but still not at goal.  Congratulated  her on reduction her A1c - Refer to Dr. Valentina Lucks for Diabetes; consideration of GLP-1 versus mealtime insulin with her largest meal.  - Continue Lantus 60 units every morning - Continue Invokana  - Long discussion about diet and attempting to eat smaller more frequent meals as well as reduce carbohydrate intake - She will check blood sugars every morning and 2 hour postprandial (largest meal) and take to apt with Dr Valentina Lucks in 1-2 weeks

## 2016-02-13 NOTE — Patient Instructions (Signed)
It was great seeing you today.  Great job on getting your blood sugars under better control.   1. Check your blood sugar every morning prior to eating or taking medications 2. Check your blood sugar 2 hours after your biggest meal (dinner) 3. Follow-up with Dr. Valentina Lucks in 1-2 weeks to discuss starting mealtime insulin or GLP-1   Please bring all your medications to every doctors visit  Sign up for My Chart to have easy access to your labs results, and communication with your Primary care physician.  Next Appointment  Please call to make an appointment with Dr Berkley Harvey in 3-4 weeks for diabetes   I look forward to talking with you again at our next visit. If you have any questions or concerns before then, please call the clinic at [phone].  Take Care,   Dr Phill Myron

## 2016-02-26 ENCOUNTER — Ambulatory Visit
Admission: RE | Admit: 2016-02-26 | Discharge: 2016-02-26 | Disposition: A | Discharge status: Home / Self Care | Payer: Medicare Other | Source: Ambulatory Visit

## 2016-02-26 DIAGNOSIS — Z1231 Encounter for screening mammogram for malignant neoplasm of breast: Secondary | ICD-10-CM

## 2016-04-11 ENCOUNTER — Other Ambulatory Visit: Payer: Self-pay | Admitting: Internal Medicine

## 2016-06-17 ENCOUNTER — Encounter: Payer: Self-pay | Admitting: Family Medicine

## 2016-06-17 ENCOUNTER — Ambulatory Visit (INDEPENDENT_AMBULATORY_CARE_PROVIDER_SITE_OTHER): Payer: Medicaid Other | Admitting: Family Medicine

## 2016-06-17 VITALS — BP 170/64 | HR 70 | Temp 98.2°F | Wt 164.0 lb

## 2016-06-17 DIAGNOSIS — B070 Plantar wart: Secondary | ICD-10-CM | POA: Diagnosis not present

## 2016-06-17 DIAGNOSIS — E1140 Type 2 diabetes mellitus with diabetic neuropathy, unspecified: Secondary | ICD-10-CM | POA: Diagnosis present

## 2016-06-17 DIAGNOSIS — Z111 Encounter for screening for respiratory tuberculosis: Secondary | ICD-10-CM

## 2016-06-17 DIAGNOSIS — Z794 Long term (current) use of insulin: Secondary | ICD-10-CM | POA: Diagnosis not present

## 2016-06-17 DIAGNOSIS — Z23 Encounter for immunization: Secondary | ICD-10-CM

## 2016-06-17 LAB — POCT GLYCOSYLATED HEMOGLOBIN (HGB A1C): Hemoglobin A1C: 7.7

## 2016-06-17 NOTE — Assessment & Plan Note (Signed)
Patient has a single plantar wart measuring about 1cm in diameter on the medial aspect of her left foot with a central area of induration. Patient has been bothered by it for many months and have been picking at it. The liquid nitrogen sprayer was used for two cycles instead of the usual 3 cycles since patient is a diabetic with a history of neuropathy. --Patient will be started on OTC Trans-ver-sal patch (salicylic patch) for the next two months and we will follow up in clinic in three months at her next visit.

## 2016-06-17 NOTE — Progress Notes (Signed)
   Subjective:    Patient ID: Anna [name], female    DOB: 06-Sep-1946, 70 y.o.   MRN: [id number]   CC: Follow up for DM type II and A1c check   HPI: Patient is a 70 yo old female with a past medical history significant for HTN, DMII,  TIA, and CKDIII who presented today to clinic for her routine A1c check and establish care with PCP. Patient has been doing well with no major concerns. Cranial nerve VI palsy has completely resolved and she has been following with ophtalmology. Patient denies any nausea, abdominal pain, chest pain, shortness of breath, vision troubles or dizziness. 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   Smoking status reviewed   ROS: all other systems were reviewed and are negative other than in the HPI   Past medical history, surgical, family, and social history reviewed and updated in the EMR as appropriate.  Objective:  BP (!) 170/64 (BP Location: Left Arm, Patient Position: Sitting, Cuff Size: Normal)   Pulse 70   Temp 98.2 F (36.8 C) (Oral)   Wt 164 lb (74.4 kg)   BMI 30.99 kg/m   Vitals and nursing note reviewed  General: NAD, pleasant, able to participate in exam Cardiac: RRR, normal heart sounds, no murmurs. 2+ radial and PT pulses bilaterally Respiratory: CTAB, normal effort, No wheezes, rales or rhonchi Abdomen: soft, nontender, nondistended, no hepatic or splenomegaly, +BS Extremities: no edema or cyanosis. WWP. 1 cm plantar wart on the lateral aspect of left foot with central induration. Skin: warm and dry, no rashes noted Neuro: alert and oriented x4, no focal deficits Psych: Normal affect and mood   Assessment & Plan:    Diabetes mellitus with diabetic neuropathy, with long-term current use of insulin (HCC) Hemoglobin A1c was 7.7 down from 7.8 on 01/2016. Foot exam was performed and patient has good sensation on 10 point diabetic foot exam bilaterally. Endorse minimal numbness and tingling in her  lower extremity. Patient was  advis ed to continue on current diet trend in addition to treatment plan. --Continue current home regimen --Will follow up in clinic three months  Plantar warts Patient has a single plantar wart measuring about 1cm in diameter on the medial aspect of her left foot with a central area of induration. Patient has been bothered by it for many months and have been picking at it. The liquid nitrogen sprayer was used for two cycles instead of the usual 3 cycles since patient is a diabetic with a history of neuropathy. --Patient will be started on OTC Trans-ver-sal patch (salicylic patch) for the next two months and we will follow up in clinic in three months at her next visit.  Patient received influenza immunization today as well as a PPD placed for employment purpose. Patient will return in three days for a read.  Marjie Skiff, MD Family Medicine Resident PGY-1

## 2016-06-17 NOTE — Assessment & Plan Note (Addendum)
Hemoglobin A1c was 7.7 down from 7.8 on 01/2016. Foot exam was performed and patient has good sensation on 10 point diabetic foot exam bilaterally. Endorse minimal numbness and tingling in her lower extremity. Patient was  advis ed to continue on current diet trend in addition to treatment plan. --Continue current home regimen --Will follow up in clinic three months

## 2016-06-17 NOTE — Patient Instructions (Addendum)
It was great seeing you today! We have addressed the following issues today  1. We talked about your Hemoglobin A1c today which is at 7.7, the lowest it has been in while, continue to cut down on sweet and watch your diet. 2. Use Trans-Ver-sal for your warts. You can get it over the counter. To be use everyday for about two months. 3. Will see you in clinic in 3 months   If we did any lab work today, and the results require attention, either me or my nurse will get in touch with you. If everything is normal, you will get a letter in mail. If you don't hear from [name] in two weeks, please give [name] a call. Otherwise, I look forward to talking with you again at our next visit. If you have any questions or concerns before then, please call the clinic at [phone].  Please bring all your medications to every doctors visit   Sign up for My Chart to have easy access to your labs results, and communication with your Primary care physician.    Please check-out at the front desk before leaving the clinic.   Take Care,

## 2016-06-19 ENCOUNTER — Encounter: Payer: Self-pay | Admitting: *Deleted

## 2016-06-19 ENCOUNTER — Ambulatory Visit (INDEPENDENT_AMBULATORY_CARE_PROVIDER_SITE_OTHER): Payer: Medicaid Other | Admitting: *Deleted

## 2016-06-19 DIAGNOSIS — Z111 Encounter for screening for respiratory tuberculosis: Secondary | ICD-10-CM

## 2016-06-19 DIAGNOSIS — Z7689 Persons encountering health services in other specified circumstances: Secondary | ICD-10-CM

## 2016-06-19 LAB — TB SKIN TEST
Induration: 0 mm
TB Skin Test: NEGATIVE

## 2016-06-19 NOTE — Progress Notes (Signed)
   PPD Reading Note PPD read and results entered in [facility]. Result: 0 mm induration. Interpretation: Negative If test not read within 48-72 hours of initial placement, patient advised to repeat in other arm 1-3 weeks after this test. Allergic reaction: no  Derl Barrow, RN

## 2016-07-09 ENCOUNTER — Emergency Department (HOSPITAL_COMMUNITY)
Admission: EM | Admit: 2016-07-09 | Discharge: 2016-07-09 | Disposition: A | Discharge status: Home / Self Care | Payer: Medicare Other | Attending: Emergency Medicine | Admitting: Emergency Medicine

## 2016-07-09 ENCOUNTER — Encounter (HOSPITAL_COMMUNITY): Payer: Self-pay

## 2016-07-09 DIAGNOSIS — Z79899 Other long term (current) drug therapy: Secondary | ICD-10-CM | POA: Diagnosis not present

## 2016-07-09 DIAGNOSIS — Z87891 Personal history of nicotine dependence: Secondary | ICD-10-CM | POA: Diagnosis not present

## 2016-07-09 DIAGNOSIS — M545 Low back pain: Secondary | ICD-10-CM | POA: Diagnosis present

## 2016-07-09 DIAGNOSIS — I129 Hypertensive chronic kidney disease with stage 1 through stage 4 chronic kidney disease, or unspecified chronic kidney disease: Secondary | ICD-10-CM | POA: Diagnosis not present

## 2016-07-09 DIAGNOSIS — N183 Chronic kidney disease, stage 3 (moderate): Secondary | ICD-10-CM | POA: Insufficient documentation

## 2016-07-09 DIAGNOSIS — Z7982 Long term (current) use of aspirin: Secondary | ICD-10-CM | POA: Insufficient documentation

## 2016-07-09 DIAGNOSIS — E1122 Type 2 diabetes mellitus with diabetic chronic kidney disease: Secondary | ICD-10-CM | POA: Diagnosis not present

## 2016-07-09 DIAGNOSIS — M5416 Radiculopathy, lumbar region: Secondary | ICD-10-CM | POA: Diagnosis not present

## 2016-07-09 DIAGNOSIS — M5410 Radiculopathy, site unspecified: Secondary | ICD-10-CM

## 2016-07-09 MED ORDER — PREDNISONE 20 MG PO TABS
60.0000 mg | ORAL_TABLET | Freq: Once | ORAL | Status: AC
  Administered 2016-07-09: 60 mg via ORAL
  Filled 2016-07-09: qty 3

## 2016-07-09 MED ORDER — HYDROCODONE-ACETAMINOPHEN 5-325 MG PO TABS: 1.0000 | ORAL_TABLET | Freq: Four times a day (QID) | ORAL | 0 refills | Status: DC | PRN

## 2016-07-09 MED ORDER — HYDROCODONE-ACETAMINOPHEN 5-325 MG PO TABS
1.0000 | ORAL_TABLET | Freq: Once | ORAL | Status: AC
  Administered 2016-07-09: 1 via ORAL
  Filled 2016-07-09: qty 1

## 2016-07-09 MED ORDER — CYCLOBENZAPRINE HCL 10 MG PO TABS: 10.0000 mg | ORAL_TABLET | Freq: Two times a day (BID) | ORAL | 0 refills | Status: DC | PRN

## 2016-07-09 NOTE — ED Triage Notes (Signed)
Pt here with left hip down leg pain.  Denies fall.  History of same.  Pain started on Monday.  Pain worse with movement.  Denies urinary symptoms.

## 2016-07-09 NOTE — ED Provider Notes (Signed)
[facility]-EMERGENCY DEPT Provider Note   CSN: [id number] Arrival date & time: 07/09/16  1459  By signing my name below, I, Albert Thayil, attest that this documentation has been prepared under the direction and in the presence of non-physician practitioner,  , PA-C. Electronically Signed: Albert Thayil, Scribe. 07/09/2016. 4:26 PM.  History   Chief Complaint Chief Complaint  Patient presents with  . Hip Pain   The history is provided by the patient. No language interpreter was used.   HPI Comments: Anna [name] is a 70 y.o. female who presents to the Emergency Department complaining of constant moderate left lower back pain that started yesterday. She states that the pain radiates to her left hip. She describes the pain as a sharp/stabbing sensation exacerbated by movement and relieved by rest. She reports associated tingling. She states she has a hx of similar complaint, but the pain was less severe in the past. She denies any recent trauma, hx of IV drug use, numbness, bowel/bladder incontinence, hematuria, saddle paresthesia, or abdominal pain.     PCP: Abdoulaye Diallo, MD  Past Medical History:  Diagnosis Date  . Arthritis   . Blood transfusion without reported diagnosis    40 plus yrs ago  . Cataract   . CKD (chronic kidney disease), stage III 08/10/2014  . Diabetes mellitus   . GERD (gastroesophageal reflux disease)   . Glaucoma   . Hyperlipidemia   . Hypertension     Patient Active Problem List   Diagnosis Date Noted  . Plantar warts 06/17/2016  . Benign essential HTN   . Cranial nerve VI palsy   . 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 Diplopia 11/20/2015  . Abdominal pain, epigastric 03/02/2015  . TIA (transient ischemic attack) 10/25/2014  . CKD (chronic kidney disease), stage III 08/10/2014  . HLD (hyperlipidemia) 06/12/2014  . Right shoulder pain 06/12/2014  . Depression 12/03/2012  . History of tobacco use 06/09/2009    . VAGINITIS, ATROPHIC 10/25/2008  . COLONIC POLYPS 01/21/2007  . Diabetes mellitus with diabetic neuropathy, with long-term current use of insulin (HCC) 11/19/2006  . OBESITY, NOS 11/19/2006  . GLAUCOMA 11/19/2006  . CATARACT 11/19/2006  . HYPERTENSION, BENIGN SYSTEMIC 11/19/2006  . DIVERTICULOSIS OF COLON 11/19/2006  . PROTEINURIA 11/19/2006    Past Surgical History:  Procedure Laterality Date  . ABDOMINAL HYSTERECTOMY    . COLONOSCOPY    . POLYPECTOMY      OB History    No data available       Home Medications    Prior to Admission medications   Medication Sig Start Date End Date Taking? Authorizing Provider  amLODipine (NORVASC) 5 MG tablet Take 1 tablet (5 mg total) by mouth daily. 11/27/14 12/28/15  James R Joyner, MD  aspirin 81 MG tablet Take 1 tablet (81 mg total) by mouth daily. 11/27/14   James R Joyner, MD  atorvastatin (LIPITOR) 40 MG tablet TAKE ONE TABLET BY MOUTH ONCE DAILY 04/14/16   Abdoulaye Diallo, MD  bimatoprost (LUMIGAN) 0.01 % SOLN Place 1 drop into both eyes at bedtime. 11/27/14   James R Joyner, MD  Blood Glucose Monitoring Suppl (ONE TOUCH ULTRA 2) w/Device KIT 1 Device by Does not apply route once. 11/29/15   James R Joyner, MD  glucose blood (ONE TOUCH ULTRA TEST) test strip Check sugar 3-4 x daily as needed 11/29/15   James R Joyner, MD  ibuprofen (ADVIL,MOTRIN) 600 MG tablet Take 1 tablet (600 mg   total) by mouth every 8 (eight) hours as needed. Patient not taking: Reported on 11/29/2015 11/12/15   Marshall L Chambliss, MD  INVOKANA 100 MG TABS tablet TAKE ONE TABLET BY MOUTH ONCE DAILY 02/04/16   James R Joyner, MD  Lancets (ONETOUCH ULTRASOFT) lancets Check blood sugar 3-4 times daily as needed 11/29/15   James R Joyner, MD  LANTUS 100 UNIT/ML injection INJECT 60 UNITS SUBCUTANEOUSLY ONCE DAILY 12/18/15   James R Joyner, MD  metoprolol succinate (TOPROL XL) 25 MG 24 hr tablet Take 1 tablet (25 mg total) by mouth daily. 11/27/14 12/14/15  James R Joyner, MD   pantoprazole (PROTONIX) 40 MG tablet Take 1 tablet (40 mg total) by mouth daily. 06/06/15   James R Joyner, MD  quinapril-hydrochlorothiazide (ACCURETIC) 20-25 MG per tablet Take 1 tablet by mouth daily. 11/27/14   James R Joyner, MD  sertraline (ZOLOFT) 50 MG tablet Take 1 tablet (50 mg total) by mouth daily. 11/27/14   James R Joyner, MD    Family History Family History  Problem Relation Age of Onset  . Diabetes Daughter   . Hyperlipidemia Mother   . Diabetes Mother   . Cancer Brother   . Hyperlipidemia Brother   . Colon cancer Neg Hx   . Colon polyps Neg Hx   . Rectal cancer Neg Hx   . Stomach cancer Neg Hx   . Esophageal cancer Neg Hx     Social History Social History  Substance Use Topics  . Smoking status: Former Smoker    Years: 30.00    Types: Cigarettes    Quit date: 10/09/2014  . Smokeless tobacco: Never Used     Comment: 1 cig a week  . Alcohol use 0.6 oz/week    1 Cans of beer per week     Comment: occasional beer     Allergies   Metformin   Review of Systems Review of Systems  Gastrointestinal: Negative for abdominal pain.  Genitourinary: Negative for hematuria.  Musculoskeletal: Positive for back pain (Left lower back, radiating to left hip).  Neurological: Negative for numbness.     Physical Exam Updated Vital Signs BP 138/68 (BP Location: Right Arm)   Pulse 74   Temp 97.7 F (36.5 C) (Oral)   Resp 18   SpO2 99%   Physical Exam  Constitutional: She appears well-developed and well-nourished. No distress.  HENT:  Head: Normocephalic.  Eyes: Conjunctivae are normal.  Neck: Neck supple.  Cardiovascular: Normal rate and regular rhythm.   Pulmonary/Chest: Effort normal.  Abdominal: Soft.  Musculoskeletal: Normal range of motion.  TTP to the left para lumbar region without any overlying skin changes.   Neurological: She is alert.  DTRs are intact bilateral. Normal BLE strength. Dorsalis pedis pulses are palpable. Normal straight leg raise.    Skin: Skin is warm and dry.  Psychiatric: She has a normal mood and affect.  Nursing note and vitals reviewed.    ED Treatments / Results  DIAGNOSTIC STUDIES: Oxygen Saturation is 99% on RA, normal by my interpretation.    COORDINATION OF CARE: 4:30 PM- Pt advised of plan for treatment which includes pain medication. Instructed pt that due to symptoms and lack of known trauma, X-ray is not necessary. Pt agrees to treatment plan.  Labs (all labs ordered are listed, but only abnormal results are displayed) Labs Reviewed - No data to display  EKG  EKG Interpretation None       Radiology No results found.  Procedures Procedures (including   critical care time)  Medications Ordered in ED Medications - No data to display   Initial Impression / Assessment and Plan / ED Course  I have reviewed the triage vital signs and the nursing notes.  Pertinent labs & imaging results that were available during my care of the patient were reviewed by me and considered in my medical decision making (see chart for details).  Clinical Course    BP 138/68 (BP Location: Right Arm)   Pulse 74   Temp 97.7 F (36.5 C) (Oral)   Resp 18   SpO2 99%    No red flag s/s of low back pain. Patient was counseled on back pain precautions and told to do activity as tolerated but do not lift, push, or pull heavy objects more than 10 pounds for the next week. Patient counseled to use ice or heat on back for no longer than 15 minutes every hour.   Patient prescribed muscle relaxer and counseled on proper use of muscle relaxant medication.  Patient prescribed narcotic pain medicine and counseled on proper use of narcotic pain medications.  Counseled not to combine this medication with others containing tylenol.  Urged patient not to drink alcohol, drive, or perform any other activities that requires focus while taking either of these medications.   Patient urged to follow-up with PCP if pain does not  improve with treatment and rest or if pain becomes recurrent. Urged to return with worsening severe pain, loss of bowel or bladder control, trouble walking.  The patient verbalizes understanding and agrees with the plan.  In order to decrease risk of narcotic abuse. Pt's record were checked using the [place] Controlled Substance database.  Final Clinical Impressions(s) / ED Diagnoses   Final diagnoses:  Radicular low back pain    New Prescriptions New Prescriptions   CYCLOBENZAPRINE (FLEXERIL) 10 MG TABLET    Take 1 tablet (10 mg total) by mouth 2 (two) times daily as needed for muscle spasms.   HYDROCODONE-ACETAMINOPHEN (NORCO/VICODIN) 5-325 MG TABLET    Take 1 tablet by mouth every 6 (six) hours as needed for moderate pain.   I personally performed the services described in this documentation, which was scribed in my presence. The recorded information has been reviewed and is accurate.       Domenic Moras, PA-C 07/09/16 [place], MD 07/11/16 [phone]

## 2016-07-12 ENCOUNTER — Emergency Department (HOSPITAL_COMMUNITY)
Admission: EM | Admit: 2016-07-12 | Discharge: 2016-07-12 | Disposition: A | Discharge status: Home / Self Care | Payer: Medicare Other | Attending: Emergency Medicine | Admitting: Emergency Medicine

## 2016-07-12 ENCOUNTER — Encounter (HOSPITAL_COMMUNITY): Payer: Self-pay | Admitting: *Deleted

## 2016-07-12 DIAGNOSIS — M5410 Radiculopathy, site unspecified: Secondary | ICD-10-CM

## 2016-07-12 DIAGNOSIS — Z87891 Personal history of nicotine dependence: Secondary | ICD-10-CM | POA: Insufficient documentation

## 2016-07-12 DIAGNOSIS — E1140 Type 2 diabetes mellitus with diabetic neuropathy, unspecified: Secondary | ICD-10-CM | POA: Diagnosis not present

## 2016-07-12 DIAGNOSIS — M545 Low back pain: Secondary | ICD-10-CM | POA: Diagnosis present

## 2016-07-12 DIAGNOSIS — M5432 Sciatica, left side: Secondary | ICD-10-CM | POA: Diagnosis not present

## 2016-07-12 DIAGNOSIS — Z79899 Other long term (current) drug therapy: Secondary | ICD-10-CM | POA: Diagnosis not present

## 2016-07-12 DIAGNOSIS — Z7982 Long term (current) use of aspirin: Secondary | ICD-10-CM | POA: Insufficient documentation

## 2016-07-12 DIAGNOSIS — E1122 Type 2 diabetes mellitus with diabetic chronic kidney disease: Secondary | ICD-10-CM | POA: Diagnosis not present

## 2016-07-12 DIAGNOSIS — Z794 Long term (current) use of insulin: Secondary | ICD-10-CM | POA: Diagnosis not present

## 2016-07-12 DIAGNOSIS — Z8673 Personal history of transient ischemic attack (TIA), and cerebral infarction without residual deficits: Secondary | ICD-10-CM | POA: Diagnosis not present

## 2016-07-12 DIAGNOSIS — N183 Chronic kidney disease, stage 3 (moderate): Secondary | ICD-10-CM | POA: Insufficient documentation

## 2016-07-12 DIAGNOSIS — I129 Hypertensive chronic kidney disease with stage 1 through stage 4 chronic kidney disease, or unspecified chronic kidney disease: Secondary | ICD-10-CM | POA: Insufficient documentation

## 2016-07-12 MED ORDER — KETOROLAC TROMETHAMINE 30 MG/ML IJ SOLN
15.0000 mg | Freq: Once | INTRAMUSCULAR | Status: AC
  Administered 2016-07-12: 15 mg via INTRAMUSCULAR
  Filled 2016-07-12: qty 1

## 2016-07-12 MED ORDER — HYDROMORPHONE HCL 4 MG PO TABS: 4.0000 mg | ORAL_TABLET | ORAL | 0 refills | Status: DC | PRN

## 2016-07-12 MED ORDER — KETOROLAC TROMETHAMINE 30 MG/ML IJ SOLN: 30.0000 mg | Freq: Once | INTRAMUSCULAR | Status: DC

## 2016-07-12 MED ORDER — HYDROMORPHONE HCL 2 MG PO TABS: 2.0000 mg | ORAL_TABLET | ORAL | 0 refills | Status: DC | PRN

## 2016-07-12 NOTE — ED Triage Notes (Addendum)
Patient began having low left back pain radiating into hip and down left leg 5 days ago.  Patient denies trauma to back.  She was seen here for same 3 days ago and prescribed Flexeril and Vicodin.  Patient presents today with worsening low back pain and numbness that starts in low back and radiates down back of left leg in to foot.  Patient is able to flex and extend left foot with no foot drop noted.  Patient denies saddle anesthesia and urinary or fecal incontinence.

## 2016-07-12 NOTE — ED Provider Notes (Signed)
[facility]-EMERGENCY DEPT Provider Note   CSN: [id number] Arrival date & time: 07/12/16  0847     History   Chief Complaint Chief Complaint  Patient presents with  . Back Pain    HPI Anna [name] is a 70 y.o. female.  HPI Patient began having low left back pain radiating into hip and down left leg 5 days ago.  Patient denies trauma to back.  She was seen here for same 3 days ago and prescribed Flexeril and Vicodin.  Patient presents today with worsening low back pain and numbness that starts in low back and radiates down back of left leg in to foot.  Patient is able to flex and extend left foot with no foot drop noted.  Patient denies saddle anesthesia and urinary or fecal incontinence Past Medical History:  Diagnosis Date  . Arthritis   . Blood transfusion without reported diagnosis    40 plus yrs ago  . Cataract   . CKD (chronic kidney disease), stage III 08/10/2014  . Diabetes mellitus   . GERD (gastroesophageal reflux disease)   . Glaucoma   . Hyperlipidemia   . Hypertension     Patient Active Problem List   Diagnosis Date Noted  . Plantar warts 06/17/2016  . Benign essential HTN   . Cranial nerve VI palsy   . 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 Diplopia 11/20/2015  . Abdominal pain, epigastric 03/02/2015  . TIA (transient ischemic attack) 10/25/2014  . CKD (chronic kidney disease), stage III 08/10/2014  . HLD (hyperlipidemia) 06/12/2014  . Right shoulder pain 06/12/2014  . Depression 12/03/2012  . History of tobacco use 06/09/2009  . VAGINITIS, ATROPHIC 10/25/2008  . COLONIC POLYPS 01/21/2007  . Diabetes mellitus with diabetic neuropathy, with long-term current use of insulin (HCC) 11/19/2006  . OBESITY, NOS 11/19/2006  . GLAUCOMA 11/19/2006  . CATARACT 11/19/2006  . HYPERTENSION, BENIGN SYSTEMIC 11/19/2006  . DIVERTICULOSIS OF COLON 11/19/2006  . PROTEINURIA 11/19/2006    Past Surgical History:  Procedure Laterality Date  .  ABDOMINAL HYSTERECTOMY    . COLONOSCOPY    . POLYPECTOMY      OB History    No data available       Home Medications    Prior to Admission medications   Medication Sig Start Date End Date Taking? Authorizing Provider  amLODipine (NORVASC) 5 MG tablet Take 1 tablet (5 mg total) by mouth daily. 11/27/14 12/28/15  James R Joyner, MD  aspirin 81 MG tablet Take 1 tablet (81 mg total) by mouth daily. 11/27/14   James R Joyner, MD  atorvastatin (LIPITOR) 40 MG tablet TAKE ONE TABLET BY MOUTH ONCE DAILY 04/14/16   Abdoulaye Diallo, MD  bimatoprost (LUMIGAN) 0.01 % SOLN Place 1 drop into both eyes at bedtime. 11/27/14   James R Joyner, MD  Blood Glucose Monitoring Suppl (ONE TOUCH ULTRA 2) w/Device KIT 1 Device by Does not apply route once. 11/29/15   James R Joyner, MD  cyclobenzaprine (FLEXERIL) 10 MG tablet Take 1 tablet (10 mg total) by mouth 2 (two) times daily as needed for muscle spasms. 07/09/16   Bowie Tran, PA-C  glucose blood (ONE TOUCH ULTRA TEST) test strip Check sugar 3-4 x daily as needed 11/29/15   James R Joyner, MD  HYDROmorphone (DILAUDID) 4 MG tablet Take 1 tablet (4 mg total) by mouth every 4 (four) hours as needed for severe pain. 07/12/16   Robert Beaton, MD  ibuprofen (ADVIL,MOTRIN)   600 MG tablet Take 1 tablet (600 mg total) by mouth every 8 (eight) hours as needed. Patient not taking: Reported on 11/29/2015 11/12/15   Lind Covert, MD  INVOKANA 100 MG TABS tablet TAKE ONE TABLET BY MOUTH ONCE DAILY 02/04/16   Olam Idler, MD  Lancets [facility] ULTRASOFT) lancets Check blood sugar 3-4 times daily as needed 11/29/15   Olam Idler, MD  LANTUS 100 UNIT/ML injection INJECT 60 UNITS SUBCUTANEOUSLY ONCE DAILY 12/18/15   Olam Idler, MD  metoprolol succinate (TOPROL XL) 25 MG 24 hr tablet Take 1 tablet (25 mg total) by mouth daily. 11/27/14 12/14/15  Olam Idler, MD  pantoprazole (PROTONIX) 40 MG tablet Take 1 tablet (40 mg total) by mouth daily. 06/06/15   Olam Idler, MD    quinapril-hydrochlorothiazide (ACCURETIC) 20-25 MG per tablet Take 1 tablet by mouth daily. 11/27/14   Olam Idler, MD  sertraline (ZOLOFT) 50 MG tablet Take 1 tablet (50 mg total) by mouth daily. 11/27/14   Olam Idler, MD    Family History Family History  Problem Relation Age of Onset  . Diabetes Daughter   . Hyperlipidemia Mother   . Diabetes Mother   . Cancer Brother   . Hyperlipidemia Brother   . Colon cancer Neg Hx   . Colon polyps Neg Hx   . Rectal cancer Neg Hx   . Stomach cancer Neg Hx   . Esophageal cancer Neg Hx     Social History Social History  Substance Use Topics  . Smoking status: Former Smoker    Years: 30.00    Types: Cigarettes    Quit date: 10/09/2014  . Smokeless tobacco: Never Used     Comment: 1 cig a week  . Alcohol use 0.6 oz/week    1 Cans of beer per week     Comment: occasional beer     Allergies   Metformin   Review of Systems Review of Systems   Physical Exam Updated Vital Signs BP 136/71 (BP Location: Left Arm)   Pulse 89   Temp 98.3 F (36.8 C) (Oral)   Resp 18   Ht 5' 2" (1.575 m)   Wt 164 lb (74.4 kg)   SpO2 100%   BMI 30.00 kg/m   Physical Exam  Constitutional: She is oriented to person, place, and time. She appears well-developed and well-nourished. No distress.  HENT:  Head: Normocephalic and atraumatic.  Eyes: Pupils are equal, round, and reactive to light.  Neck: Normal range of motion.  Cardiovascular: Normal rate and intact distal pulses.   Pulmonary/Chest: No respiratory distress.  Abdominal: Soft. Normal appearance and bowel sounds are normal. There is no tenderness.  Musculoskeletal: Normal range of motion.       Back:  Neurological: She is alert and oriented to person, place, and time. She has normal strength. No cranial nerve deficit. GCS eye subscore is 4. GCS verbal subscore is 5. GCS motor subscore is 6.  Skin: Skin is warm and dry. No rash noted.  Psychiatric: She has a normal mood and affect.  Her behavior is normal.  Nursing note and vitals reviewed.    ED Treatments / Results  Labs (all labs ordered are listed, but only abnormal results are displayed) Labs Reviewed - No data to display  EKG  EKG Interpretation None       Radiology No results found.  Procedures Procedures (including critical care time)  Medications Ordered in ED Medications  ketorolac (TORADOL)  30 MG/ML injection 15 mg (15 mg Intramuscular Given 07/12/16 1003)     Initial Impression / Assessment and Plan / ED Course  I have reviewed the triage vital signs and the nursing notes.  Pertinent labs & imaging results that were available during my care of the patient were reviewed by me and considered in my medical decision making (see chart for details).  Clinical Course      Final Clinical Impressions(s) / ED Diagnoses   Final diagnoses:  Radicular pain of left lower extremity  Sciatica of left side    New Prescriptions New Prescriptions   HYDROMORPHONE (DILAUDID) 4 MG TABLET    Take 1 tablet (4 mg total) by mouth every 4 (four) hours as needed for severe pain.     Robert Beaton, MD 08/03/16 1038  

## 2016-07-12 NOTE — Consult Note (Signed)
Reason for Consult:* ** Back and Left leg pain Referring Physician: Dr.Beaton  Anna [name] is an 70 y.o. female.  HPI: Patien recently developed Left Leg Pain. She is a diabetic. No history of trauma.  Past Medical History:  Diagnosis Date  . Arthritis   . Blood transfusion without reported diagnosis    40 plus yrs ago  . Cataract   . CKD (chronic kidney disease), stage III 08/10/2014  . Diabetes mellitus   . GERD (gastroesophageal reflux disease)   . Glaucoma   . Hyperlipidemia   . Hypertension     Past Surgical History:  Procedure Laterality Date  . ABDOMINAL HYSTERECTOMY    . COLONOSCOPY    . POLYPECTOMY      Family History  Problem Relation Age of Onset  . Diabetes Daughter   . Hyperlipidemia Mother   . Diabetes Mother   . Cancer Brother   . Hyperlipidemia Brother   . Colon cancer Neg Hx   . Colon polyps Neg Hx   . Rectal cancer Neg Hx   . Stomach cancer Neg Hx   . Esophageal cancer Neg Hx     Social History:  reports that she quit smoking about 21 months ago. Her smoking use included Cigarettes. She quit after 30.00 years of use. She has never used smokeless tobacco. She reports that she drinks about 0.6 oz of alcohol per week . She reports that she does not use drugs.  Allergies:  Allergies  Allergen Reactions  . Metformin     REACTION: GI upset - Diarrhea    Medications: I have reviewed the patient's current medications.  No results found for this or any previous visit (from the past 48 hour(s)).  No results found.  Review of Systems  Constitutional: Negative.   HENT: Negative.   Eyes: Negative.   Respiratory: Negative.   Cardiovascular: Negative.   Gastrointestinal: Negative.   Genitourinary: Negative.   Musculoskeletal: Positive for back pain.  Skin: Negative.    Blood pressure 136/71, pulse 89, temperature 98.3 F (36.8 C), temperature source Oral, resp. rate 18, height 5\' 2"  (1.575 m), weight 74.4 kg (164 lb), SpO2 100 %. Physical  Exam  Constitutional: She appears well-developed.  HENT:  Head: Normocephalic.  Eyes: Pupils are equal, round, and reactive to light.  Neck: Normal range of motion.  Cardiovascular: Normal rate.   Respiratory: Effort normal.  GI: Soft.  Musculoskeletal:  Pain with back motion.No spasms.  Neurological:  She is a diabetic and neurologically intact.  Skin: Skin is warm.  Psychiatric: She has a normal mood and affect.    Assessment/Plan: Dilaudid for pain. Will follow-up in office and if not improved will do a =n MRI Lumbar.  Anna [name] A 07/12/2016, 10:28 AM

## 2016-07-12 NOTE — ED Notes (Signed)
Per verbal Gioffre MD discontinue MR will be done outpatient/in office.

## 2016-07-14 ENCOUNTER — Other Ambulatory Visit: Payer: Self-pay | Admitting: Family Medicine

## 2016-08-26 ENCOUNTER — Other Ambulatory Visit: Payer: Self-pay | Admitting: *Deleted

## 2016-08-26 DIAGNOSIS — I10 Essential (primary) hypertension: Secondary | ICD-10-CM

## 2016-08-26 MED ORDER — QUINAPRIL-HYDROCHLOROTHIAZIDE 20-25 MG PO TABS: 1.0000 | ORAL_TABLET | Freq: Every day | ORAL | 3 refills | Status: DC

## 2016-10-06 ENCOUNTER — Ambulatory Visit (INDEPENDENT_AMBULATORY_CARE_PROVIDER_SITE_OTHER): Payer: Medicare Other | Admitting: Family Medicine

## 2016-10-06 ENCOUNTER — Encounter: Payer: Self-pay | Admitting: Family Medicine

## 2016-10-06 VITALS — BP 132/64 | HR 62 | Temp 98.0°F | Ht 62.0 in | Wt 163.0 lb

## 2016-10-06 DIAGNOSIS — R1319 Other dysphagia: Secondary | ICD-10-CM

## 2016-10-06 DIAGNOSIS — R1310 Dysphagia, unspecified: Secondary | ICD-10-CM

## 2016-10-06 DIAGNOSIS — Z794 Long term (current) use of insulin: Secondary | ICD-10-CM

## 2016-10-06 DIAGNOSIS — E119 Type 2 diabetes mellitus without complications: Secondary | ICD-10-CM

## 2016-10-06 LAB — POCT GLYCOSYLATED HEMOGLOBIN (HGB A1C): HEMOGLOBIN A1C: 7.3

## 2016-10-06 MED ORDER — CANAGLIFLOZIN 100 MG PO TABS: 100.0000 mg | ORAL_TABLET | Freq: Every day | ORAL | 1 refills | Status: DC

## 2016-10-06 NOTE — Progress Notes (Signed)
   Subjective:    Patient ID: Anna [name], female    DOB: [birth year], 70 y.o.   MRN: [id number]   CC: Diabetes follow up  HPI: Patient is a 71 yo female with a past medical history significant for HTN, DMII,  TIA, and CKDIII who presents today to clinic for follow on her diabetes.  Type 2 DM: Patient is reported that she has been avoiding sweet for the past few month and has been trying to eat better. She reports taking her medications as prescribed, with good adherence to her regimen.  Difficulty swallowing: Patient reports having difficulty swallowing for a few weeks. She reports food "balls up in her throat" and can be difficult to swallow. She has not had any difficulty with fluid.  Leg pain: Patient reports right lateral lower leg burning sensation followed by itchiness. Patient reports symptoms onset were recent within the past month. Patient denies any numbness or tingling in her lower extremity Smoking status reviewed   ROS: all other systems were reviewed and are negative other than in the HPI   Past medical history, surgical, family, and social history reviewed and updated in the EMR as appropriate.  Objective:  BP 132/64   Pulse 62   Temp 98 F (36.7 C) (Oral)   Ht 5\' 2"  (1.575 m)   Wt 163 lb (73.9 kg)   SpO2 99%   BMI 29.81 kg/m   Vitals and nursing note reviewed  General: NAD, pleasant, able to participate in exam Cardiac: RRR, normal heart sounds, no murmurs. 2+ radial and PT pulses bilaterally Respiratory: CTAB, normal effort, No wheezes, rales or rhonchi Abdomen: soft, nontender, nondistended, no hepatic or splenomegaly, +BS Extremities: Lateral right leg dry skin with excoriations noted on exam. Skin: warm and dry, no rashes noted Neuro: alert and oriented x4, no focal deficits Psych: Normal affect and mood   Assessment & Plan:    #Type 2 DM Patient last A1 in 05/2016 was 7.7. Today A1c is 7.3. Given patient age and comorbidities, patient is at  goal. Would not pursue more aggressive control of A1c. --Continue current regimen of Lantus and Invokana --Will follow up in three months  #Dysphagia  Patient describes symptoms that are consistent with dysphagia. Ddx would include stricture, barrett esophagus, achalasia or possible carcinoma. Patient on protonix 40 mg daily. --Would refer to GI for further assessment. Patient will probably need an EGD  # Right Leg pain and itchiness Some dry skin noted on exam with some excoriations secondary to itching. --Moisturize skin regularly given cold and dry weather  Marjie Skiff, MD Family Medicine Resident PGY-1

## 2016-10-06 NOTE — Patient Instructions (Signed)
It was great seeing you today! We have addressed the following issues today  1. Your A1c was 7.3 improved from 7.7.  Please continue current diet and medication regimen. 2. I will refer you to Gastroenterology to discuss your difficulty swallowing. 3. Make an appointment in a few weeks so we can discuss your results. 4. I refill your Invokana and send it to your pharmacy.  If we did any lab work today, and the results require attention, either me or my nurse will get in touch with you. If everything is normal, you will get a letter in mail and a message via . If you don't hear from [name] in two weeks, please give [name] a call. Otherwise, we look forward to seeing you again at your next visit. If you have any questions or concerns before then, please call the clinic at [phone].  Please bring all your medications to every doctors visit  Sign up for My Chart to have easy access to your labs results, and communication with your Primary care physician.    Please check-out at the front desk before leaving the clinic.    Take Care,

## 2016-10-20 ENCOUNTER — Encounter (HOSPITAL_COMMUNITY): Payer: Self-pay | Admitting: Emergency Medicine

## 2016-10-20 ENCOUNTER — Emergency Department (HOSPITAL_COMMUNITY)
Admission: EM | Admit: 2016-10-20 | Discharge: 2016-10-20 | Disposition: A | Discharge status: Home / Self Care | Payer: Medicare Other | Attending: Emergency Medicine | Admitting: Emergency Medicine

## 2016-10-20 ENCOUNTER — Emergency Department (HOSPITAL_COMMUNITY): Payer: Medicare Other

## 2016-10-20 DIAGNOSIS — Z87891 Personal history of nicotine dependence: Secondary | ICD-10-CM | POA: Insufficient documentation

## 2016-10-20 DIAGNOSIS — N183 Chronic kidney disease, stage 3 (moderate): Secondary | ICD-10-CM | POA: Insufficient documentation

## 2016-10-20 DIAGNOSIS — I129 Hypertensive chronic kidney disease with stage 1 through stage 4 chronic kidney disease, or unspecified chronic kidney disease: Secondary | ICD-10-CM | POA: Diagnosis not present

## 2016-10-20 DIAGNOSIS — E1122 Type 2 diabetes mellitus with diabetic chronic kidney disease: Secondary | ICD-10-CM | POA: Diagnosis not present

## 2016-10-20 DIAGNOSIS — Z8673 Personal history of transient ischemic attack (TIA), and cerebral infarction without residual deficits: Secondary | ICD-10-CM | POA: Diagnosis not present

## 2016-10-20 DIAGNOSIS — R0789 Other chest pain: Secondary | ICD-10-CM

## 2016-10-20 DIAGNOSIS — R079 Chest pain, unspecified: Secondary | ICD-10-CM | POA: Diagnosis present

## 2016-10-20 DIAGNOSIS — E1140 Type 2 diabetes mellitus with diabetic neuropathy, unspecified: Secondary | ICD-10-CM | POA: Diagnosis not present

## 2016-10-20 LAB — CBC
HEMATOCRIT: 37.2 % (ref 36.0–46.0)
Hemoglobin: 11.9 g/dL — ABNORMAL LOW (ref 12.0–15.0)
MCH: 27.3 pg (ref 26.0–34.0)
MCHC: 32 g/dL (ref 30.0–36.0)
MCV: 85.3 fL (ref 78.0–100.0)
Platelets: 252 10*3/uL (ref 150–400)
RBC: 4.36 MIL/uL (ref 3.87–5.11)
RDW: 14.7 % (ref 11.5–15.5)
WBC: 5.8 10*3/uL (ref 4.0–10.5)

## 2016-10-20 LAB — BASIC METABOLIC PANEL
Anion gap: 8 (ref 5–15)
BUN: 20 mg/dL (ref 6–20)
CHLORIDE: 107 mmol/L (ref 101–111)
CO2: 27 mmol/L (ref 22–32)
Calcium: 9.2 mg/dL (ref 8.9–10.3)
Creatinine, Ser: 1.15 mg/dL — ABNORMAL HIGH (ref 0.44–1.00)
GFR calc Af Amer: 55 mL/min — ABNORMAL LOW (ref >60)
GFR calc non Af Amer: 48 mL/min — ABNORMAL LOW (ref >60)
GLUCOSE: 188 mg/dL — AB (ref 65–99)
POTASSIUM: 3.9 mmol/L (ref 3.5–5.1)
Sodium: 142 mmol/L (ref 135–145)

## 2016-10-20 LAB — I-STAT TROPONIN, ED: Troponin i, poc: 0 ng/mL (ref 0.00–0.08)

## 2016-10-20 MED ORDER — LIDOCAINE 5 % EX PTCH: 1.0000 | MEDICATED_PATCH | CUTANEOUS | 0 refills | Status: DC

## 2016-10-20 NOTE — ED Triage Notes (Signed)
Patient reports chest pain starting last night. Patient states when she would turn to her left in bed she would have chest pain; denies chest pain when she turned to the right. Denies shortness of breath, nausea/vomiting, back pain, dizziness.

## 2016-10-20 NOTE — Discharge Instructions (Signed)

## 2016-10-20 NOTE — ED Provider Notes (Signed)
Emergency Department Provider Note   I have reviewed the triage vital signs and the nursing notes.   HISTORY  Chief Complaint Chest Pain   HPI Anna [name] is a 71 y.o. female with PMH of TIA, CKD, HTN, HLD, and GERD presents to the emergency department for evaluation of sharp left-sided chest pain. Patient states she rolled over in bed and noticed a sharp pain in the left side of her chest. Throughout the night she had pain only when rolling to the left. Pain was alleviated with sleeping in other positions. No radiation of pain. She describes it as sharp and severe. She denies any associated diaphoresis, nausea, pain in the jaw or arms. No dyspnea. No history of prior. She does watch her grandchildren during the day and has been lifting them more than normal recently.   Past Medical History:  Diagnosis Date  . Arthritis   . Blood transfusion without reported diagnosis    40 plus yrs ago  . Cataract   . CKD (chronic kidney disease), stage III 08/10/2014  . Diabetes mellitus   . GERD (gastroesophageal reflux disease)   . Glaucoma   . Hyperlipidemia   . Hypertension     Patient Active Problem List   Diagnosis Date Noted  . Plantar warts 06/17/2016  . Benign essential HTN   . Cranial nerve VI palsy   . 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 Diplopia 11/20/2015  . Abdominal pain, epigastric 03/02/2015  . TIA (transient ischemic attack) 10/25/2014  . CKD (chronic kidney disease), stage III 08/10/2014  . HLD (hyperlipidemia) 06/12/2014  . Right shoulder pain 06/12/2014  . Depression 12/03/2012  . History of tobacco use 06/09/2009  . VAGINITIS, ATROPHIC 10/25/2008  . COLONIC POLYPS 01/21/2007  . Diabetes mellitus with diabetic neuropathy, with long-term current use of insulin ([place]) 11/19/2006  . OBESITY, NOS 11/19/2006  . GLAUCOMA 11/19/2006  . CATARACT 11/19/2006  . HYPERTENSION, BENIGN SYSTEMIC 11/19/2006  . DIVERTICULOSIS OF COLON  11/19/2006  . PROTEINURIA 11/19/2006    Past Surgical History:  Procedure Laterality Date  . ABDOMINAL HYSTERECTOMY    . COLONOSCOPY    . POLYPECTOMY      Current Outpatient Rx  . Order #: [id number] Class: Print  . Order #: [id number] Class: Print  . Order #: [id number] Class: Normal  . Order #: [id number] Class: Print  . Order #: [id number] Class: Normal  . Order #: [id number] Class: Normal  . Order #: [id number] Class: Print  . Order #: [id number] Class: Normal  . Order #: [id number] Class: Print  . Order #: [id number] Class: Normal  . Order #: [id number] Class: Normal  . Order #: [id number] Class: Normal  . Order #: [id number] Class: Print  . Order #: [id number] Class: Print  . Order #: [id number] Class: Print  . Order #: [id number] Class: Normal  . Order #: [id number] Class: Print    Allergies Metformin  Family History  Problem Relation Age of Onset  . Diabetes Daughter   . Hyperlipidemia Mother   . Diabetes Mother   . Cancer Brother   . Hyperlipidemia Brother   . Colon cancer Neg Hx   . Colon polyps Neg Hx   . Rectal cancer Neg Hx   . Stomach cancer Neg Hx   . Esophageal cancer Neg Hx     Social History Social History  Substance Use Topics  . Smoking status: Former Smoker    Years: 30.00    Types: Cigarettes    Quit date: 10/09/2014  . Smokeless tobacco: Never Used  Comment: 1 cig a week  . Alcohol use 0.6 oz/week    1 Cans of beer per week     Comment: occasional beer    Review of Systems  Constitutional: No fever/chills Eyes: No visual changes. ENT: No sore throat. Cardiovascular: Positive chest pain. Respiratory: Denies shortness of breath. Gastrointestinal: No abdominal pain.  No nausea, no vomiting.  No diarrhea.  No constipation. Genitourinary: Negative for dysuria. Musculoskeletal: Negative for back pain.  Skin: Negative for rash. Neurological: Negative for headaches, focal weakness or numbness.  10-point ROS otherwise  negative.  ____________________________________________   PHYSICAL EXAM:  VITAL SIGNS: ED Triage Vitals  Enc Vitals Group     BP 10/20/16 0843 148/81     Pulse Rate 10/20/16 0843 81     Resp 10/20/16 0843 16     Temp 10/20/16 0843 98.1 F (36.7 C)     Temp Source 10/20/16 0843 Oral     SpO2 10/20/16 0843 99 %     Weight 10/20/16 0842 163 lb (73.9 kg)     Height 10/20/16 0842 5\' 2"  (1.575 m)     Pain Score 10/20/16 0842 0   Constitutional: Alert and oriented. Well appearing and in no acute distress. Eyes: Conjunctivae are normal.  Head: Atraumatic. Nose: No congestion/rhinnorhea. Mouth/Throat: Mucous membranes are moist.  Oropharynx non-erythematous. Neck: No stridor.  Cardiovascular: Normal rate, regular rhythm. Good peripheral circulation. Grossly normal heart sounds.   Respiratory: Normal respiratory effort.  No retractions. Lungs CTAB. Gastrointestinal: Soft and nontender. No distention.  Musculoskeletal: No lower extremity tenderness nor edema. No gross deformities of extremities. No tenderness to palpation of the anterior chest wall.  Neurologic:  Normal speech and language. No gross focal neurologic deficits are appreciated.  Skin:  Skin is warm, dry and intact. No rash noted. Psychiatric: Mood and affect are normal. Speech and behavior are normal.  ____________________________________________   LABS (all labs ordered are listed, but only abnormal results are displayed)  Labs Reviewed  BASIC METABOLIC PANEL - Abnormal; Notable for the following:       Result Value   Glucose, Bld 188 (*)    Creatinine, Ser 1.15 (*)    GFR calc non Af Amer 48 (*)    GFR calc Af Amer 55 (*)    All other components within normal limits  CBC - Abnormal; Notable for the following:    Hemoglobin 11.9 (*)    All other components within normal limits  I-STAT TROPOININ, ED   ____________________________________________  EKG   EKG Interpretation  Date/Time:  Monday October 20 2016 10:12:07 EST Ventricular Rate:  74 PR Interval:    QRS Duration: 88 QT Interval:  422 QTC Calculation: 469 R Axis:   54 Text Interpretation:  Sinus rhythm Probable left atrial enlargement No STEMI.  Confirmed by LONG MD, JOSHUA [phone]) on 10/20/2016 10:14:59 AM       ____________________________________________  RADIOLOGY  Dg Chest 2 View  Result Date: 10/20/2016 CLINICAL DATA:  70 year old female with mid left chest pain starting last night. Hypertension. Diabetes. Prior smoker. Initial encounter. EXAM: CHEST  2 VIEW COMPARISON:  10/21/2014, 02/04/2014 an 09/12/2012 chest x-ray. FINDINGS: No infiltrate, congestive heart failure or pneumothorax. Minimal peribronchial thickening stable. Linear scarring left lung stable. No plain film evidence of pulmonary malignancy. Heart size within normal limits. Calcified tortuous aorta. Mild scoliosis thoracic spine with degenerative changes. Bilateral acromioclavicular joint degenerative change greater on the right. IMPRESSION: No infiltrate. Chronic minimal peribronchial thickening. Calcified tortuous aorta. Scoliosis.  Electronically Signed   By: Genia Del M.D.   On: 10/20/2016 09:29    ____________________________________________   PROCEDURES  Procedure(s) performed:   Procedures  None ____________________________________________   INITIAL IMPRESSION / ASSESSMENT AND PLAN / ED COURSE  Pertinent labs & imaging results that were available during my care of the patient were reviewed by me and considered in my medical decision making (see chart for details).  Patient resents to the emergency department for evaluation of left-sided chest pain. Patient only having pain when turning or rolling to the left. No pain at rest. No other signs or symptoms to suggest ACS, PE, or aortic dissection. Patient has underlying chronic kidney disease and cannot take Motrin. I advised Tylenol and will discharge with Lidoderm patch. Patient cannot take  Motrin with CKD. I discussed this with her. Advised heat application and rest. Will follow with PCP.   At this time, I do not feel there is any life-threatening condition present. I have reviewed and discussed all results (EKG, imaging, lab, urine as appropriate), exam findings with patient. I have reviewed nursing notes and appropriate previous records.  I feel the patient is safe to be discharged home without further emergent workup. Discussed usual and customary return precautions. Patient and family (if present) verbalize understanding and are comfortable with this plan.  Patient will follow-up with their primary care provider. If they do not have a primary care provider, information for follow-up has been provided to them. All questions have been answered.  ____________________________________________  FINAL CLINICAL IMPRESSION(S) / ED DIAGNOSES  Final diagnoses:  Atypical chest pain     MEDICATIONS GIVEN DURING THIS VISIT:  None  NEW OUTPATIENT MEDICATIONS STARTED DURING THIS VISIT:  New Prescriptions   LIDOCAINE (LIDODERM) 5 %    Place 1 patch onto the skin daily. Remove & Discard patch within 12 hours or as directed by MD     Note:  This document was prepared using Dragon voice recognition software and may include unintentional dictation errors.  Nanda Quinton, MD Emergency Medicine   Margette Fast, MD 10/20/16 1016

## 2016-10-20 NOTE — ED Notes (Signed)
ED Provider at bedside. 

## 2016-10-31 ENCOUNTER — Other Ambulatory Visit: Payer: Self-pay | Admitting: Family Medicine

## 2016-10-31 DIAGNOSIS — Z794 Long term (current) use of insulin: Principal | ICD-10-CM

## 2016-10-31 DIAGNOSIS — E119 Type 2 diabetes mellitus without complications: Secondary | ICD-10-CM

## 2016-11-27 MED ORDER — CANAGLIFLOZIN 100 MG PO TABS: 100.0000 mg | ORAL_TABLET | Freq: Every day | ORAL | 3 refills | Status: DC

## 2016-11-27 NOTE — Addendum Note (Signed)
Addended by: Marjie Skiff F on: 11/27/2016 01:27 PM   Modules accepted: Orders

## 2016-12-05 ENCOUNTER — Encounter (INDEPENDENT_AMBULATORY_CARE_PROVIDER_SITE_OTHER): Payer: Self-pay

## 2016-12-05 ENCOUNTER — Encounter: Payer: Self-pay | Admitting: Gastroenterology

## 2016-12-05 ENCOUNTER — Ambulatory Visit (INDEPENDENT_AMBULATORY_CARE_PROVIDER_SITE_OTHER): Payer: Medicare Other | Admitting: Gastroenterology

## 2016-12-05 VITALS — BP 120/80 | HR 72 | Ht 61.0 in | Wt 165.1 lb

## 2016-12-05 DIAGNOSIS — R1312 Dysphagia, oropharyngeal phase: Secondary | ICD-10-CM

## 2016-12-05 NOTE — Patient Instructions (Addendum)
MBSS with speech therapy for swallowing difficulty.  You have been scheduled for a modified barium swallow on 12/16/16 at 1 pm. Please arrive 15 minutes prior to your test for registration. You will go to [facility] (1st Floor) for your appointment.  Should you need to cancel or reschedule your appointment, please contact [phone] [place] [facility]) or [phone] [place]). _____________________________________________________________________ A Modified Barium Swallow Study, or MBS, is a special x-ray that is taken to check swallowing skills. It is carried out by a Stage manager and a Psychologist, clinical (SLP). During this test, yourmouth, throat, and esophagus, a muscular tube which connects your mouth to your stomach, is checked. The test will help you, your doctor, and the SLP plan what types of foods and liquids are easier for you to swallow. The SLP will also identify positions and ways to help you swallow more easily and safely. What will happen during an MBS? You will be taken to an x-ray room and seated comfortably. You will be asked to swallow small amounts of food and liquid mixed with barium. Barium is a liquid or paste that allows images of your mouth, throat and esophagus to be seen on x-ray. The x-ray captures moving images of the food you are swallowing as it travels from your mouth through your throat and into your esophagus. This test helps identify whether food or liquid is entering your lungs (aspiration). The test also shows which part of your mouth or throat lacks strength or coordination to move the food or liquid in the right direction. This test typically takes 30 minutes to 1 hour to complete. _______________________________________________________________________   IF this is not helpful, then you may need EGD.

## 2016-12-05 NOTE — Progress Notes (Signed)
Review of pertinent gastrointestinal problems: 1. History of precancerous colon polyps. Colonoscopy 2009, single sessile serrated adenoma was removed. The colonoscopy November 2015 found 3 polyps, one was a tubular adenoma. She was recommended to have repeat surveillance colonoscopy at five-year interval.  HPI: This is a  very pleasant 71 year old woman  who was referred to me by Marjie Skiff, MD  to evaluate  trouble swallowing .    I have seen her for before for colon cancer screening, polyp surveillance. She is here today for a new problem.  Chief complaint is swallowing difficulty  Swells up in her throat, cannot swollow.  She says she will be chewing food and begin to initiate a swallow but it just won't exit her mouth. She just cannot swallow sometimes. She does not have a sensation of being able to swallow and then food hanging or catching as it goes down.  Ends up spitting it out.  Can happen daily.  Food is tasting poorly for her.  Overall her weight has been stable.  Takes protonix for tight sensation in her chest.  ACid taste in her mouth. Not sure if this helps at all.  No significant abdominal pains, no overt GI bleeding. No nausea.   Review of systems: Pertinent positive and negative review of systems were noted in the above HPI section. Complete review of systems was performed and was otherwise normal.   Past Medical History:  Diagnosis Date  . Arthritis   . Blood transfusion without reported diagnosis    40 plus yrs ago  . Cataract   . CKD (chronic kidney disease), stage III 08/10/2014  . Diabetes mellitus   . GERD (gastroesophageal reflux disease)   . Glaucoma   . Hyperlipidemia   . Hypertension     Past Surgical History:  Procedure Laterality Date  . ABDOMINAL HYSTERECTOMY    . COLONOSCOPY    . POLYPECTOMY      Current Outpatient Prescriptions  Medication Sig Dispense Refill  . amLODipine (NORVASC) 5 MG tablet Take 1 tablet (5 mg total) by  mouth daily. 90 tablet 3  . aspirin 81 MG tablet Take 1 tablet (81 mg total) by mouth daily. 30 tablet 11  . atorvastatin (LIPITOR) 40 MG tablet TAKE ONE TABLET BY MOUTH ONCE DAILY 90 tablet 0  . Blood Glucose Monitoring Suppl (ONE TOUCH ULTRA 2) w/Device KIT 1 Device by Does not apply route once. 1 each 0  . canagliflozin (INVOKANA) 100 MG TABS tablet Take 1 tablet (100 mg total) by mouth daily. 30 tablet 3  . cyclobenzaprine (FLEXERIL) 10 MG tablet Take 1 tablet (10 mg total) by mouth 2 (two) times daily as needed for muscle spasms. 20 tablet 0  . glucose blood (ONE TOUCH ULTRA TEST) test strip Check sugar 3-4 x daily as needed 200 each 3  . HYDROmorphone (DILAUDID) 4 MG tablet Take 1 tablet (4 mg total) by mouth every 4 (four) hours as needed for severe pain. 30 tablet 0  . Lancets (ONETOUCH ULTRASOFT) lancets Check blood sugar 3-4 times daily as needed 100 each 12  . LANTUS 100 UNIT/ML injection INJECT 60 UNITS SUBCUTANEOUSLY ONCE DAILY 20 mL 5  . lidocaine (LIDODERM) 5 % Place 1 patch onto the skin daily. Remove & Discard patch within 12 hours or as directed by MD 30 patch 0  . metoprolol succinate (TOPROL XL) 25 MG 24 hr tablet Take 1 tablet (25 mg total) by mouth daily. 90 tablet 3  . pantoprazole (PROTONIX) 40 MG  tablet Take 1 tablet (40 mg total) by mouth daily. 30 tablet 3  . quinapril-hydrochlorothiazide (ACCURETIC) 20-25 MG tablet Take 1 tablet by mouth daily. 90 tablet 3  . sertraline (ZOLOFT) 50 MG tablet Take 1 tablet (50 mg total) by mouth daily. 30 tablet 3   No current facility-administered medications for this visit.     Allergies as of 12/05/2016 - Review Complete 12/05/2016  Allergen Reaction Noted  . Metformin  04/22/2010    Family History  Problem Relation Age of Onset  . Diabetes Daughter   . Hyperlipidemia Mother   . Diabetes Mother   . Cancer Brother   . Hyperlipidemia Brother   . Colon cancer Neg Hx   . Colon polyps Neg Hx   . Rectal cancer Neg Hx   .  Stomach cancer Neg Hx   . Esophageal cancer Neg Hx     Social History   Social History  . Marital status: Divorced    Spouse name: N/A  . Number of children: 4  . Years of education: 10   Occupational History  . Retired    Social History Main Topics  . Smoking status: Former Smoker    Years: 30.00    Types: Cigarettes    Quit date: 10/09/2014  . Smokeless tobacco: Never Used     Comment: 1 cig a week  . Alcohol use 0.6 oz/week    1 Cans of beer per week     Comment: occasional beer  . Drug use: No  . Sexual activity: Not on file   Other Topics Concern  . Not on file   Social History Narrative   Health Care POA:    Emergency Contact: Gizelle Whetsel (daughter) [phone]   End of Life Plan: Pt will bring copy of living will to next visit   Who lives with you: By herself   Any pets: 0   Diet: Pt reports recently reducing her intake of starches and carbohydrates. Pt reports eating more fresh fruits and vegetables to help reduce her A1C.     Exercise: Pt reports using the treadmill for 60 minutes, 2-3 times per week.    Seatbelts: Pt reports wearing a seatbelt when in vehicle.   Nancy Fetter Exposure/Protection: Pt denies wearing any protection but mentioned she will start due to side effects of her recently prescribed medication.    Hobbies: Reading and going to movies with family.     Physical Exam: BP 120/80   Pulse 72   Ht '5\' 1"'  (1.549 m)   Wt 165 lb 2 oz (74.9 kg)   BMI 31.20 kg/m  Constitutional: generally well-appearing Psychiatric: alert and oriented x3 Eyes: extraocular movements intact Mouth: oral pharynx moist, no lesions Neck: supple no lymphadenopathy Cardiovascular: heart regular rate and rhythm Lungs: clear to auscultation bilaterally Abdomen: soft, nontender, nondistended, no obvious ascites, no peritoneal signs, normal bowel sounds Extremities: no lower extremity edema bilaterally Skin: no lesions on visible extremities   Assessment and plan: 71  y.o. female with  Trouble swallowing  She describes that she will be eating food and immediately after she tries to initiate a swallow foods will not be able to exit her mouth. She will end up spitting it out. She does not describe a sensation of esophageal dysphasia. I am more concerned about oral pharyngeal process here and esophageal process. I'm going to arrange for her to meet with speech therapist for modified barium swallow testing to begin the workup.    Please see the "  Patient Instructions" section for addition details about the plan.   Owens Loffler, MD [facility] 12/05/2016, 2:16 PM  Cc: Marjie Skiff, MD

## 2016-12-08 ENCOUNTER — Other Ambulatory Visit (HOSPITAL_COMMUNITY): Payer: Self-pay | Admitting: Gastroenterology

## 2016-12-08 DIAGNOSIS — R1319 Other dysphagia: Secondary | ICD-10-CM

## 2016-12-16 ENCOUNTER — Ambulatory Visit (HOSPITAL_COMMUNITY)
Admission: RE | Admit: 2016-12-16 | Discharge: 2016-12-16 | Disposition: A | Discharge status: Home / Self Care | Payer: Medicare Other | Source: Ambulatory Visit | Attending: Gastroenterology | Admitting: Gastroenterology

## 2016-12-16 DIAGNOSIS — R1312 Dysphagia, oropharyngeal phase: Secondary | ICD-10-CM | POA: Diagnosis present

## 2016-12-16 DIAGNOSIS — R1319 Other dysphagia: Secondary | ICD-10-CM

## 2016-12-21 ENCOUNTER — Encounter (HOSPITAL_COMMUNITY): Payer: Self-pay | Admitting: *Deleted

## 2016-12-21 ENCOUNTER — Emergency Department (HOSPITAL_COMMUNITY)
Admission: EM | Admit: 2016-12-21 | Discharge: 2016-12-21 | Disposition: A | Discharge status: Home / Self Care | Payer: Medicare Other | Attending: Physician Assistant | Admitting: Physician Assistant

## 2016-12-21 DIAGNOSIS — I129 Hypertensive chronic kidney disease with stage 1 through stage 4 chronic kidney disease, or unspecified chronic kidney disease: Secondary | ICD-10-CM | POA: Insufficient documentation

## 2016-12-21 DIAGNOSIS — Z87891 Personal history of nicotine dependence: Secondary | ICD-10-CM | POA: Insufficient documentation

## 2016-12-21 DIAGNOSIS — Z794 Long term (current) use of insulin: Secondary | ICD-10-CM | POA: Diagnosis not present

## 2016-12-21 DIAGNOSIS — Z8673 Personal history of transient ischemic attack (TIA), and cerebral infarction without residual deficits: Secondary | ICD-10-CM | POA: Diagnosis not present

## 2016-12-21 DIAGNOSIS — H938X1 Other specified disorders of right ear: Secondary | ICD-10-CM | POA: Diagnosis present

## 2016-12-21 DIAGNOSIS — H6121 Impacted cerumen, right ear: Secondary | ICD-10-CM | POA: Diagnosis not present

## 2016-12-21 DIAGNOSIS — N183 Chronic kidney disease, stage 3 (moderate): Secondary | ICD-10-CM | POA: Diagnosis not present

## 2016-12-21 DIAGNOSIS — Z7982 Long term (current) use of aspirin: Secondary | ICD-10-CM | POA: Diagnosis not present

## 2016-12-21 MED ORDER — CARBAMIDE PEROXIDE 6.5 % OT SOLN
5.0000 [drp] | Freq: Once | OTIC | Status: AC
  Administered 2016-12-21: 5 [drp] via OTIC
  Filled 2016-12-21: qty 15

## 2016-12-21 NOTE — ED Triage Notes (Signed)
PT reports RT ear is clogged up and hard to hear out of ear.

## 2016-12-21 NOTE — Discharge Instructions (Signed)
Use Debrox, 5 drops in your right ear twice a day for 5 days, to soften your earwax. Avoid using a Q-tip. Follow up with ENT if symptoms persist.

## 2016-12-21 NOTE — ED Provider Notes (Signed)
[facility] DEPT Provider Note   CSN: [id number] Arrival date & time: 12/21/16  0747     History   Chief Complaint Chief Complaint  Patient presents with  . Ear Fullness    HPI Anna [name] is a 71 y.o. female.  71 year old female with a history of cerumen impaction presents to the emergency department for decreased hearing in her right ear. She has noticed symptoms over the past week. She has tried applying sweet oil to her ear which has expelled a small amount of wax, but symptoms have persisted. No ear pain, bleeding, or fevers. Patient has been told in the past that she has narrow ear canals contributing to her wax buildup.    The history is provided by the patient. No language interpreter was used.  Ear Fullness  This is a new problem. Episode onset: 1 week ago. The problem occurs constantly. The problem has been gradually worsening. Nothing relieves the symptoms. Treatments tried: Sweet oil in ear canal. The treatment provided no relief.    Past Medical History:  Diagnosis Date  . Arthritis   . Blood transfusion without reported diagnosis    40 plus yrs ago  . Cataract   . CKD (chronic kidney disease), stage III 08/10/2014  . Diabetes mellitus   . GERD (gastroesophageal reflux disease)   . Glaucoma   . Hyperlipidemia   . Hypertension     Patient Active Problem List   Diagnosis Date Noted  . Plantar warts 06/17/2016  . Benign essential HTN   . Cranial nerve VI palsy   . 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 Diplopia 11/20/2015  . Abdominal pain, epigastric 03/02/2015  . TIA (transient ischemic attack) 10/25/2014  . CKD (chronic kidney disease), stage III 08/10/2014  . HLD (hyperlipidemia) 06/12/2014  . Right shoulder pain 06/12/2014  . Depression 12/03/2012  . History of tobacco use 06/09/2009  . VAGINITIS, ATROPHIC 10/25/2008  . COLONIC POLYPS 01/21/2007  . Diabetes mellitus with diabetic neuropathy, with long-term current  use of insulin ([place]) 11/19/2006  . OBESITY, NOS 11/19/2006  . GLAUCOMA 11/19/2006  . CATARACT 11/19/2006  . HYPERTENSION, BENIGN SYSTEMIC 11/19/2006  . DIVERTICULOSIS OF COLON 11/19/2006  . PROTEINURIA 11/19/2006    Past Surgical History:  Procedure Laterality Date  . ABDOMINAL HYSTERECTOMY    . COLONOSCOPY    . POLYPECTOMY      OB History    No data available       Home Medications    Prior to Admission medications   Medication Sig Start Date End Date Taking? Authorizing Provider  amLODipine (NORVASC) 5 MG tablet Take 1 tablet (5 mg total) by mouth daily. 11/27/14 12/28/15  Olam Idler, MD  aspirin 81 MG tablet Take 1 tablet (81 mg total) by mouth daily. 11/27/14   Olam Idler, MD  atorvastatin (LIPITOR) 40 MG tablet TAKE ONE TABLET BY MOUTH ONCE DAILY 10/31/16   Marjie Skiff, MD  Blood Glucose Monitoring Suppl (ONE TOUCH ULTRA 2) w/Device KIT 1 Device by Does not apply route once. 11/29/15   Olam Idler, MD  canagliflozin (INVOKANA) 100 MG TABS tablet Take 1 tablet (100 mg total) by mouth daily. 11/27/16   Marjie Skiff, MD  cyclobenzaprine (FLEXERIL) 10 MG tablet Take 1 tablet (10 mg total) by mouth 2 (two) times daily as needed for muscle spasms. 07/09/16   Domenic Moras, PA-C  glucose blood (ONE TOUCH ULTRA TEST) test strip Check sugar 3-4 x  daily as needed 11/29/15   Olam Idler, MD  HYDROmorphone (DILAUDID) 4 MG tablet Take 1 tablet (4 mg total) by mouth every 4 (four) hours as needed for severe pain. 07/12/16   Leonard Schwartz, MD  Lancets [facility] ULTRASOFT) lancets Check blood sugar 3-4 times daily as needed 11/29/15   Olam Idler, MD  LANTUS 100 UNIT/ML injection INJECT 60 UNITS SUBCUTANEOUSLY ONCE DAILY 12/18/15   Olam Idler, MD  lidocaine (LIDODERM) 5 % Place 1 patch onto the skin daily. Remove & Discard patch within 12 hours or as directed by MD 10/20/16   Margette Fast, MD  metoprolol succinate (TOPROL XL) 25 MG 24 hr tablet Take 1 tablet (25 mg total) by mouth  daily. 11/27/14 12/14/15  Olam Idler, MD  pantoprazole (PROTONIX) 40 MG tablet Take 1 tablet (40 mg total) by mouth daily. 06/06/15   Olam Idler, MD  quinapril-hydrochlorothiazide (ACCURETIC) 20-25 MG tablet Take 1 tablet by mouth daily. 08/26/16   Marjie Skiff, MD  sertraline (ZOLOFT) 50 MG tablet Take 1 tablet (50 mg total) by mouth daily. 11/27/14   Olam Idler, MD    Family History Family History  Problem Relation Age of Onset  . Diabetes Daughter   . Hyperlipidemia Mother   . Diabetes Mother   . Cancer Brother   . Hyperlipidemia Brother   . Colon cancer Neg Hx   . Colon polyps Neg Hx   . Rectal cancer Neg Hx   . Stomach cancer Neg Hx   . Esophageal cancer Neg Hx     Social History Social History  Substance Use Topics  . Smoking status: Former Smoker    Years: 30.00    Types: Cigarettes    Quit date: 10/09/2014  . Smokeless tobacco: Never Used     Comment: 1 cig a week  . Alcohol use 0.6 oz/week    1 Cans of beer per week     Comment: occasional beer     Allergies   Metformin   Review of Systems Review of Systems Ten systems reviewed and are negative for acute change, except as noted in the HPI.    Physical Exam Updated Vital Signs BP (!) 186/75 (BP Location: Left Arm)   Pulse 83   Temp 98.4 F (36.9 C) (Oral)   Resp 20   Ht '5\' 1"'  (1.549 m)   Wt 74.8 kg   SpO2 98%   BMI 31.18 kg/m   Physical Exam  Constitutional: She is oriented to person, place, and time. She appears well-developed and well-nourished. No distress.  Nontoxic and in NAD  HENT:  Head: Normocephalic and atraumatic.  Right Ear: External ear normal.  Left Ear: Tympanic membrane, external ear and ear canal normal.  Cerumen in right ear canal obstructing view of TM. Mild cerumen in left ear canal. Left TM normal.  Eyes: Conjunctivae and EOM are normal. No scleral icterus.  Neck: Normal range of motion.  Pulmonary/Chest: Effort normal. No respiratory distress.  Respirations even  and unlabored  Musculoskeletal: Normal range of motion.  Neurological: She is alert and oriented to person, place, and time. She exhibits normal muscle tone. Coordination normal.  GCS 15. Ambulatory with steady gait.  Skin: Skin is warm and dry. No rash noted. She is not diaphoretic. No erythema. No pallor.  Psychiatric: She has a normal mood and affect. Her behavior is normal.  Nursing note and vitals reviewed.    ED Treatments / Results  Labs (all  labs ordered are listed, but only abnormal results are displayed) Labs Reviewed - No data to display  EKG  EKG Interpretation None       Radiology No results found.  Procedures .Ear Cerumen Removal Date/Time: 12/21/2016 8:41 AM Performed by: Antonietta Breach Authorized by: Antonietta Breach   Consent:    Consent obtained:  Verbal and emergent situation   Consent given by:  Patient   Risks discussed:  Dizziness and incomplete removal   Alternatives discussed:  No treatment Procedure details:    Location:  R ear   Procedure type: irrigation   Post-procedure details:    Inspection:  TM intact   Hearing quality:  Improved   Patient tolerance of procedure:  Tolerated well, no immediate complications   (including critical care time)  Medications Ordered in ED Medications  carbamide peroxide (DEBROX) 6.5 % otic solution 5 drop (5 drops Right Ear Given 12/21/16 0805)     Initial Impression / Assessment and Plan / ED Course  I have reviewed the triage vital signs and the nursing notes.  Pertinent labs & imaging results that were available during my care of the patient were reviewed by me and considered in my medical decision making (see chart for details).     Patient presents to the emergency department for decreased hearing in her right ear secondary to cerumen impaction. Ear irrigated with improvement in hearing. Majority of cerumen removed. Patient advised to continue Debrox at home. Will refer to ENT for persistent symptoms.  Return precautions discussed and provided. Patient discharged in stable condition with no unaddressed concerns.   Final Clinical Impressions(s) / ED Diagnoses   Final diagnoses:  Impacted cerumen of right ear    New Prescriptions New Prescriptions   No medications on file     Antonietta Breach, PA-C 12/21/16 [place], MD 12/22/16 [phone]

## 2016-12-21 NOTE — ED Notes (Signed)
Declined W/C at D/C and was escorted to lobby by RN. 

## 2017-01-14 ENCOUNTER — Other Ambulatory Visit: Payer: Self-pay | Admitting: Family Medicine

## 2017-01-30 ENCOUNTER — Ambulatory Visit: Payer: Medicare Other | Admitting: Family Medicine

## 2017-02-24 ENCOUNTER — Ambulatory Visit (INDEPENDENT_AMBULATORY_CARE_PROVIDER_SITE_OTHER): Payer: Medicare Other | Admitting: Family Medicine

## 2017-02-24 ENCOUNTER — Encounter: Payer: Self-pay | Admitting: Family Medicine

## 2017-02-24 VITALS — BP 110/70 | HR 78 | Temp 97.8°F | Ht 61.0 in | Wt 164.0 lb

## 2017-02-24 DIAGNOSIS — R1013 Epigastric pain: Secondary | ICD-10-CM

## 2017-02-24 DIAGNOSIS — E1140 Type 2 diabetes mellitus with diabetic neuropathy, unspecified: Secondary | ICD-10-CM | POA: Diagnosis not present

## 2017-02-24 DIAGNOSIS — Z794 Long term (current) use of insulin: Secondary | ICD-10-CM | POA: Diagnosis not present

## 2017-02-24 DIAGNOSIS — E119 Type 2 diabetes mellitus without complications: Secondary | ICD-10-CM | POA: Diagnosis not present

## 2017-02-24 DIAGNOSIS — I10 Essential (primary) hypertension: Secondary | ICD-10-CM | POA: Diagnosis not present

## 2017-02-24 LAB — POCT GLYCOSYLATED HEMOGLOBIN (HGB A1C): HEMOGLOBIN A1C: 8.3

## 2017-02-24 MED ORDER — QUINAPRIL-HYDROCHLOROTHIAZIDE 20-25 MG PO TABS: 1.0000 | ORAL_TABLET | Freq: Every day | ORAL | 3 refills | Status: DC

## 2017-02-24 MED ORDER — ONETOUCH ULTRASOFT LANCETS MISC: 12 refills | Status: DC

## 2017-02-24 MED ORDER — ASPIRIN 81 MG PO TABS: 81.0000 mg | ORAL_TABLET | Freq: Every day | ORAL | 11 refills | Status: AC

## 2017-02-24 MED ORDER — PANTOPRAZOLE SODIUM 40 MG PO TBEC: 40.0000 mg | DELAYED_RELEASE_TABLET | Freq: Every day | ORAL | 3 refills | Status: DC

## 2017-02-24 MED ORDER — GLUCOSE BLOOD VI STRP: ORAL_STRIP | 3 refills | Status: AC

## 2017-02-24 MED ORDER — CANAGLIFLOZIN 100 MG PO TABS: 100.0000 mg | ORAL_TABLET | Freq: Every day | ORAL | 3 refills | Status: DC

## 2017-02-24 MED ORDER — INSULIN GLARGINE 100 UNIT/ML ~~LOC~~ SOLN: SUBCUTANEOUS | 6 refills | Status: DC

## 2017-02-24 MED ORDER — INSULIN PEN NEEDLE 31G X 5 MM MISC: 5 refills | Status: AC

## 2017-02-24 MED ORDER — ATORVASTATIN CALCIUM 40 MG PO TABS: 40.0000 mg | ORAL_TABLET | Freq: Every day | ORAL | 1 refills | Status: DC

## 2017-02-24 NOTE — Patient Instructions (Signed)
It was great seeing you today! We have addressed the following issues today  1. I will increase your Invokana to 300 mg from 100 mg. Will check your A1c at our next visit in September. 2. Make those changes with talked about in clinic about your diet 3. I will refill all the medications and strips   If we did any lab work today, and the results require attention, either me or my nurse will get in touch with you. If everything is normal, you will get a letter in mail and a message via . If you don't hear from [name] in two weeks, please give [name] a call. Otherwise, we look forward to seeing you again at your next visit. If you have any questions or concerns before then, please call the clinic at [phone].  Please bring all your medications to every doctors visit  Sign up for My Chart to have easy access to your labs results, and communication with your Primary care physician. Please ask Front Desk for some assistance.   Please check-out at the front desk before leaving the clinic.    Take Care,   Dr. Andy Gauss

## 2017-02-25 ENCOUNTER — Other Ambulatory Visit: Payer: Self-pay | Admitting: *Deleted

## 2017-02-25 MED ORDER — ONETOUCH DELICA LANCETS 33G MISC: 5 refills | Status: AC

## 2017-02-26 NOTE — Progress Notes (Signed)
   Subjective:    Patient ID: Anna [name], female    DOB: 06/27/1946, 71 y.o.   MRN: [id number]   CC: A1c check and med refill  HPI: Patient is a 71 yo female with a past medical history significant for HTN, DMII,  TIA, and CKDIII who presents today to clinic for follow on her diabetes.  T2DM: Patient reports some dietary indiscretions for the past few weeks. Patient is here to follow up on A1c no other complaints.Patient reports that she has a appointment schedule in august with Dr.Shah her ophthalmologist for her annual eye exam.  Smoking status reviewed   ROS: all other systems were reviewed and are negative other than in the HPI  Past Medical History:  Diagnosis Date  . Arthritis   . Blood transfusion without reported diagnosis    40 plus yrs ago  . Cataract   . CKD (chronic kidney disease), stage III 08/10/2014  . Diabetes mellitus   . GERD (gastroesophageal reflux disease)   . Glaucoma   . Hyperlipidemia   . Hypertension    Past Surgical History:  Procedure Laterality Date  . ABDOMINAL HYSTERECTOMY    . COLONOSCOPY    . POLYPECTOMY      Objective:  BP 110/70   Pulse 78   Temp 97.8 F (36.6 C) (Oral)   Ht 5\' 1"  (1.549 m)   Wt 164 lb (74.4 kg)   SpO2 98%   BMI 30.99 kg/m   Vitals and nursing note reviewed  General: NAD, pleasant, able to participate in exam Cardiac: RRR, normal heart sounds, no murmurs. 2+ radial and PT pulses bilaterally Respiratory: CTAB, normal effort, No wheezes, rales or rhonchi Abdomen: soft, nontender, nondistended, no hepatic or splenomegaly, +BS Extremities: no edema or cyanosis. WWP. Skin: warm and dry, no rashes noted Neuro: alert and oriented x4, no focal deficits Psych: Normal affect and mood   Assessment & Plan:   #T2DM, uncontrolled A1c is 8.3 significantly higher than last A1c in January of 7.3 . Patient alluded to poor control in the past few weeks to months which is reflected in her A1c. Patient is motivated to  make changes to her current diet and exercise plan. Patient decline diabetic nutritrionist referral and would like to do it on her own. --Will increase Canaglifozin from 100 mg to 300 mg  --Continue Lantus 60 units once daily --Will see patient in 3 months to assess progress   #All medications have been refilled per patient request    Marjie Skiff, MD Family Medicine Resident PGY-1

## 2017-03-03 ENCOUNTER — Telehealth: Payer: Self-pay | Admitting: Family Medicine

## 2017-03-03 NOTE — Telephone Encounter (Signed)
Anna [name],  Please let patient know that I want her to take 300 mg. I just reorder her previous meds which was 100 mg daily so she can take 3 pills right now and next time, I will order the 300 mg tabs for her.  Marjie Skiff, PGY-1

## 2017-03-03 NOTE — Telephone Encounter (Signed)
LM for patient to call back. Anna [name],CMA  

## 2017-03-03 NOTE — Telephone Encounter (Signed)
Will forward to MD to advise. Jazmin Hartsell,CMA  

## 2017-03-03 NOTE — Telephone Encounter (Signed)
Pt is confused by dr instructions about canagliflozin.  She understood she was to take 300mg  per day but the new Rx shows 100mg  per day. Also dr did not discuss changing to [facility].  Please advise

## 2017-03-04 ENCOUNTER — Other Ambulatory Visit: Payer: Self-pay | Admitting: Family Medicine

## 2017-03-04 MED ORDER — CANAGLIFLOZIN 300 MG PO TABS: 300.0000 mg | ORAL_TABLET | Freq: Every day | ORAL | 1 refills | Status: DC

## 2017-03-04 NOTE — Telephone Encounter (Signed)
Patient states that she would like to go ahead and get the new script to reflect the 300mg  invokana dose since she will only have enough for 2 weeks of less of the 100mg  since she is taking 3 at a time.  Patient also states that her pharmacy gave her 3 bottles of atorvastatin with 90 tablets in the each.  I explained to her that Is not what the prescription asked them to do and she would have to call her pharmacy and discuss it with them.  Patient voiced understanding and will send script refill to MD to authorize. Jazmin Hartsell,CMA

## 2017-03-04 NOTE — Telephone Encounter (Signed)
Pt states she would like Jazmin to call her back, pt forgot to ask something (pt did not want to say) about Rx. ep

## 2017-03-04 NOTE — Telephone Encounter (Signed)
Medication sent to pharmacy ok per Dr. Andy Gauss. Saiya Crist,CMA

## 2017-03-04 NOTE — Telephone Encounter (Signed)
On 6/5 I reordered for her Invokana 30 tabs with 3 refills which should have lasted her 3 months or so. When she runs out of this prescription I will write her a new one with 300 mg tabs. If for some reason she has a problem with taking three pills, I will do it now. I am trying to avoid "wasting" those 100 mg tabs.  Marjie Skiff PGY-1

## 2017-03-04 NOTE — Addendum Note (Signed)
Addended by: Valerie Roys on: 03/04/2017 01:16 PM   Modules accepted: Orders

## 2017-03-04 NOTE — Telephone Encounter (Signed)
Clarified medication with patient. [name],CMA

## 2017-03-04 NOTE — Addendum Note (Signed)
Addended by: Valerie Roys on: 03/04/2017 11:47 AM   Modules accepted: Orders

## 2017-04-01 ENCOUNTER — Other Ambulatory Visit: Payer: Self-pay | Admitting: Family Medicine

## 2017-04-01 DIAGNOSIS — Z1231 Encounter for screening mammogram for malignant neoplasm of breast: Secondary | ICD-10-CM

## 2017-04-13 ENCOUNTER — Other Ambulatory Visit: Payer: Self-pay | Admitting: *Deleted

## 2017-04-13 DIAGNOSIS — I10 Essential (primary) hypertension: Secondary | ICD-10-CM

## 2017-04-13 MED ORDER — AMLODIPINE BESYLATE 5 MG PO TABS: 5.0000 mg | ORAL_TABLET | Freq: Every day | ORAL | 3 refills | Status: DC

## 2017-04-13 NOTE — Addendum Note (Signed)
Addended by: Valerie Roys on: 04/13/2017 01:51 PM   Modules accepted: Orders

## 2017-04-13 NOTE — Telephone Encounter (Signed)
Script printed instead of being sent electronically. Saloma Cadena,CMA

## 2017-04-23 ENCOUNTER — Ambulatory Visit: Payer: Medicare Other

## 2017-05-07 ENCOUNTER — Ambulatory Visit
Admission: RE | Admit: 2017-05-07 | Discharge: 2017-05-07 | Disposition: A | Discharge status: Home / Self Care | Payer: Medicare Other | Source: Ambulatory Visit | Attending: Family Medicine | Admitting: Family Medicine

## 2017-05-07 DIAGNOSIS — Z1231 Encounter for screening mammogram for malignant neoplasm of breast: Secondary | ICD-10-CM

## 2017-07-16 ENCOUNTER — Ambulatory Visit (INDEPENDENT_AMBULATORY_CARE_PROVIDER_SITE_OTHER): Payer: Medicare Other | Admitting: Family Medicine

## 2017-07-16 ENCOUNTER — Encounter: Payer: Self-pay | Admitting: Family Medicine

## 2017-07-16 VITALS — BP 130/72 | HR 61 | Temp 98.9°F | Ht 61.0 in | Wt 169.0 lb

## 2017-07-16 DIAGNOSIS — Z23 Encounter for immunization: Secondary | ICD-10-CM | POA: Diagnosis not present

## 2017-07-16 DIAGNOSIS — E1140 Type 2 diabetes mellitus with diabetic neuropathy, unspecified: Secondary | ICD-10-CM | POA: Diagnosis not present

## 2017-07-16 DIAGNOSIS — Z794 Long term (current) use of insulin: Secondary | ICD-10-CM

## 2017-07-16 DIAGNOSIS — R1013 Epigastric pain: Secondary | ICD-10-CM

## 2017-07-16 LAB — POCT GLYCOSYLATED HEMOGLOBIN (HGB A1C): Hemoglobin A1C: 7.1

## 2017-07-16 MED ORDER — PANTOPRAZOLE SODIUM 40 MG PO TBEC: 40.0000 mg | DELAYED_RELEASE_TABLET | Freq: Every day | ORAL | 1 refills | Status: DC

## 2017-07-16 NOTE — Patient Instructions (Signed)
It was great seeing you today! We have addressed the following issues today  1. Your A1c is much improved. Keep up the good work  2. I refilled you reflux medication, I will see you in about three months or earlier as needed.  If we did any lab work today, and the results require attention, either me or my nurse will get in touch with you. If everything is normal, you will get a letter in mail and a message via . If you don't hear from [name] in two weeks, please give [name] a call. Otherwise, we look forward to seeing you again at your next visit. If you have any questions or concerns before then, please call the clinic at [phone].  Please bring all your medications to every doctors visit  Sign up for My Chart to have easy access to your labs results, and communication with your Primary care physician. Please ask Front Desk for some assistance.   Please check-out at the front desk before leaving the clinic.    Take Care,   Dr. Andy Gauss

## 2017-07-16 NOTE — Progress Notes (Signed)
   Subjective:    Patient ID: Anna [name], female    DOB: 16-Sep-1946, 71 y.o.   MRN: [id number]   CC: Diabetes follow up   HPI: Patient is a 70 yo female here for diabetes follow up. Patient reports that she has been doing well since last visit. She reports her diet has not been optimal for a diabetic patient as she is eating a lot of starch as in potatoes, rice and pasta. Patient report that  She is limited financially this is the type of foot she can afford. She try to walk and increase her physical activity. Patient is not confident that her A1c will be better today. Patient has been adherent with her Invokana and insulin. Patient denies any chest pain, abdominal pain, shortness of breath, polyuria, polydipsia.   Smoking status reviewed   ROS: all other systems were reviewed and are negative other than in the HPI   Past Medical History:  Diagnosis Date  . Arthritis   . Blood transfusion without reported diagnosis    40 plus yrs ago  . Cataract   . CKD (chronic kidney disease), stage III ([place]) 08/10/2014  . Diabetes mellitus   . GERD (gastroesophageal reflux disease)   . Glaucoma   . Hyperlipidemia   . Hypertension     Past Surgical History:  Procedure Laterality Date  . ABDOMINAL HYSTERECTOMY    . COLONOSCOPY    . POLYPECTOMY      Past medical history, surgical, family, and social history reviewed and updated in the EMR as appropriate.  Objective:  BP 130/72   Pulse 61   Temp 98.9 F (37.2 C) (Oral)   Ht 5\' 1"  (1.549 m)   Wt 169 lb (76.7 kg)   SpO2 99%   BMI 31.93 kg/m   Vitals and nursing note reviewed  General: NAD, pleasant, able to participate in exam Cardiac: RRR, normal heart sounds, no murmurs. 2+ radial and PT pulses bilaterally Respiratory: CTAB, normal effort, No wheezes, rales or rhonchi Abdomen: soft, nontender, nondistended, no hepatic or splenomegaly, +BS Extremities: no edema or cyanosis. WWP. Diabetic foot exam performed, good sensation  bilaterally, no lesions noted. Skin: warm and dry, no rashes noted Neuro: alert and oriented x4, no focal deficits Psych: Normal affect and mood   Assessment & Plan:   #T2DM follow up A1c is 7.1 down from 8.3 much improved. Will continue with current regimen. Discuss with patient changes in diet and exercise in order to further improved glycemic control. --Continue Invokana 300 mg po --Continue Lantus 60 U daily  Marjie Skiff, MD [place] [facility] PGY-2

## 2017-09-30 ENCOUNTER — Other Ambulatory Visit: Payer: Self-pay | Admitting: Family Medicine

## 2017-09-30 DIAGNOSIS — E1140 Type 2 diabetes mellitus with diabetic neuropathy, unspecified: Secondary | ICD-10-CM

## 2017-09-30 DIAGNOSIS — Z794 Long term (current) use of insulin: Principal | ICD-10-CM

## 2017-09-30 MED ORDER — ATORVASTATIN CALCIUM 40 MG PO TABS: 40.0000 mg | ORAL_TABLET | Freq: Every day | ORAL | 1 refills | Status: DC

## 2017-09-30 MED ORDER — ATORVASTATIN CALCIUM 40 MG PO TABS: 40.0000 mg | ORAL_TABLET | Freq: Every day | ORAL | 0 refills | Status: AC

## 2017-11-23 ENCOUNTER — Ambulatory Visit (INDEPENDENT_AMBULATORY_CARE_PROVIDER_SITE_OTHER): Payer: Medicare Other | Admitting: Family Medicine

## 2017-11-23 ENCOUNTER — Other Ambulatory Visit: Payer: Self-pay

## 2017-11-23 ENCOUNTER — Encounter: Payer: Self-pay | Admitting: Family Medicine

## 2017-11-23 VITALS — BP 114/60 | HR 92 | Temp 98.0°F | Ht 61.0 in | Wt 178.0 lb

## 2017-11-23 DIAGNOSIS — Z794 Long term (current) use of insulin: Secondary | ICD-10-CM | POA: Diagnosis not present

## 2017-11-23 DIAGNOSIS — E1140 Type 2 diabetes mellitus with diabetic neuropathy, unspecified: Secondary | ICD-10-CM | POA: Diagnosis not present

## 2017-11-23 LAB — POCT GLYCOSYLATED HEMOGLOBIN (HGB A1C): HEMOGLOBIN A1C: 12

## 2017-11-23 NOTE — Patient Instructions (Signed)
It was great seeing you today! We have addressed the following issues today  1. Increase Lantus to 70 u daily from 60. For the next week check your blood glucose in the morning before you et breakfast, 2 hours after lunch and 2 hours after dinner. 2. I will refer you to the Diabetic Education Class. 3. I will see you in two months or sooner. 4. Please work on making changes to your diet and try to exercise.  If we did any lab work today, and the results require attention, either me or my nurse will get in touch with you. If everything is normal, you will get a letter in mail and a message via . If you don't hear from [name] in two weeks, please give [name] a call. Otherwise, we look forward to seeing you again at your next visit. If you have any questions or concerns before then, please call the clinic at [phone].  Please bring all your medications to every doctors visit  Sign up for My Chart to have easy access to your labs results, and communication with your Primary care physician. Please ask Front Desk for some assistance.   Please check-out at the front desk before leaving the clinic.    Take Care,   Dr. Andy Gauss

## 2017-11-23 NOTE — Progress Notes (Signed)
   Subjective:    Patient ID: Anna [name], female    DOB: [birth year], 72 y.o.   MRN: [id number]   CC: Follow up for T2DM  HPI: Patient is a 73 yo female here for diabetes follow up. Patient reports that she has been adherent to her medications Lantus 60 U daily and Invokana. Patient reports for the past few weeks she has had elevated readings on her glucometer in the 200's and 300's. Patient states elevated BG are likely due to her diet which is starch rich but that all she can afford. She has been eating a lot rice, potatoes, pasta and sweet. Patient endorses polyuria and dry mouth. She has been drinking water but not as much. Patient otherwise denies any abdominal pain, nausea, vomiting, burning with urination, chest pain or shortness of breath.   Smoking status reviewed   ROS: all other systems were reviewed and are negative other than in the HPI   Past Medical History:  Diagnosis Date  . Arthritis   . Blood transfusion without reported diagnosis    40 plus yrs ago  . Cataract   . CKD (chronic kidney disease), stage III ([place]) 08/10/2014  . Diabetes mellitus   . GERD (gastroesophageal reflux disease)   . Glaucoma   . Hyperlipidemia   . Hypertension     Past Surgical History:  Procedure Laterality Date  . ABDOMINAL HYSTERECTOMY    . COLONOSCOPY    . POLYPECTOMY      Past medical history, surgical, family, and social history reviewed and updated in the EMR as appropriate.  Objective:  BP 114/60   Pulse 92   Temp 98 F (36.7 C) (Oral)   Ht 5\' 1"  (1.549 m)   Wt 178 lb (80.7 kg)   SpO2 96%   BMI 33.63 kg/m   Vitals and nursing note reviewed  General: NAD, pleasant, able to participate in exam Cardiac: RRR, normal heart sounds, no murmurs. 2+ radial and PT pulses bilaterally Respiratory: CTAB, normal effort, No wheezes, rales or rhonchi Abdomen: soft, nontender, nondistended, no hepatic or splenomegaly, +BS Extremities: no edema or cyanosis. WWP. Skin: warm  and dry, no rashes noted Neuro: alert and oriented x4, no focal deficits Psych: Normal affect and mood   Assessment & Plan:    Diabetes mellitus with diabetic neuropathy, with long-term current use of insulin ([place]) Patient A1c today is 12.0 significantly worse that last check at the end of October when it was 7.1. Patient has been mostly in the mid to low 7's for the past few years. She endorse adherence to current regimen of insulin 60 u daily and invokana. Increase in A1c appear to be related to her diet. She reports carb rich diet ( rice, pasta, potatoes, etc) for the past few month with no exercise. Fasting BG glucose have been in the 200's to 300's. Concern for possible DKA if glycemic control I not improve.  --Increase Lantus to 70u from 60 daily --Will refer to diabetic education for dietary education --Patient will call next week with BG reading for this week so I  Can determine if she need further adjustment. --Encourage exercise and diet    Marjie Skiff, MD [place] PGY-2

## 2017-11-23 NOTE — Assessment & Plan Note (Signed)
Patient A1c today is 12.0 significantly worse that last check at the end of October when it was 7.1. Patient has been mostly in the mid to low 7's for the past few years. She endorse adherence to current regimen of insulin 60 u daily and invokana. Increase in A1c appear to be related to her diet. She reports carb rich diet ( rice, pasta, potatoes, etc) for the past few month with no exercise. Fasting BG glucose have been in the 200's to 300's. Concern for possible DKA if glycemic control I not improve.  --Increase Lantus to 70u from 60 daily --Will refer to diabetic education for dietary education --Patient will call next week with BG reading for this week so I  Can determine if she need further adjustment. --Encourage exercise and diet

## 2017-11-30 ENCOUNTER — Telehealth: Payer: Self-pay

## 2017-11-30 NOTE — Telephone Encounter (Signed)
Pt called nurse line and left message to call back so she can report her blood sugars over the last few days. Returned call, no answer or voicemail. Will attempt to call again later. Wallace Cullens, RN

## 2017-11-30 NOTE — Telephone Encounter (Signed)
Her blood sugar numbers are: 3-5; b-174  L-229   3-6: b-143  L-185  D-181 3-7: B-311  L-138  D-172 3-8: B-145  L-200  D-177 3-9-: B-122 L -166  D-168 3-10: B-110  L 127  D-162 3-11: B-201  Ll-249

## 2017-12-01 NOTE — Telephone Encounter (Signed)
Page,   Please let patient know that I want her to increase her Lantus to 75 u (make sure it split morning and evening) from 70 she is on right now. She can call us back next week and give [name] her BG readings again.  Thanks  Marjie Skiff, MD [place], PGY-2

## 2017-12-03 NOTE — Telephone Encounter (Signed)
Pt contacted and informed of the need to increase her lantus from 70u to 75u. Pt to call us in a week to inform of readings. Pt voiced understanding.

## 2017-12-17 ENCOUNTER — Encounter: Payer: Self-pay | Admitting: Skilled Nursing Facility1

## 2017-12-17 ENCOUNTER — Encounter: Payer: Medicare Other | Attending: Family Medicine | Admitting: Skilled Nursing Facility1

## 2017-12-17 DIAGNOSIS — Z794 Long term (current) use of insulin: Secondary | ICD-10-CM | POA: Diagnosis not present

## 2017-12-17 DIAGNOSIS — Z713 Dietary counseling and surveillance: Secondary | ICD-10-CM | POA: Diagnosis present

## 2017-12-17 DIAGNOSIS — E1140 Type 2 diabetes mellitus with diabetic neuropathy, unspecified: Secondary | ICD-10-CM | POA: Diagnosis not present

## 2017-12-17 DIAGNOSIS — E119 Type 2 diabetes mellitus without complications: Secondary | ICD-10-CM

## 2017-12-17 NOTE — Progress Notes (Signed)
Pts A1C 12. Pt states she got her A1C was 7 in September but now 12.  Pt states she checks her blood sugar fasting (sometimes) in the morning: over 200 and over 300. Pt states she checks her blood sugar 2 hours after lunch and dinner: over 200 or 300.  Pt arrives with her 2 supportive daughters.  Diabetes Self-Management Education  Visit Type: First/Initial  12/17/2017  Anna [name], identified by name and date of birth, is a 72 y.o. female with a diagnosis of Diabetes: Type 2.   ASSESSMENT  Height 5\' 1"  (1.549 m), weight 177 lb 4.8 oz (80.4 kg). Body mass index is 33.5 kg/m.  Diabetes Self-Management Education - 12/17/17 1429      Visit Information   Visit Type  First/Initial      Initial Visit   Diabetes Type  Type 2    Are you currently following a meal plan?  No    Are you taking your medications as prescribed?  Yes      Health Coping   How would you rate your overall health?  Good      Psychosocial Assessment   Patient Belief/Attitude about Diabetes  Motivated to manage diabetes      Pre-Education Assessment   Patient understands the diabetes disease and treatment process.  Needs Review    Patient understands incorporating nutritional management into lifestyle.  Needs Review    Patient undertands incorporating physical activity into lifestyle.  Needs Review    Patient understands using medications safely.  Needs Review    Patient understands monitoring blood glucose, interpreting and using results  Needs Review    Patient understands prevention, detection, and treatment of acute complications.  Needs Review    Patient understands prevention, detection, and treatment of chronic complications.  Needs Review    Patient understands how to develop strategies to address psychosocial issues.  Needs Review    Patient understands how to develop strategies to promote health/change behavior.  Needs Review      Complications   Last HgB A1C per patient/outside source  12  %    How often do you check your blood sugar?  1-2 times/day    Fasting Blood glucose range (mg/dL)  >200    Number of hyperglycemic episodes per week  7    Can you tell when your blood sugar is high?  Yes    What do you do if your blood sugar is high?  nothing    Have you had a dilated eye exam in the past 12 months?  Yes    Have you had a dental exam in the past 12 months?  No    Are you checking your feet?  Yes    How many days per week are you checking your feet?  7      Dietary Intake   Breakfast  grits bacon eggs and toast    Snack (morning)  cheese    Lunch  mashed potaoes and salad    Snack (afternoon)  fruit    Dinner  mashed potatos and beef     Snack (evening)  popcorn    Beverage(s)  water, soda, sugar free lemonade       Exercise   Exercise Type  Light (walking / raking leaves)    How many days per week to you exercise?  3    How many minutes per day do you exercise?  10    Total minutes per week of exercise  30      Patient Education   Previous Diabetes Education  Yes (please comment)    Disease state   Definition of diabetes, type 1 and 2, and the diagnosis of diabetes;Factors that contribute to the development of diabetes    Nutrition management   Role of diet in the treatment of diabetes and the relationship between the three main macronutrients and blood glucose level;Food label reading, portion sizes and measuring food.;Meal options for control of blood glucose level and chronic complications.;Carbohydrate counting    Physical activity and exercise   Role of exercise on diabetes management, blood pressure control and cardiac health.;Identified with patient nutritional and/or medication changes necessary with exercise.    Monitoring  Taught/evaluated SMBG meter.;Purpose and frequency of SMBG.;Yearly dilated eye exam;Daily foot exams    Chronic complications  Relationship between chronic complications and blood glucose control;Assessed and discussed foot care and  prevention of foot problems;Retinopathy and reason for yearly dilated eye exams;Dental care;Nephropathy, what it is, prevention of, the use of ACE, ARB's and early detection of through urine microalbumia.    Psychosocial adjustment  Role of stress on diabetes      Individualized Goals (developed by patient)   Nutrition  Follow meal plan discussed;General guidelines for healthy choices and portions discussed;Adjust meds/carbs with exercise as discussed    Physical Activity  15 minutes per day;Exercise 5-7 days per week      Post-Education Assessment   Patient understands the diabetes disease and treatment process.  Demonstrates understanding / competency    Patient understands incorporating nutritional management into lifestyle.  Demonstrates understanding / competency    Patient undertands incorporating physical activity into lifestyle.  Demonstrates understanding / competency    Patient understands using medications safely.  Demonstrates understanding / competency    Patient understands monitoring blood glucose, interpreting and using results  Demonstrates understanding / competency    Patient understands prevention, detection, and treatment of acute complications.  Demonstrates understanding / competency    Patient understands prevention, detection, and treatment of chronic complications.  Demonstrates understanding / competency    Patient understands how to develop strategies to address psychosocial issues.  Demonstrates understanding / competency    Patient understands how to develop strategies to promote health/change behavior.  Demonstrates understanding / competency      Outcomes   Expected Outcomes  Demonstrated interest in learning. Expect positive outcomes    Future DMSE  PRN    Program Status  Completed       Individualized Plan for Diabetes Self-Management Training:   Learning Objective:  Patient will have a greater understanding of diabetes self-management. Patient education  plan is to attend individual and/or group sessions per assessed needs and concerns.   Plan:   There are no Patient Instructions on file for this visit.  Expected Outcomes:  Demonstrated interest in learning. Expect positive outcomes  Education material provided: Living Well with Diabetes, Meal plan card and My Plate  If problems or questions, patient to contact team via:  Phone  Future DSME appointment: PRN

## 2017-12-25 ENCOUNTER — Ambulatory Visit: Payer: Medicare Other | Admitting: Family Medicine

## 2018-01-25 ENCOUNTER — Other Ambulatory Visit: Payer: Self-pay | Admitting: Family Medicine

## 2018-01-25 ENCOUNTER — Other Ambulatory Visit: Payer: Self-pay | Admitting: *Deleted

## 2018-01-25 DIAGNOSIS — E119 Type 2 diabetes mellitus without complications: Secondary | ICD-10-CM

## 2018-01-25 DIAGNOSIS — Z794 Long term (current) use of insulin: Secondary | ICD-10-CM

## 2018-01-25 DIAGNOSIS — R1013 Epigastric pain: Secondary | ICD-10-CM

## 2018-01-25 MED ORDER — PANTOPRAZOLE SODIUM 40 MG PO TBEC: 40.0000 mg | DELAYED_RELEASE_TABLET | Freq: Every day | ORAL | 1 refills | Status: DC

## 2018-01-25 MED ORDER — CANAGLIFLOZIN 100 MG PO TABS: 100.0000 mg | ORAL_TABLET | Freq: Every day | ORAL | 3 refills | Status: DC

## 2018-01-27 ENCOUNTER — Ambulatory Visit: Payer: Medicare Other | Admitting: Family Medicine

## 2018-03-08 ENCOUNTER — Ambulatory Visit: Payer: Medicare Other | Admitting: Family Medicine

## 2018-03-08 ENCOUNTER — Ambulatory Visit (HOSPITAL_COMMUNITY)
Admission: EM | Admit: 2018-03-08 | Discharge: 2018-03-08 | Disposition: A | Discharge status: Home / Self Care | Payer: Medicare Other | Attending: Internal Medicine | Admitting: Internal Medicine

## 2018-03-08 ENCOUNTER — Encounter (HOSPITAL_COMMUNITY): Payer: Self-pay | Admitting: Family Medicine

## 2018-03-08 DIAGNOSIS — H1031 Unspecified acute conjunctivitis, right eye: Secondary | ICD-10-CM | POA: Diagnosis not present

## 2018-03-08 MED ORDER — FLUORESCEIN SODIUM 1 MG OP STRP
ORAL_STRIP | OPHTHALMIC | Status: AC
  Filled 2018-03-08: qty 1

## 2018-03-08 MED ORDER — TETRACAINE HCL 0.5 % OP SOLN
OPHTHALMIC | Status: AC
  Filled 2018-03-08: qty 4

## 2018-03-08 NOTE — ED Provider Notes (Signed)
[place]    CSN: [id number] Arrival date & time: 03/08/18  0957     History   Chief Complaint Chief Complaint  Patient presents with  . Eye Problem    HPI Anna [name] is a 72 y.o. female.   72 year old female with history of diabetes, glaucoma, HLD, HTN comes in for 1 week history of right eye irritation, drainage.  State right eye is also running, and itching.  Dry crusting in the morning.  Occasional foreign body sensation. States some left eye symptoms, but not as severe, not as constant.  She started on Restasis 2 weeks ago, and has been using it twice a day.  When symptoms first started, she was told to add on artificial teardrops every hour.  Denies vision changes.  Has some photophobia first thing in the morning.  Denies URI symptoms such as cough, congestion, sore throat.  Denies fever, chills, night sweats.  She has a history of glaucoma surgery to both eyes, performed by Dr. Hillery Hunter at [facility].      Past Medical History:  Diagnosis Date  . Arthritis   . Blood transfusion without reported diagnosis    40 plus yrs ago  . Cataract   . CKD (chronic kidney disease), stage III ([place]) 08/10/2014  . Diabetes mellitus   . GERD (gastroesophageal reflux disease)   . Glaucoma   . Hyperlipidemia   . Hypertension     Patient Active Problem List   Diagnosis Date Noted  . Plantar warts 06/17/2016  . Benign essential HTN   . Cranial nerve VI palsy   . 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 Diplopia 11/20/2015  . Abdominal pain, epigastric 03/02/2015  . TIA (transient ischemic attack) 10/25/2014  . CKD (chronic kidney disease), stage III ([place]) 08/10/2014  . HLD (hyperlipidemia) 06/12/2014  . Right shoulder pain 06/12/2014  . Depression 12/03/2012  . History of tobacco use 06/09/2009  . VAGINITIS, ATROPHIC 10/25/2008  . COLONIC POLYPS 01/21/2007  . Diabetes mellitus with diabetic neuropathy, with long-term current  use of insulin ([place]) 11/19/2006  . OBESITY, NOS 11/19/2006  . GLAUCOMA 11/19/2006  . CATARACT 11/19/2006  . HYPERTENSION, BENIGN SYSTEMIC 11/19/2006  . DIVERTICULOSIS OF COLON 11/19/2006  . PROTEINURIA 11/19/2006    Past Surgical History:  Procedure Laterality Date  . ABDOMINAL HYSTERECTOMY    . COLONOSCOPY    . POLYPECTOMY      OB History   None      Home Medications    Prior to Admission medications   Medication Sig Start Date End Date Taking? Authorizing Provider  amLODipine (NORVASC) 5 MG tablet Take 1 tablet (5 mg total) by mouth daily. 04/13/17 05/14/18  Marjie Skiff, MD  aspirin 81 MG tablet Take 1 tablet (81 mg total) by mouth daily. 02/24/17   Diallo, Earna Coder, MD  atorvastatin (LIPITOR) 40 MG tablet Take 1 tablet (40 mg total) by mouth daily. 09/30/17   Diallo, Earna Coder, MD  Blood Glucose Monitoring Suppl (ONE TOUCH ULTRA 2) w/Device KIT 1 Device by Does not apply route once. 11/29/15   Olam Idler, MD  canagliflozin (INVOKANA) 100 MG TABS tablet Take 1 tablet (100 mg total) by mouth daily. 01/25/18   Diallo, Earna Coder, MD  canagliflozin (INVOKANA) 300 MG TABS tablet Take 1 tablet (300 mg total) by mouth daily before breakfast. 03/04/17   Diallo, Earna Coder, MD  cyclobenzaprine (FLEXERIL) 10 MG tablet Take 1 tablet (10 mg total) by mouth  2 (two) times daily as needed for muscle spasms. 07/09/16   Domenic Moras, PA-C  glucose blood (ONE TOUCH ULTRA TEST) test strip Check sugar 3-4 x daily as needed 02/24/17   Diallo, Earna Coder, MD  HYDROmorphone (DILAUDID) 4 MG tablet Take 1 tablet (4 mg total) by mouth every 4 (four) hours as needed for severe pain. 07/12/16   Leonard Schwartz, MD  insulin glargine (LANTUS) 100 UNIT/ML injection INJECT 60 UNITS SUBCUTANEOUSLY ONCE DAILY 02/24/17   Diallo, Abdoulaye, MD  Insulin Pen Needle (B-D UF III MINI PEN NEEDLES) 31G X 5 MM MISC Use with Solostar Lantus pen needles 02/24/17   Diallo, Abdoulaye, MD  lidocaine (LIDODERM) 5 % Place 1 patch onto  the skin daily. Remove & Discard patch within 12 hours or as directed by MD 10/20/16   Long, Wonda Olds, MD  metoprolol succinate (TOPROL XL) 25 MG 24 hr tablet Take 1 tablet (25 mg total) by mouth daily. 11/27/14 12/14/15  Olam Idler, MD  [facility] DELICA LANCETS 39Q MISC Test blood glucose 3-4 times daily as needed. ICD-10 code: E11.40. 02/25/17   Marjie Skiff, MD  pantoprazole (PROTONIX) 40 MG tablet Take 1 tablet (40 mg total) by mouth daily. 01/25/18   Diallo, Earna Coder, MD  quinapril-hydrochlorothiazide (ACCURETIC) 20-25 MG tablet Take 1 tablet by mouth daily. 02/24/17   Diallo, Earna Coder, MD  sertraline (ZOLOFT) 50 MG tablet Take 1 tablet (50 mg total) by mouth daily. 11/27/14   Olam Idler, MD    Family History Family History  Problem Relation Age of Onset  . Diabetes Daughter   . Hyperlipidemia Mother   . Diabetes Mother   . Cancer Brother   . Hyperlipidemia Brother   . Colon cancer Neg Hx   . Colon polyps Neg Hx   . Rectal cancer Neg Hx   . Stomach cancer Neg Hx   . Esophageal cancer Neg Hx     Social History Social History   Tobacco Use  . Smoking status: Former Smoker    Years: 30.00    Types: Cigarettes    Last attempt to quit: 10/09/2014    Years since quitting: 3.4  . Smokeless tobacco: Never Used  . Tobacco comment: 1 cig a week  Substance Use Topics  . Alcohol use: Yes    Alcohol/week: 0.6 oz    Types: 1 Cans of beer per week    Comment: occasional beer  . Drug use: No     Allergies   Metformin   Review of Systems Review of Systems  Reason unable to perform ROS: See HPI as above.     Physical Exam Triage Vital Signs ED Triage Vitals  Enc Vitals Group     BP 03/08/18 1012 (!) 159/82     Pulse Rate 03/08/18 1012 88     Resp 03/08/18 1012 18     Temp 03/08/18 1012 98.2 F (36.8 C)     Temp src --      SpO2 03/08/18 1012 99 %     Weight --      Height --      Head Circumference --      Peak Flow --      Pain Score 03/08/18 1013 0      Pain Loc --      Pain Edu? --      Excl. in [place]? --    No data found.  Updated Vital Signs BP (!) 159/82   Pulse 88   Temp 98.2  F (36.8 C)   Resp 18   SpO2 99%   Visual Acuity Right Eye Distance: 20/25 Left Eye Distance: 20/20 Bilateral Distance: 20/20  Right Eye Near:   Left Eye Near:    Bilateral Near:     Physical Exam  Constitutional: She is oriented to person, place, and time. She appears well-developed and well-nourished. No distress.  HENT:  Head: Normocephalic and atraumatic.  Eyes: EOM and lids are normal. Lids are everted and swept, no foreign bodies found. Right conjunctiva is injected. Left conjunctiva is not injected.  Right pupil teardrop, reactive to light.  Left pupil round, reactive to light.  No photophobia on exam.  IOP ([place]): 20, 21, 22  Fluorescein stain without uptake.  Neurological: She is alert and oriented to person, place, and time.    UC Treatments / Results  Labs (all labs ordered are listed, but only abnormal results are displayed) Labs Reviewed - No data to display  EKG None  Radiology No results found.  Procedures Procedures (including critical care time)  Medications Ordered in UC Medications - No data to display  Initial Impression / Assessment and Plan / UC Course  I have reviewed the triage vital signs and the nursing notes.  Pertinent labs & imaging results that were available during my care of the patient were reviewed by me and considered in my medical decision making (see chart for details).    Discussed case with patient's ophthalmologist, Dr. Hillery Hunter.  Will discontinue Restasis.  Have patient continue artificial tear drops.  Patient to follow-up with Dr. Brigitte Pulse tomorrow for recheck.  Return precautions given.  Patient expresses understanding and agrees to plan.  Final Clinical Impressions(s) / UC Diagnoses   Final diagnoses:  Acute conjunctivitis of right eye, unspecified acute conjunctivitis type     ED Prescriptions    None        Ok Edwards, PA-C 03/08/18 1047

## 2018-03-08 NOTE — ED Triage Notes (Addendum)
Pt here for right eye redness, itching, irritation and drainage x 1 week. 2 weeks ago she saw her eye doctor and was given re stasis for dry eyes.

## 2018-03-08 NOTE — Discharge Instructions (Signed)
As discussed, I talked with Dr Manuella Ghazi. He would like you to stop the Restasis for now and monitor with artificial tears. He would like to see you tomorrow at 8:30am. Please follow up as scheduled. If symptoms worsens throughout the day, please give Dr Trena Platt office a call. You can also follow up here for recheck as needed.

## 2018-03-13 ENCOUNTER — Emergency Department (HOSPITAL_COMMUNITY): Payer: Medicare Other

## 2018-03-13 ENCOUNTER — Other Ambulatory Visit: Payer: Self-pay

## 2018-03-13 ENCOUNTER — Emergency Department (HOSPITAL_COMMUNITY)
Admission: EM | Admit: 2018-03-13 | Discharge: 2018-03-13 | Disposition: A | Discharge status: Home / Self Care | Payer: Medicare Other | Attending: Emergency Medicine | Admitting: Emergency Medicine

## 2018-03-13 ENCOUNTER — Encounter (HOSPITAL_COMMUNITY): Payer: Self-pay

## 2018-03-13 DIAGNOSIS — Z87891 Personal history of nicotine dependence: Secondary | ICD-10-CM | POA: Insufficient documentation

## 2018-03-13 DIAGNOSIS — Y939 Activity, unspecified: Secondary | ICD-10-CM | POA: Diagnosis not present

## 2018-03-13 DIAGNOSIS — Z79899 Other long term (current) drug therapy: Secondary | ICD-10-CM | POA: Diagnosis not present

## 2018-03-13 DIAGNOSIS — Y999 Unspecified external cause status: Secondary | ICD-10-CM | POA: Insufficient documentation

## 2018-03-13 DIAGNOSIS — N183 Chronic kidney disease, stage 3 (moderate): Secondary | ICD-10-CM | POA: Diagnosis not present

## 2018-03-13 DIAGNOSIS — I129 Hypertensive chronic kidney disease with stage 1 through stage 4 chronic kidney disease, or unspecified chronic kidney disease: Secondary | ICD-10-CM | POA: Diagnosis not present

## 2018-03-13 DIAGNOSIS — M25561 Pain in right knee: Secondary | ICD-10-CM | POA: Diagnosis not present

## 2018-03-13 DIAGNOSIS — W19XXXA Unspecified fall, initial encounter: Secondary | ICD-10-CM

## 2018-03-13 DIAGNOSIS — W1830XA Fall on same level, unspecified, initial encounter: Secondary | ICD-10-CM | POA: Insufficient documentation

## 2018-03-13 DIAGNOSIS — Z7982 Long term (current) use of aspirin: Secondary | ICD-10-CM | POA: Diagnosis not present

## 2018-03-13 DIAGNOSIS — Z794 Long term (current) use of insulin: Secondary | ICD-10-CM | POA: Insufficient documentation

## 2018-03-13 DIAGNOSIS — Y92512 Supermarket, store or market as the place of occurrence of the external cause: Secondary | ICD-10-CM | POA: Diagnosis not present

## 2018-03-13 DIAGNOSIS — E1122 Type 2 diabetes mellitus with diabetic chronic kidney disease: Secondary | ICD-10-CM | POA: Diagnosis not present

## 2018-03-13 DIAGNOSIS — S8991XA Unspecified injury of right lower leg, initial encounter: Secondary | ICD-10-CM | POA: Diagnosis present

## 2018-03-13 MED ORDER — IBUPROFEN 800 MG PO TABS
800.0000 mg | ORAL_TABLET | Freq: Once | ORAL | Status: AC
  Administered 2018-03-13: 800 mg via ORAL
  Filled 2018-03-13: qty 1

## 2018-03-13 MED ORDER — NAPROXEN 375 MG PO TABS: 375.0000 mg | ORAL_TABLET | Freq: Two times a day (BID) | ORAL | 0 refills | Status: DC

## 2018-03-13 NOTE — ED Provider Notes (Signed)
[place] DEPT Provider Note   CSN: [id number] Arrival date & time: 03/13/18  1629     History   Chief Complaint Chief Complaint  Patient presents with  . Fall  . Knee Pain    HPI Anna [name] is a 72 y.o. female presenting for evaluation of R knee pain.  Pt states she was in [employer] when she slipped, landing on her R knee with her leg under her. She state the floor was wet, which is why heh slipped. She reports acute onset right knee pain.  Pain is mostly anterior and medial.  She has been able to ambulate with pain.  She denies numbness or tingling.  She denies radiation of the pain.  She denies pain elsewhere including her ankle or hip.  She did not hit her head or lose consciousness.  She is not on blood thinners.  She has not taken anything for pain including Tylenol or ibuprofen since the event.  It occurred just prior to arrival.  HPI  Past Medical History:  Diagnosis Date  . Arthritis   . Blood transfusion without reported diagnosis    40 plus yrs ago  . Cataract   . CKD (chronic kidney disease), stage III ([place]) 08/10/2014  . Diabetes mellitus   . GERD (gastroesophageal reflux disease)   . Glaucoma   . Hyperlipidemia   . Hypertension     Patient Active Problem List   Diagnosis Date Noted  . Plantar warts 06/17/2016  . Benign essential HTN   . Cranial nerve VI palsy   . 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 Diplopia 11/20/2015  . Abdominal pain, epigastric 03/02/2015  . TIA (transient ischemic attack) 10/25/2014  . CKD (chronic kidney disease), stage III ([place]) 08/10/2014  . HLD (hyperlipidemia) 06/12/2014  . Right shoulder pain 06/12/2014  . Depression 12/03/2012  . History of tobacco use 06/09/2009  . VAGINITIS, ATROPHIC 10/25/2008  . COLONIC POLYPS 01/21/2007  . Diabetes mellitus with diabetic neuropathy, with long-term current use of insulin ([place]) 11/19/2006  . OBESITY, NOS 11/19/2006  .  GLAUCOMA 11/19/2006  . CATARACT 11/19/2006  . HYPERTENSION, BENIGN SYSTEMIC 11/19/2006  . DIVERTICULOSIS OF COLON 11/19/2006  . PROTEINURIA 11/19/2006    Past Surgical History:  Procedure Laterality Date  . ABDOMINAL HYSTERECTOMY    . COLONOSCOPY    . POLYPECTOMY       OB History   None      Home Medications    Prior to Admission medications   Medication Sig Start Date End Date Taking? Authorizing Provider  amLODipine (NORVASC) 5 MG tablet Take 1 tablet (5 mg total) by mouth daily. 04/13/17 05/14/18  Marjie Skiff, MD  aspirin 81 MG tablet Take 1 tablet (81 mg total) by mouth daily. 02/24/17   Diallo, Earna Coder, MD  atorvastatin (LIPITOR) 40 MG tablet Take 1 tablet (40 mg total) by mouth daily. 09/30/17   Diallo, Earna Coder, MD  Blood Glucose Monitoring Suppl (ONE TOUCH ULTRA 2) w/Device KIT 1 Device by Does not apply route once. 11/29/15   Olam Idler, MD  canagliflozin (INVOKANA) 100 MG TABS tablet Take 1 tablet (100 mg total) by mouth daily. 01/25/18   Diallo, Earna Coder, MD  canagliflozin (INVOKANA) 300 MG TABS tablet Take 1 tablet (300 mg total) by mouth daily before breakfast. 03/04/17   Diallo, Earna Coder, MD  cyclobenzaprine (FLEXERIL) 10 MG tablet Take 1 tablet (10 mg total) by mouth 2 (two) times daily  as needed for muscle spasms. 07/09/16   Domenic Moras, PA-C  glucose blood (ONE TOUCH ULTRA TEST) test strip Check sugar 3-4 x daily as needed 02/24/17   Diallo, Earna Coder, MD  HYDROmorphone (DILAUDID) 4 MG tablet Take 1 tablet (4 mg total) by mouth every 4 (four) hours as needed for severe pain. 07/12/16   Leonard Schwartz, MD  insulin glargine (LANTUS) 100 UNIT/ML injection INJECT 60 UNITS SUBCUTANEOUSLY ONCE DAILY 02/24/17   Diallo, Abdoulaye, MD  Insulin Pen Needle (B-D UF III MINI PEN NEEDLES) 31G X 5 MM MISC Use with Solostar Lantus pen needles 02/24/17   Diallo, Abdoulaye, MD  lidocaine (LIDODERM) 5 % Place 1 patch onto the skin daily. Remove & Discard patch within 12 hours or as  directed by MD 10/20/16   Long, Wonda Olds, MD  metoprolol succinate (TOPROL XL) 25 MG 24 hr tablet Take 1 tablet (25 mg total) by mouth daily. 11/27/14 12/14/15  Olam Idler, MD  naproxen (NAPROSYN) 375 MG tablet Take 1 tablet (375 mg total) by mouth 2 (two) times daily with a meal. 03/13/18   Shareef Eddinger, PA-C  ONETOUCH DELICA LANCETS 83M MISC Test blood glucose 3-4 times daily as needed. ICD-10 code: E11.40. 02/25/17   Marjie Skiff, MD  pantoprazole (PROTONIX) 40 MG tablet Take 1 tablet (40 mg total) by mouth daily. 01/25/18   Diallo, Earna Coder, MD  quinapril-hydrochlorothiazide (ACCURETIC) 20-25 MG tablet Take 1 tablet by mouth daily. 02/24/17   Diallo, Earna Coder, MD  sertraline (ZOLOFT) 50 MG tablet Take 1 tablet (50 mg total) by mouth daily. 11/27/14   Olam Idler, MD    Family History Family History  Problem Relation Age of Onset  . Diabetes Daughter   . Hyperlipidemia Mother   . Diabetes Mother   . Cancer Brother   . Hyperlipidemia Brother   . Colon cancer Neg Hx   . Colon polyps Neg Hx   . Rectal cancer Neg Hx   . Stomach cancer Neg Hx   . Esophageal cancer Neg Hx     Social History Social History   Tobacco Use  . Smoking status: Former Smoker    Years: 30.00    Types: Cigarettes    Last attempt to quit: 10/09/2014    Years since quitting: 3.4  . Smokeless tobacco: Never Used  . Tobacco comment: 1 cig a week  Substance Use Topics  . Alcohol use: Yes    Alcohol/week: 0.6 oz    Types: 1 Cans of beer per week    Comment: occasional beer  . Drug use: No     Allergies   Metformin   Review of Systems Review of Systems  Musculoskeletal: Positive for arthralgias and joint swelling.  Neurological: Negative for numbness.  Hematological: Does not bruise/bleed easily.     Physical Exam Updated Vital Signs BP (!) 152/97 (BP Location: Left Arm)   Pulse 79   Temp 98.3 F (36.8 C) (Oral)   Resp 18   Ht '5\' 2"'  (1.575 m)   Wt 79.4 kg (175 lb)   SpO2 97%    BMI 32.01 kg/m   Physical Exam  Constitutional: She is oriented to person, place, and time. She appears well-developed and well-nourished. No distress.  HENT:  Head: Normocephalic and atraumatic.  Eyes: EOM are normal.  Neck: Normal range of motion.  Pulmonary/Chest: Effort normal.  Abdominal: She exhibits no distension.  Musculoskeletal: Normal range of motion. She exhibits edema and tenderness.  Mild TTP of anterior and medial  R knee. Mild swelling of R knee.  No obvious deformity.  Full active range of motion of the knee.  Pedal pulses intact bilaterally.  Patient is ambulatory.  Full active range of motion of the ankle without pain.  No erythema or warmth.  Popliteal pulse intact.  Neurological: She is alert and oriented to person, place, and time. No sensory deficit.  Skin: Skin is warm. No rash noted.  Psychiatric: She has a normal mood and affect.  Nursing note and vitals reviewed.    ED Treatments / Results  Labs (all labs ordered are listed, but only abnormal results are displayed) Labs Reviewed - No data to display  EKG None  Radiology Dg Knee Complete 4 Views Right  Result Date: 03/13/2018 CLINICAL DATA:  Acute right knee pain after fall today. EXAM: RIGHT KNEE - COMPLETE 4+ VIEW COMPARISON:  None. FINDINGS: No evidence of fracture, dislocation, or joint effusion. Mild narrowing of medial joint space is noted. Minimal suprapatellar joint effusion is noted. Mild patellar spurring is noted. Soft tissues are unremarkable. IMPRESSION: Mild degenerative disc disease is noted medially. No acute abnormality seen in the right knee. Electronically Signed   By: Marijo Conception, M.D.   On: 03/13/2018 17:33    Procedures Procedures (including critical care time)  Medications Ordered in ED Medications  ibuprofen (ADVIL,MOTRIN) tablet 800 mg (800 mg Oral Given 03/13/18 1707)     Initial Impression / Assessment and Plan / ED Course  I have reviewed the triage vital signs and  the nursing notes.  Pertinent labs & imaging results that were available during my care of the patient were reviewed by me and considered in my medical decision making (see chart for details).     Patient presenting for evaluation of right knee pain.  Physical exam shows mild erythema and tenderness.  Patient is ambulatory, neurovascularly intact.  X-ray reviewed and interpreted by me, no fractures or dislocations.  She has mild arthritis.  Discussed findings with patient.  This is likely a muscular contusion.  Doubt sepsis.  Less likely ligamentous injury, although this is possible.  Will treat with knee brace, NSAIDs, rest, and ice.  Discussed with patient.  Discussed follow-up with primary care next week for further evaluation and management.  At this time, patient appears safe for discharge.  Return precautions given.  Patient states she understands and agrees to plan.   Final Clinical Impressions(s) / ED Diagnoses   Final diagnoses:  Fall, initial encounter  Acute pain of right knee    ED Discharge Orders        Ordered    naproxen (NAPROSYN) 375 MG tablet  2 times daily with meals     03/13/18 1756       Mel Langan, PA-C 03/13/18 1937    Quintella Reichert, MD 03/14/18 1435

## 2018-03-13 NOTE — Discharge Instructions (Addendum)
When you fell, you likely bruised the muscles that protect your knee. You have pain and swelling over the patellar tendon and pes anserine. This should improve with time, rest, ice, elevation, and antiinflammatories.  Take naproxen 2 times a day with meals.  Do not take other anti-inflammatories at the same time open (Advil, Motrin, ibuprofen, Aleve). You may supplement with Tylenol if you need further pain control. Use ice packs to help with pain and swelling.  Elevate and rest your knee when able.  Use the knee brace for pain control and support.  Follow up with your primary care doctor for further evaluation.  Return to the ER if you develop numbness, inability to walk, or any new or concerning symptoms.

## 2018-03-13 NOTE — ED Triage Notes (Signed)
Per EMS- patient reports that she slipped on water at [employer]. Patient c/o right knee pain. Patient ambulatory.

## 2018-03-15 ENCOUNTER — Ambulatory Visit: Payer: Medicare Other | Admitting: Internal Medicine

## 2018-03-31 ENCOUNTER — Other Ambulatory Visit: Payer: Self-pay | Admitting: Family Medicine

## 2018-03-31 ENCOUNTER — Ambulatory Visit (INDEPENDENT_AMBULATORY_CARE_PROVIDER_SITE_OTHER): Payer: Medicare Other | Admitting: Family Medicine

## 2018-03-31 ENCOUNTER — Encounter: Payer: Self-pay | Admitting: Family Medicine

## 2018-03-31 ENCOUNTER — Other Ambulatory Visit: Payer: Self-pay

## 2018-03-31 VITALS — BP 138/80 | HR 79 | Temp 98.8°F | Ht 61.0 in | Wt 174.0 lb

## 2018-03-31 DIAGNOSIS — E1140 Type 2 diabetes mellitus with diabetic neuropathy, unspecified: Secondary | ICD-10-CM

## 2018-03-31 DIAGNOSIS — Z23 Encounter for immunization: Secondary | ICD-10-CM

## 2018-03-31 DIAGNOSIS — Z794 Long term (current) use of insulin: Secondary | ICD-10-CM

## 2018-03-31 LAB — POCT GLYCOSYLATED HEMOGLOBIN (HGB A1C): HbA1c, POC (controlled diabetic range): 10.8 % — AB (ref 0.0–7.0)

## 2018-03-31 MED ORDER — TETANUS-DIPHTH-ACELL PERTUSSIS 5-2-15.5 LF-MCG/0.5 IM SUSP: 0.5000 mL | Freq: Once | INTRAMUSCULAR | 0 refills | Status: AC

## 2018-03-31 NOTE — Progress Notes (Signed)
   Subjective:    Patient ID: Anna [name], female    DOB: [birth year], 72 y.o.   MRN: [id number]   CC: Type 2 diabetes follow-up  HPI: Patient is a 72 year old female past medical history significant for hypertension, CKD 3, type 2 diabetes, presents today for follow-up on management of diabetes.  Last A1c was 12.0.  Patient was sent to diabetic education class which she reports helped her modify her diet.  Patient reports that she has been trying to control portions better and eating less processed food.  Patient has been adherent to her diabetic medication and is doing Lantus 70 units daily as well as Invokana.  She denies any polyuria and polydipsia.  No acute complaints today.  Smoking status reviewed   ROS: all other systems were reviewed and are negative other than in the HPI   Past Medical History:  Diagnosis Date  . Arthritis   . Blood transfusion without reported diagnosis    40 plus yrs ago  . Cataract   . CKD (chronic kidney disease), stage III ([place]) 08/10/2014  . Diabetes mellitus   . GERD (gastroesophageal reflux disease)   . Glaucoma   . Hyperlipidemia   . Hypertension     Past Surgical History:  Procedure Laterality Date  . ABDOMINAL HYSTERECTOMY    . COLONOSCOPY    . POLYPECTOMY      Past medical history, surgical, family, and social history reviewed and updated in the EMR as appropriate.  Objective:  BP 138/80   Pulse 79   Temp 98.8 F (37.1 C) (Oral)   Ht 5\' 1"  (1.549 m)   Wt 174 lb (78.9 kg)   SpO2 96%   BMI 32.88 kg/m   Vitals and nursing note reviewed  General: NAD, pleasant, able to participate in exam Cardiac: RRR, normal heart sounds, no murmurs. 2+ radial and PT pulses bilaterally Respiratory: CTAB, normal effort, No wheezes, rales or rhonchi Abdomen: soft, nontender, nondistended, no hepatic or splenomegaly, +BS Extremities: no edema or cyanosis. WWP. Skin: warm and dry, no rashes noted Neuro: alert and oriented x4, no focal  deficits Psych: Normal affect and mood   Assessment & Plan:    Diabetes mellitus with diabetic neuropathy, with long-term current use of insulin (HCC) Today A1c is 10.8 improved from last A1c back in March.  Patient has been working on therapeutic lifestyle changes, seems to have benefited from diabetic education class.  Patient still above goal but seems to be progress.  We will continue current treatment regimen Lantus 70 units daily, Invokana 100 mg daily.  Recheck A1c in 3 months.  If still above 10 will further adjust medication.    Anna Skiff, MD [place] PGY-2

## 2018-03-31 NOTE — Assessment & Plan Note (Signed)
Today A1c is 10.8 improved from last A1c back in March.  Patient has been working on therapeutic lifestyle changes, seems to have benefited from diabetic education class.  Patient still above goal but seems to be progress.  We will continue current treatment regimen Lantus 70 units daily, Invokana 100 mg daily.  Recheck A1c in 3 months.  If still above 10 will further adjust medication.

## 2018-03-31 NOTE — Patient Instructions (Signed)
  Diet Recommendations for Diabetes   Starchy (carb) foods: Bread, rice, pasta, potatoes, corn, cereal, grits, crackers, bagels, muffins, all baked goods.  (Fruits, milk, and yogurt also have carbohydrate, but most of these foods will not spike your blood sugar as the starchy foods will.)  A few fruits do cause high blood sugars; use small portions of bananas (limit to 1/2 at a time), grapes, watermelon, oranges, and most tropical fruits.    Protein foods: Meat, fish, poultry, eggs, dairy foods, and beans such as pinto and kidney beans (beans also provide carbohydrate).   1. Eat at least 3 meals and 1-2 snacks per day. Never go more than 4-5 hours while awake without eating. Eat breakfast within the first hour of getting up.   2. Limit starchy foods to TWO per meal and ONE per snack. ONE portion of a starchy  food is equal to the following:   - ONE slice of bread (or its equivalent, such as half of a hamburger bun).   - 1/2 cup of a "scoopable" starchy food such as potatoes or rice.   - 15 grams of carbohydrate as shown on food label.  3. Include at every meal: a protein food, a carb food, and vegetables and/or fruit.   - Obtain twice the volume of veg's as protein or carbohydrate foods for both lunch and dinner.   - Fresh or frozen veg's are best.   - Keep frozen veg's on hand for a quick vegetable serving.

## 2018-04-01 LAB — BASIC METABOLIC PANEL
BUN / CREAT RATIO: 17 (ref 12–28)
BUN: 23 mg/dL (ref 8–27)
CO2: 27 mmol/L (ref 20–29)
Calcium: 9.4 mg/dL (ref 8.7–10.3)
Chloride: 103 mmol/L (ref 96–106)
Creatinine, Ser: 1.34 mg/dL — ABNORMAL HIGH (ref 0.57–1.00)
GFR calc non Af Amer: 40 mL/min/{1.73_m2} — ABNORMAL LOW (ref >59)
GFR, EST AFRICAN AMERICAN: 47 mL/min/{1.73_m2} — AB (ref >59)
GLUCOSE: 186 mg/dL — AB (ref 65–99)
POTASSIUM: 5 mmol/L (ref 3.5–5.2)
SODIUM: 143 mmol/L (ref 134–144)

## 2018-04-06 ENCOUNTER — Other Ambulatory Visit: Payer: Self-pay | Admitting: Family Medicine

## 2018-04-06 DIAGNOSIS — Z1231 Encounter for screening mammogram for malignant neoplasm of breast: Secondary | ICD-10-CM

## 2018-04-10 ENCOUNTER — Other Ambulatory Visit: Payer: Self-pay | Admitting: Family Medicine

## 2018-04-10 DIAGNOSIS — I10 Essential (primary) hypertension: Secondary | ICD-10-CM

## 2018-05-11 ENCOUNTER — Ambulatory Visit
Admission: RE | Admit: 2018-05-11 | Discharge: 2018-05-11 | Disposition: A | Discharge status: Home / Self Care | Payer: Medicare Other | Source: Ambulatory Visit | Attending: Family Medicine | Admitting: Family Medicine

## 2018-05-11 DIAGNOSIS — Z1231 Encounter for screening mammogram for malignant neoplasm of breast: Secondary | ICD-10-CM

## 2018-05-27 ENCOUNTER — Encounter (HOSPITAL_COMMUNITY): Payer: Self-pay | Admitting: *Deleted

## 2018-05-27 ENCOUNTER — Other Ambulatory Visit: Payer: Self-pay

## 2018-05-27 ENCOUNTER — Ambulatory Visit (HOSPITAL_COMMUNITY)
Admission: EM | Admit: 2018-05-27 | Discharge: 2018-05-27 | Disposition: A | Discharge status: Home / Self Care | Payer: Medicaid Other | Attending: Family Medicine | Admitting: Family Medicine

## 2018-05-27 DIAGNOSIS — L03317 Cellulitis of buttock: Secondary | ICD-10-CM

## 2018-05-27 MED ORDER — TRIAMCINOLONE ACETONIDE 0.1 % EX CREA: 1.0000 "application " | TOPICAL_CREAM | Freq: Two times a day (BID) | CUTANEOUS | 0 refills | Status: DC

## 2018-05-27 MED ORDER — SULFAMETHOXAZOLE-TRIMETHOPRIM 800-160 MG PO TABS: 1.0000 | ORAL_TABLET | Freq: Two times a day (BID) | ORAL | 0 refills | Status: AC

## 2018-05-27 NOTE — ED Notes (Signed)
Patient changing into gown 

## 2018-05-27 NOTE — ED Triage Notes (Signed)
C/o open area at the "top of her butt cheeks". Onset 4-5 days ago

## 2018-05-27 NOTE — ED Provider Notes (Signed)
[facility]   [id number] 05/27/18 Arrival Time: 5176  ASSESSMENT & PLAN:  1. Cellulitis of buttock     Meds ordered this encounter  Medications  . sulfamethoxazole-trimethoprim (BACTRIM DS,SEPTRA DS) 800-160 MG tablet    Sig: Take 1 tablet by mouth 2 (two) times daily for 10 days.    Dispense:  20 tablet    Refill:  0  . triamcinolone cream (KENALOG) 0.1 %    Sig: Apply 1 application topically 2 (two) times daily.    Dispense:  30 g    Refill:  0   Requests cream as this area also itches.  Follow-up Information    [facility].   Specialty:  Urgent Care Why:  Follow up in 2-3 days if you are not seeing improvement with the antibiotic. Contact information: [place] [phone]          Reviewed expectations re: course of current medical issues. Questions answered. Outlined signs and symptoms indicating need for more acute intervention. Patient verbalized understanding. After Visit Summary given.   SUBJECTIVE:  Anna [name] is a 72 y.o. female who presents with a skin complaint.   Location: superior gluteal cleft Onset: gradual Duration: 4 days Pruritic? mildly Painful? Yes; 'sore' Progression: stable  Drainage? No  Known trigger? No  New soaps/lotions/topicals/detergents? No Environmental exposures or allergies? none Contacts with similar? No Recent travel? No  Other associated symptoms: none Therapies tried thus far: none Denies fever. No specific aggravating or alleviating factors reported.  ROS: As per HPI.  OBJECTIVE: Vitals:   05/27/18 1801  BP: (!) 189/93  Pulse: 76  Resp: 16  Temp: 98 F (36.7 C)  TempSrc: Oral  SpO2: 98%    General appearance: alert; no distress Lungs: clear to auscultation bilaterally Heart: regular rate and rhythm Extremities: no edema Skin: warm and dry; superior gluteal cleft with approx 1-1.5 cm area of erythema and  thickened skin; warm; tender; no fluctuance; no bleeding Psychological: alert and cooperative; normal mood and affect  Allergies  Allergen Reactions  . Metformin     REACTION: GI upset - Diarrhea    Past Medical History:  Diagnosis Date  . Arthritis   . Blood transfusion without reported diagnosis    40 plus yrs ago  . Cataract   . CKD (chronic kidney disease), stage III ([place]) 08/10/2014  . Diabetes mellitus   . GERD (gastroesophageal reflux disease)   . Glaucoma   . Hyperlipidemia   . Hypertension    Social History   Socioeconomic History  . Marital status: Divorced    Spouse name: Not on file  . Number of children: 4  . Years of education: 10  . Highest education level: Not on file  Occupational History  . Occupation: Retired  Scientific laboratory technician  . Financial resource strain: Not on file  . Food insecurity:    Worry: Not on file    Inability: Not on file  . Transportation needs:    Medical: Not on file    Non-medical: Not on file  Tobacco Use  . Smoking status: Former Smoker    Years: 30.00    Types: Cigarettes    Last attempt to quit: 10/09/2014    Years since quitting: 3.6  . Smokeless tobacco: Never Used  . Tobacco comment: 1 cig a week  Substance and Sexual Activity  . Alcohol use: Yes    Alcohol/week: 1.0 standard drinks  Types: 1 Cans of beer per week    Comment: occasional beer  . Drug use: No  . Sexual activity: Not on file  Lifestyle  . Physical activity:    Days per week: Not on file    Minutes per session: Not on file  . Stress: Not on file  Relationships  . Social connections:    Talks on phone: Not on file    Gets together: Not on file    Attends religious service: Not on file    Active member of club or organization: Not on file    Attends meetings of clubs or organizations: Not on file    Relationship status: Not on file  . Intimate partner violence:    Fear of current or ex partner: Not on file    Emotionally abused: Not on file     Physically abused: Not on file    Forced sexual activity: Not on file  Other Topics Concern  . Not on file  Social History Narrative   Health Care POA:    Emergency Contact: [name] (daughter) [phone]   End of Life Plan: Pt will bring copy of living will to next visit   Who lives with you: By herself   Any pets: 0   Diet: Pt reports recently reducing her intake of starches and carbohydrates. Pt reports eating more fresh fruits and vegetables to help reduce her A1C.     Exercise: Pt reports using the treadmill for 60 minutes, 2-3 times per week.    Seatbelts: Pt reports wearing a seatbelt when in vehicle.   Nancy Fetter Exposure/Protection: Pt denies wearing any protection but mentioned she will start due to side effects of her recently prescribed medication.    Hobbies: Reading and going to movies with family.   Family History  Problem Relation Age of Onset  . Diabetes Daughter   . Hyperlipidemia Mother   . Diabetes Mother   . Cancer Brother   . Hyperlipidemia Brother   . Colon cancer Neg Hx   . Colon polyps Neg Hx   . Rectal cancer Neg Hx   . Stomach cancer Neg Hx   . Esophageal cancer Neg Hx    Past Surgical History:  Procedure Laterality Date  . ABDOMINAL HYSTERECTOMY    . COLONOSCOPY    . POLYPECTOMY       Vanessa Kick, MD 05/27/18 [phone]

## 2018-07-15 ENCOUNTER — Other Ambulatory Visit: Payer: Self-pay | Admitting: *Deleted

## 2018-07-15 DIAGNOSIS — I10 Essential (primary) hypertension: Secondary | ICD-10-CM

## 2018-07-16 MED ORDER — AMLODIPINE BESYLATE 5 MG PO TABS: 5.0000 mg | ORAL_TABLET | Freq: Every day | ORAL | 3 refills | Status: DC

## 2018-12-28 ENCOUNTER — Other Ambulatory Visit: Payer: Self-pay | Admitting: Family Medicine

## 2018-12-28 DIAGNOSIS — Z794 Long term (current) use of insulin: Principal | ICD-10-CM

## 2018-12-28 DIAGNOSIS — E119 Type 2 diabetes mellitus without complications: Secondary | ICD-10-CM

## 2018-12-30 ENCOUNTER — Ambulatory Visit: Payer: Medicare Other | Admitting: Neurology

## 2019-02-07 ENCOUNTER — Other Ambulatory Visit: Payer: Self-pay | Admitting: Internal Medicine

## 2019-02-07 DIAGNOSIS — Z1231 Encounter for screening mammogram for malignant neoplasm of breast: Secondary | ICD-10-CM

## 2019-02-10 ENCOUNTER — Other Ambulatory Visit: Payer: Self-pay | Admitting: Family Medicine

## 2019-02-10 DIAGNOSIS — E1140 Type 2 diabetes mellitus with diabetic neuropathy, unspecified: Secondary | ICD-10-CM

## 2019-02-10 DIAGNOSIS — Z794 Long term (current) use of insulin: Secondary | ICD-10-CM

## 2019-03-02 ENCOUNTER — Telehealth: Payer: Self-pay | Admitting: Neurology

## 2019-03-02 NOTE — Telephone Encounter (Signed)
Due to current COVID 19 pandemic, our office is severely reducing in office visits until further notice, in order to minimize the risk to our patients and healthcare providers.   Called patient and offered her a virtual visit for her 6/15 appt. Patient declined as she does not have internet access. Patient would prefer an in office visit and to bring her daughter with her who will drive her and accompany her inside. She understands the precautions we are taking for in office visits. Patient understands that upon arrival she will need to park next to entrance and wait for assistance from a staff member.

## 2019-03-07 ENCOUNTER — Ambulatory Visit: Payer: Medicare Other | Admitting: Neurology

## 2019-03-07 NOTE — Telephone Encounter (Signed)
Called in and stated she needed to cancel her appt due to stomach iossues// pt is wanting to r/s

## 2019-03-09 ENCOUNTER — Other Ambulatory Visit: Payer: Self-pay

## 2019-03-09 ENCOUNTER — Ambulatory Visit (INDEPENDENT_AMBULATORY_CARE_PROVIDER_SITE_OTHER): Payer: Medicare Other | Admitting: Neurology

## 2019-03-09 ENCOUNTER — Encounter: Payer: Self-pay | Admitting: Neurology

## 2019-03-09 VITALS — BP 152/86 | HR 65 | Temp 97.9°F | Ht 62.0 in | Wt 182.0 lb

## 2019-03-09 DIAGNOSIS — E669 Obesity, unspecified: Secondary | ICD-10-CM | POA: Diagnosis not present

## 2019-03-09 DIAGNOSIS — R413 Other amnesia: Secondary | ICD-10-CM

## 2019-03-09 DIAGNOSIS — R351 Nocturia: Secondary | ICD-10-CM

## 2019-03-09 DIAGNOSIS — R51 Headache: Secondary | ICD-10-CM | POA: Diagnosis not present

## 2019-03-09 DIAGNOSIS — R519 Headache, unspecified: Secondary | ICD-10-CM

## 2019-03-09 DIAGNOSIS — Z820 Family history of epilepsy and other diseases of the nervous system: Secondary | ICD-10-CM

## 2019-03-09 DIAGNOSIS — Z8659 Personal history of other mental and behavioral disorders: Secondary | ICD-10-CM

## 2019-03-09 NOTE — Progress Notes (Addendum)
Subjective:    Patient ID: Anna [name] is a 73 y.o. female.  HPI     Star Age, MD, PhD [facility] [address] [place], [place] [address]  Dear [name],   I saw your patient, Anna [name], upon your kind request in my neurologic clinic today for initial consultation of her memory loss.  The patient is unaccompanied today. She missed an appointment on 03/07/2019 secondary to GI illness.  As you know, Anna [name] is a 73 year old right-handed woman with an underlying medical history of diabetes, hypertension, hyperlipidemia, glaucoma, reflux disease, arthritis, chronic kidney disease and obesity, who reports issues with short-term memory in the past several months.  She would forget conversations or misplace items.  She reports that she was also quite depressed at the same time, tearful on having bouts of crying.  Her depression has significantly improved since she started Lexapro.  This was started in February.  She denies a family history of depression.  She had one half brother who was older and died about 87 years ago at age 34.  She has 4 daughters, 2 daughters have sleep apnea and uses CPAP machine.  She does not sleep well.  She endorses snoring and occasionally wakes up with a headache.  She has nocturia about once or twice per average night.  She goes to bed between 630 and 7 and typically watches TV until about 11.  Rise time varies, but mostly around 7.  She is busy, has close interaction with her family.  She has 7 grandchildren and 11 great grandchildren.  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  She has 1/10 grade education.  She quit smoking in 2019, she drinks alcohol occasionally, approximately 3 beers per weekend.  She is divorced and lives alone, but 2 of her daughters live in the same apartment building as her.  I reviewed your office note from 10/05/2018, as well as 11/05/2018.  She had lab work through your office on  08/16/2018 and I reviewed the results: Total cholesterol 206, triglycerides elevated at 343, LDL mildly elevated at 105, hepatitis screen was negative, folate normal, B12 1104, parathyroid hormone was 77 which is mildly elevated, vitamin D low at 10.  She had a brain MRI without contrast as well as MRA head without contrast on 11/21/2015 and I reviewed the results: IMPRESSION: MRI head: No acute or significant finding. Normal except for a few tiny white matter foci not of any clinical relevance. MRA head: Normal evaluation of the large and medium size vessels. No stenosis or occlusion. She has recently tried a sleep aid, she cannot recall the name of it.  Overall, in the past 2 or 3 months she feels improved with regards to her memory.  She feels that her depression is much improved as well.  She does not feel is forgetful.  She does not drive, has never driven. Her Epworth sleepiness score is 11 out of 24, fatigue severity score is 61 out of 63.  Her Past Medical History Is Significant For: Past Medical History:  Diagnosis Date  . Arthritis   . Blood transfusion without reported diagnosis    40 plus yrs ago  . Cataract   . CKD (chronic kidney disease), stage III ([place]) 08/10/2014  . Diabetes mellitus   . GERD (gastroesophageal reflux disease)   . Glaucoma   . Hyperlipidemia   . Hypertension     Her Past Surgical History Is Significant For:  Past Surgical History:  Procedure Laterality Date  . ABDOMINAL HYSTERECTOMY    . COLONOSCOPY    . POLYPECTOMY      Her Family History Is Significant For: Family History  Problem Relation Age of Onset  . Diabetes Daughter   . Hyperlipidemia Mother   . Diabetes Mother   . Cancer Brother   . Hyperlipidemia Brother   . Colon cancer Neg Hx   . Colon polyps Neg Hx   . Rectal cancer Neg Hx   . Stomach cancer Neg Hx   . Esophageal cancer Neg Hx     Her Social History Is Significant For: Social History   Socioeconomic History  . Marital  status: Divorced    Spouse name: Not on file  . Number of children: 4  . Years of education: 10  . Highest education level: Not on file  Occupational History  . Occupation: Retired  Scientific laboratory technician  . Financial resource strain: Not on file  . Food insecurity    Worry: Not on file    Inability: Not on file  . Transportation needs    Medical: Not on file    Non-medical: Not on file  Tobacco Use  . Smoking status: Former Smoker    Years: 30.00    Types: Cigarettes    Quit date: 10/09/2014    Years since quitting: 4.4  . Smokeless tobacco: Never Used  . Tobacco comment: 1 cig a week  Substance and Sexual Activity  . Alcohol use: Yes    Alcohol/week: 1.0 standard drinks    Types: 1 Cans of beer per week    Comment: occasional beer  . Drug use: No  . Sexual activity: Not on file  Lifestyle  . Physical activity    Days per week: Not on file    Minutes per session: Not on file  . Stress: Not on file  Relationships  . Social Herbalist on phone: Not on file    Gets together: Not on file    Attends religious service: Not on file    Active member of club or organization: Not on file    Attends meetings of clubs or organizations: Not on file    Relationship status: Not on file  Other Topics Concern  . Not on file  Social History Narrative   Health Care POA:    Emergency Contact: Hamsini Verrilli (daughter) [phone]   End of Life Plan: Pt will bring copy of living will to next visit   Who lives with you: By herself   Any pets: 0   Diet: Pt reports recently reducing her intake of starches and carbohydrates. Pt reports eating more fresh fruits and vegetables to help reduce her A1C.     Exercise: Pt reports using the treadmill for 60 minutes, 2-3 times per week.    Seatbelts: Pt reports wearing a seatbelt when in vehicle.   Nancy Fetter Exposure/Protection: Pt denies wearing any protection but mentioned she will start due to side effects of her recently prescribed medication.     Hobbies: Reading and going to movies with family.    Her Allergies Are:  Allergies  Allergen Reactions  . Metformin     REACTION: GI upset - Diarrhea  :   Her Current Medications Are:  Outpatient Encounter Medications as of 03/09/2019  Medication Sig  . amLODipine (NORVASC) 5 MG tablet Take 1 tablet (5 mg total) by mouth daily.  Marland Kitchen aspirin 81 MG tablet Take 1 tablet (  81 mg total) by mouth daily.  Marland Kitchen atorvastatin (LIPITOR) 40 MG tablet Take 1 tablet (40 mg total) by mouth daily.  . Blood Glucose Monitoring Suppl (ONE TOUCH ULTRA 2) w/Device KIT 1 Device by Does not apply route once.  . canagliflozin (INVOKANA) 300 MG TABS tablet Take 1 tablet (300 mg total) by mouth daily before breakfast.  . cyclobenzaprine (FLEXERIL) 10 MG tablet Take 1 tablet (10 mg total) by mouth 2 (two) times daily as needed for muscle spasms.  Marland Kitchen glucose blood (ONE TOUCH ULTRA TEST) test strip Check sugar 3-4 x daily as needed  . HYDROmorphone (DILAUDID) 4 MG tablet Take 1 tablet (4 mg total) by mouth every 4 (four) hours as needed for severe pain.  . Insulin Glargine (LANTUS SOLOSTAR) 100 UNIT/ML Solostar Pen INJECT 60 UNITS SUBCUTANEOUSLY ONCE DAILY  . Insulin Pen Needle (B-D UF III MINI PEN NEEDLES) 31G X 5 MM MISC Use with Solostar Lantus pen needles  . ONETOUCH DELICA LANCETS 41L MISC Test blood glucose 3-4 times daily as needed. ICD-10 code: E11.40.  Marland Kitchen pantoprazole (PROTONIX) 40 MG tablet Take 1 tablet (40 mg total) by mouth daily.  . quinapril-hydrochlorothiazide (ACCURETIC) 20-25 MG tablet TAKE 1 TABLET BY MOUTH ONCE DAILY  . triamcinolone cream (KENALOG) 0.1 % Apply 1 application topically 2 (two) times daily.  . [DISCONTINUED] sertraline (ZOLOFT) 50 MG tablet Take 1 tablet (50 mg total) by mouth daily.  . metoprolol succinate (TOPROL XL) 25 MG 24 hr tablet Take 1 tablet (25 mg total) by mouth daily.  . [DISCONTINUED] INVOKANA 100 MG TABS tablet Take 1 tablet by mouth once daily  . [DISCONTINUED] lidocaine  (LIDODERM) 5 % Place 1 patch onto the skin daily. Remove & Discard patch within 12 hours or as directed by MD  . [DISCONTINUED] naproxen (NAPROSYN) 375 MG tablet Take 1 tablet (375 mg total) by mouth 2 (two) times daily with a meal.   No facility-administered encounter medications on file as of 03/09/2019.   :   Review of Systems:  Out of a complete 14 point review of systems, all are reviewed and negative with the exception of these symptoms as listed below:  Review of Systems  Neurological:       Pt presents today to discuss her memory. Pt reports that her memory is not as good as it used to be.    Objective:  Neurological Exam  Physical Exam Physical Examination:   Vitals:   03/09/19 1417  BP: (!) 152/86  Pulse: 65  Temp: 97.9 F (36.6 C)   General Examination: The patient is a very pleasant 73 y.o. female in no acute distress. She appears well-developed and well-nourished and well groomed.   HEENT: Normocephalic, atraumatic, pupils are equal, round and reactive to light and accommodation. Extraocular tracking is good without limitation to gaze excursion or nystagmus noted. Normal smooth pursuit is noted. Hearing is grossly intact. Airway exam: Longer uvula, tonsils of 1+, wider tongue.  Mallampati is class II.  Tongue protrudes centrally in palate elevates symmetrically.  Neck circumference is 15-1/2 inches. Face is symmetric with normal facial animation and normal facial sensation. Speech is clear with no dysarthria noted. There is no hypophonia. There is no lip, neck/head, jaw or voice tremor. Neck is supple with full range of passive and active motion. There are no carotid bruits on auscultation. Oropharynx exam reveals: mild mouth dryness, adequate dental hygiene and moderate airway crowding.  Chest: Clear to auscultation without wheezing, rhonchi or crackles noted.  Heart: S1+S2+0, regular and normal without murmurs, rubs or gallops noted.   Abdomen: Soft, non-tender and  non-distended with normal bowel sounds appreciated on auscultation.  Extremities: There is no pitting edema in the distal lower extremities bilaterally. Pedal pulses are intact.  Skin: Warm and dry without trophic changes noted.  Musculoskeletal: exam reveals no obvious joint deformities, tenderness or joint swelling or erythema.   Neurologically:  Mental status: The patient is awake, alert and oriented in all 4 spheres. Her immediate and remote memory, attention, language skills and fund of knowledge are Fairly appropriate, she has no obvious difficulty with naming or word finding. Speech is clear with normal prosody and enunciation. Thought process is linear. Mood is normal and affect is normal.  On 03/09/2019: MMSE: 28/30, AFT: 13/min,CDT:4/4. Cranial nerves II - XII are as described above under HEENT exam. In addition: shoulder shrug is normal with equal shoulder height noted. Motor exam: Normal bulk, strength and tone is noted. There is no drift, tremor or rebound. Romberg is negative. Reflexes are 1+. Fine motor skills and coordination: intact with normal finger taps, normal hand movements, normal rapid alternating patting, normal foot taps and normal foot agility.  Cerebellar testing: No dysmetria or intention tremor on finger to nose testing. Heel to shin is unremarkable bilaterally. There is no truncal or gait ataxia.  Sensory exam: intact to light touch, vibration, temperature sense  proprioception in the upper and lower extremities.  Gait, station and balance: She stands easily. No veering to one side is noted. No leaning to one side is noted. Posture is age-appropriate and stance is narrow based. Gait shows normal stride length and normal pace. No problems turning are noted.   Assessment and Plan:  Assessment and Plan:  In summary, Anna [name] is a very pleasant 73 y.o.-year old female with an underlying medical history of diabetes, hypertension, hyperlipidemia, glaucoma, reflux  disease, arthritis, chronic kidney disease and obesity, who Presents for evaluation of her memory loss on examination, her memory scores are fairly good today.  In fact, she does endorse feeling improved lately in the past 2 or 3 months, especially since starting the antidepressant medication.  I would not recommend any new medication from my end of things, she had recent appropriate blood work through your office as well.  I would recommend we proceed with a brain MRI, particularly also to compare findings with 2017.  She does not have a telltale family history of Alzheimer's disease, she does have some vascular risk factors.  In addition, her history and examination are somewhat concerning for obstructive sleep apnea, 2 of her daughters have OSA and uses CPAP machine.  We talked about sleep apnea and treatment options particularly the use of CPAP.  She would be willing to consider CPAP therapy.  She endorses difficulty with sleep quality, sleep initiation, snoring, nocturia and occasional morning headaches.  To that end, we will proceed with further testing in the form of brain scan and sleep study.  We will keep her posted as to her test results and I will see her in follow-up afterwards. We talked about ongoing smoking cessation and maintaining a healthy lifestyle in general.  I answered all her questions today and the patient was in agreement with the above outlined plan. Thank you very much for allowing me to participate in the care of this nice patient. If I can be of any further assistance to you please do not hesitate to call me at [phone].  Sincerely,  Star Age, MD, PhD

## 2019-03-09 NOTE — Progress Notes (Signed)
Epworth Sleepiness Scale 0= would never doze 1= slight chance of dozing 2= moderate chance of dozing 3= high chance of dozing  Sitting and reading: 1 Watching TV: 3 Sitting inactive in a public place (ex. Theater or meeting): 0 As a passenger in a car for an hour without a break: 3 Lying down to rest in the afternoon: 1 Sitting and talking to someone: 3 Sitting quietly after lunch (no alcohol): 3 In a car, while stopped in traffic: 0 Total: 11  FSS:61

## 2019-03-09 NOTE — Patient Instructions (Signed)
Your Memory scores today are quite good which is reassuring, I am glad to hear that you are also feeling better.Nevertheless, I would like to do some more diagnostic testing from my end of things including a brain MRI, we would compare findings from 3 years ago.In addition, I would like to rule out obstructive sleep apnea with a sleep study.  You may be at risk for this. If you have sleep apnea, I would recommend you try a CPAP machine, it sounds like 2 of your daughters are successfully using a CPAP machine.

## 2019-03-28 ENCOUNTER — Telehealth: Payer: Self-pay

## 2019-03-28 NOTE — Telephone Encounter (Signed)
I did call pt on 6/22 to schedule sleep study. Pt has not called me back. Will call pt this afternoon to schedule sleep study

## 2019-03-28 NOTE — Telephone Encounter (Signed)
Pt called to report she has not received a call yet to schedule her MRI or Sleep Study.  I advised the pt the sleep lab just recently opened back up to over night sleep tests and we are working on getting pt's scheduled as quickly as possible. I advised pt I would send message in regards to MRI. I did not see a order placed for the MRI, but in the note I did read where Dr. Rexene Alberts recommended on MRI 03/09/19.

## 2019-03-28 NOTE — Addendum Note (Signed)
Addended by: Star Age on: 03/28/2019 12:17 PM   Modules accepted: Orders

## 2019-03-28 NOTE — Telephone Encounter (Signed)
Sleep study was ordered, MRI ordered as addendum to visit from 03/09/19

## 2019-03-28 NOTE — Telephone Encounter (Signed)
Noted, the MRI was just put in today.

## 2019-03-28 NOTE — Telephone Encounter (Signed)
[employer] medicare/medicaid order sent to GI. No auth they will reach out to the patient to schedule.  °

## 2019-03-28 NOTE — Telephone Encounter (Signed)
Pt called again today to schedule sleep study. No answer. Again, LVM for pt to call me back to schedule sleep study.

## 2019-04-01 ENCOUNTER — Ambulatory Visit
Admission: RE | Admit: 2019-04-01 | Discharge: 2019-04-01 | Disposition: A | Discharge status: Home / Self Care | Payer: Medicare Other | Source: Ambulatory Visit | Attending: Neurology | Admitting: Neurology

## 2019-04-01 DIAGNOSIS — E669 Obesity, unspecified: Secondary | ICD-10-CM

## 2019-04-01 DIAGNOSIS — Z820 Family history of epilepsy and other diseases of the nervous system: Secondary | ICD-10-CM

## 2019-04-01 DIAGNOSIS — R413 Other amnesia: Secondary | ICD-10-CM | POA: Diagnosis not present

## 2019-04-01 DIAGNOSIS — R51 Headache: Secondary | ICD-10-CM

## 2019-04-01 DIAGNOSIS — R519 Headache, unspecified: Secondary | ICD-10-CM

## 2019-04-01 DIAGNOSIS — R351 Nocturia: Secondary | ICD-10-CM

## 2019-04-01 DIAGNOSIS — Z8659 Personal history of other mental and behavioral disorders: Secondary | ICD-10-CM

## 2019-04-04 NOTE — Progress Notes (Signed)
Please call patient regarding the recent brain MRI: The brain scan showed a normal structure of the brain and no significant volume loss or what we call atrophy. There were changes in the deeper structures of the brain, which we call white matter changes or microvascular changes. These were reported as minimal in Her case. These are tiny white spots, that occur with time and are seen in a variety of conditions, including with normal aging, chronic hypertension, chronic headaches, especially migraine HAs, chronic diabetes, chronic hyperlipidemia. These are not strokes and no mass or lesion or contrast enhancement was seen which is reassuring. Again, there were no acute findings, such as a stroke, or mass or blood products. Compared to 2017, her brain MRI findings are minimally changed, which is reassuring. No further action is required on this test at this time, other than re-enforcing the importance of good blood pressure control, good cholesterol control, good blood sugar control, and weight management. Please remind patient to keep any upcoming appointments or tests and to call us with any interim questions, concerns, problems or updates.  As discussed, I would like to proceed with a sleep study.  I ordered her study in June, please check with the sleep lab as to the status of scheduling this. Thanks,  Star Age, MD, PhD

## 2019-04-05 ENCOUNTER — Telehealth: Payer: Self-pay

## 2019-04-05 NOTE — Telephone Encounter (Signed)
I called pt, discussed her MRI brain results and recommendations. Pt reports that she did not receive any of our calls from the sleep lab but does want to schedule her sleep study. I will ask our sleep lab to try her again at the home/mobile number listed in her chart. Pt verbalized understanding of results. Pt had no questions at this time but was encouraged to call back if questions arise.

## 2019-04-05 NOTE — Telephone Encounter (Signed)
-----   Message from Star Age, MD sent at 04/04/2019  5:01 PM EDT ----- Please call patient regarding the recent brain MRI: The brain scan showed a normal structure of the brain and no significant volume loss or what we call atrophy. There were changes in the deeper structures of the brain, which we call white matter changes or microvascular changes. These were reported as minimal in Her case. These are tiny white spots, that occur with time and are seen in a variety of conditions, including with normal aging, chronic hypertension, chronic headaches, especially migraine HAs, chronic diabetes, chronic hyperlipidemia. These are not strokes and no mass or lesion or contrast enhancement was seen which is reassuring. Again, there were no acute findings, such as a stroke, or mass or blood products. Compared to 2017, her brain MRI findings are minimally changed, which is reassuring. No further action is required on this test at this time, other than re-enforcing the importance of good blood pressure control, good cholesterol control, good blood sugar control, and weight management. Please remind patient to keep any upcoming appointments or tests and to call us with any interim questions, concerns, problems or updates.  As discussed, I would like to proceed with a sleep study.  I ordered her study in June, please check with the sleep lab as to the status of scheduling this. Thanks,  Star Age, MD, PhD

## 2019-04-05 NOTE — Telephone Encounter (Signed)
Pt is scheduled for her sleep study on Sunday night, August 2 at 9pm

## 2019-04-24 ENCOUNTER — Ambulatory Visit (INDEPENDENT_AMBULATORY_CARE_PROVIDER_SITE_OTHER): Payer: Medicare Other | Admitting: Neurology

## 2019-04-24 DIAGNOSIS — G4720 Circadian rhythm sleep disorder, unspecified type: Secondary | ICD-10-CM

## 2019-04-24 DIAGNOSIS — R519 Headache, unspecified: Secondary | ICD-10-CM

## 2019-04-24 DIAGNOSIS — G4733 Obstructive sleep apnea (adult) (pediatric): Secondary | ICD-10-CM | POA: Diagnosis not present

## 2019-04-24 DIAGNOSIS — R413 Other amnesia: Secondary | ICD-10-CM | POA: Diagnosis not present

## 2019-04-24 DIAGNOSIS — Z820 Family history of epilepsy and other diseases of the nervous system: Secondary | ICD-10-CM

## 2019-04-24 DIAGNOSIS — Z8659 Personal history of other mental and behavioral disorders: Secondary | ICD-10-CM

## 2019-04-24 DIAGNOSIS — R351 Nocturia: Secondary | ICD-10-CM

## 2019-04-24 DIAGNOSIS — E669 Obesity, unspecified: Secondary | ICD-10-CM

## 2019-04-28 ENCOUNTER — Telehealth: Payer: Self-pay

## 2019-04-28 NOTE — Procedures (Signed)
PATIENT'S NAME:  Anna [name], Anna [name] DOB:      1946-04-14      MR#:    [id number]     DATE OF RECORDING: 04/24/2019 REFERRING M.D.:  Dustin Folks, NP Study Performed:   Baseline Polysomnogram HISTORY: 73 year old woman with a history of diabetes, hypertension, hyperlipidemia, glaucoma, reflux disease, arthritis, chronic kidney disease and obesity, who reports issues with short-term memory and depression. She endorses snoring and occasional morning headaches. The patient endorsed the Epworth Sleepiness Scale at 11/24 points. The patient's weight 182 pounds with a height of 62 (inches), resulting in a BMI of 33.7 kg/m2. The patient's neck circumference measured 15.5 inches.  CURRENT MEDICATIONS: Norvasc, Aspirin, Lipitor, Invokana, Flexeril, Dilaudid, Lantus, Protonix, Kenalog   PROCEDURE:  This is a multichannel digital polysomnogram utilizing the Somnostar 11.2 system.  Electrodes and sensors were applied and monitored per AASM Specifications.   EEG, EOG, Chin and Limb EMG, were sampled at 200 Hz.  ECG, Snore and Nasal Pressure, Thermal Airflow, Respiratory Effort, CPAP Flow and Pressure, Oximetry was sampled at 50 Hz. Digital video and audio were recorded.      BASELINE STUDY  Lights Out was at 00:03 and Lights On at 06:55.  Total recording time (TRT) was 412.5 minutes, with a total sleep time (TST) of 404.5 minutes.   The patient's sleep latency was 4 minutes.  REM latency was 116 minutes.  The sleep efficiency was 98.1 %.     SLEEP ARCHITECTURE: WASO (Wake after sleep onset) was 7 minutes with no significant sleep fragmentation noted.  There were 2.5 minutes in Stage N1, 307 minutes Stage N2, 12.5 minutes Stage N3 and 82.5 minutes in Stage REM.  The percentage of Stage N1 was .6%, Stage N2 was 75.9%, which is increased, Stage N3 was 3.1% and Stage R (REM sleep) was 20.4%, which is normal.   RESPIRATORY ANALYSIS:  There were a total of 340 respiratory events:  117 obstructive apneas, 7 central apneas  and 0 mixed apneas with a total of 124 apneas and an apnea index (AI) of 18.4 /hour. There were 216 hypopneas with a hypopnea index of 32. /hour. The patient also had 0 respiratory event related arousals (RERAs).      The total APNEA/HYPOPNEA INDEX (AHI) was 50.4 /hour and the total RESPIRATORY DISTURBANCE INDEX was 0. 50.4 /hour.  82 events occurred in REM sleep and 378 events in NREM. The REM AHI was 59.6 /hour, versus a non-REM AHI of 48.1. The patient spent 230 minutes of total sleep time in the supine position and 175 minutes in non-supine.. The supine AHI was 45.7 versus a non-supine AHI of 56.7.  OXYGEN SATURATION & C02:  The Wake baseline 02 saturation was 95%, with the lowest being in the 50s, some errors noted in the O2 sensor. Time spent below 89% saturation equaled 151 minutes.  PERIODIC LIMB MOVEMENTS: The patient had a total of 20 Periodic Limb Movements.  The Periodic Limb Movement (PLM) index was 3. and the PLM Arousal index was .9/hour. The arousals were noted as: 58 were spontaneous, 6 were associated with PLMs, 126 were associated with respiratory events.  Audio and video analysis did not show any abnormal or unusual movements, behaviors, phonations or vocalizations. The patient took no bathroom breaks. Moderate snoring was noted. The EKG was in keeping with normal sinus rhythm (NSR).  Post-study, the patient indicated that sleep was better than usual.   IMPRESSION:  1. Obstructive Sleep Apnea (OSA) 2. Dysfunctions associated with sleep stages  or arousal from sleep  RECOMMENDATIONS:  1. This study demonstrates severe obstructive sleep apnea, with a total AHI of 50.4/hour, REM AHI of 59.6/hour, supine AHI of 45.7/hour and O2 nadir in the 50s. Treatment with positive airway pressure in the form of CPAP is recommended. This will require a full night titration study to optimize therapy. Other treatment options may include (generally speaking) avoidance of supine sleep position along  with weight loss, upper airway or jaw surgery in selected patients or the use of an oral appliance in certain patients. ENT evaluation and/or consultation with a maxillofacial surgeon or dentist may be feasible in some instances. She will be advised to work on weight loss and avoid sleeping on her back.  2. Please note that untreated obstructive sleep apnea may carry additional perioperative morbidity. Patients with significant obstructive sleep apnea should receive perioperative PAP therapy and the surgeons and particularly the anesthesiologist should be informed of the diagnosis and the severity of the sleep disordered breathing. 3. This study shows some abnormal sleep stage percentages; these are nonspecific findings and per se do not signify an intrinsic sleep disorder or a cause for the patient's sleep-related symptoms. Causes include (but are not limited to) the first night effect of the sleep study, circadian rhythm disturbances, medication effect or an underlying mood disorder or medical problem.  4. The patient should be cautioned not to drive, work at heights, or operate dangerous or heavy equipment when tired or sleepy. Review and reiteration of good sleep hygiene measures should be pursued with any patient. 5. The patient will be seen in follow-up in the sleep clinic at [facility] for discussion of the test results, symptom and treatment compliance review, further management strategies, etc. The referring provider will be notified of the test results.  I certify that I have reviewed the entire raw data recording prior to the issuance of this report in accordance with the Standards of Accreditation of the American Academy of Sleep Medicine (AASM)  Star Age, MD, PhD Diplomat, American Board of Neurology and Sleep Medicine (Neurology and Sleep Medicine)

## 2019-04-28 NOTE — Telephone Encounter (Signed)
I called pt. I advised pt that Dr. Rexene Alberts reviewed their sleep study results and found that pt has severe and recommends that pt be treated with a cpap. Dr. Rexene Alberts recommends that pt return for a repeat sleep study in order to properly titrate the cpap and ensure a good mask fit. Pt is agreeable to returning for a titration study. I advised pt that our sleep lab will file with pt's insurance and call pt to schedule the sleep study when we hear back from the pt's insurance regarding coverage of this sleep study.   I advised pt to avoid supine sleep and work on [employer]. The sleep lab will work on expediting her sleep study.  Pt verbalized understanding of results. Pt had no questions at this time but was encouraged to call back if questions arise.

## 2019-04-28 NOTE — Telephone Encounter (Signed)
-----   Message from Star Age, MD sent at 04/28/2019  8:58 AM EDT ----- Patient referred by Dustin Folks, NP, seen by me on 03/09/19, diagnostic PSG on 04/24/19.   Please call and notify the patient that the recent sleep study showed severe obstructive sleep apnea. I recommend treatment for this in the form of CPAP. This will require a repeat sleep study for proper titration and mask fitting and correct monitoring of the oxygen saturations. Please explain to patient. I have placed an order in the chart.  As her sleep apnea events were more severe in supine sleep, pls advise her to sleep off her back and to work on weight management, which can reduce the severity of her OSA. I have asked sleep lab staff to see about expediting her study. Thanks.  Star Age, MD, PhD [facility])

## 2019-04-28 NOTE — Progress Notes (Signed)
Patient referred by Dustin Folks, NP, seen by me on 03/09/19, diagnostic PSG on 04/24/19.   Please call and notify the patient that the recent sleep study showed severe obstructive sleep apnea. I recommend treatment for this in the form of CPAP. This will require a repeat sleep study for proper titration and mask fitting and correct monitoring of the oxygen saturations. Please explain to patient. I have placed an order in the chart.  As her sleep apnea events were more severe in supine sleep, pls advise her to sleep off her back and to work on weight management, which can reduce the severity of her OSA. I have asked sleep lab staff to see about expediting her study. Thanks.  Star Age, MD, PhD [facility])

## 2019-04-28 NOTE — Addendum Note (Signed)
Addended by: Star Age on: 04/28/2019 08:58 AM   Modules accepted: Orders

## 2019-05-17 ENCOUNTER — Ambulatory Visit
Admission: RE | Admit: 2019-05-17 | Discharge: 2019-05-17 | Disposition: A | Discharge status: Home / Self Care | Payer: Medicare Other | Source: Ambulatory Visit | Attending: Internal Medicine | Admitting: Internal Medicine

## 2019-05-17 ENCOUNTER — Other Ambulatory Visit: Payer: Self-pay

## 2019-05-17 DIAGNOSIS — Z1231 Encounter for screening mammogram for malignant neoplasm of breast: Secondary | ICD-10-CM

## 2019-05-27 ENCOUNTER — Other Ambulatory Visit (HOSPITAL_COMMUNITY)
Admission: RE | Admit: 2019-05-27 | Discharge: 2019-05-27 | Disposition: A | Discharge status: Home / Self Care | Payer: Medicare Other | Source: Ambulatory Visit | Attending: Internal Medicine | Admitting: Internal Medicine

## 2019-05-27 DIAGNOSIS — Z20828 Contact with and (suspected) exposure to other viral communicable diseases: Secondary | ICD-10-CM | POA: Insufficient documentation

## 2019-05-27 DIAGNOSIS — Z01812 Encounter for preprocedural laboratory examination: Secondary | ICD-10-CM | POA: Insufficient documentation

## 2019-05-28 LAB — NOVEL CORONAVIRUS, NAA (HOSP ORDER, SEND-OUT TO REF LAB; TAT 18-24 HRS): SARS-CoV-2, NAA: NOT DETECTED

## 2019-05-31 ENCOUNTER — Ambulatory Visit (INDEPENDENT_AMBULATORY_CARE_PROVIDER_SITE_OTHER): Payer: Medicare Other | Admitting: Neurology

## 2019-05-31 DIAGNOSIS — E669 Obesity, unspecified: Secondary | ICD-10-CM

## 2019-05-31 DIAGNOSIS — Z820 Family history of epilepsy and other diseases of the nervous system: Secondary | ICD-10-CM

## 2019-05-31 DIAGNOSIS — Z8659 Personal history of other mental and behavioral disorders: Secondary | ICD-10-CM

## 2019-05-31 DIAGNOSIS — R351 Nocturia: Secondary | ICD-10-CM

## 2019-05-31 DIAGNOSIS — R519 Headache, unspecified: Secondary | ICD-10-CM

## 2019-05-31 DIAGNOSIS — G4733 Obstructive sleep apnea (adult) (pediatric): Secondary | ICD-10-CM

## 2019-05-31 DIAGNOSIS — G4720 Circadian rhythm sleep disorder, unspecified type: Secondary | ICD-10-CM

## 2019-05-31 DIAGNOSIS — R413 Other amnesia: Secondary | ICD-10-CM

## 2019-06-07 ENCOUNTER — Telehealth: Payer: Self-pay

## 2019-06-07 NOTE — Procedures (Signed)
PATIENT'S NAME:  Anna, [name] DOB:      March 20, 1946      MR#:    [id number]     DATE OF RECORDING: 05/31/2019 REFERRING M.D.:  Dustin Folks, NP  Study Performed:   CPAP  Titration HISTORY: 73 year old woman with a history of diabetes, hypertension, hyperlipidemia, glaucoma, reflux disease, arthritis, chronic kidney disease and obesity, who returns for a CPAP titration following her PSG performed on 04/24/19, which showed an AHI of 50.4 and low spo2 in the 50s. The patient endorsed the Epworth Sleepiness Scale at 11 points. BMI of 33.7 kg/m2. The patient's neck circumference measured 15.5 inches.  CURRENT MEDICATIONS: Norvasc, Aspirin, Lipitor, Invokana, Flexeril, Dilaudid, Lantus, Protonix, Kenalog  PROCEDURE:  This is a multichannel digital polysomnogram utilizing the SomnoStar 11.2 system.  Electrodes and sensors were applied and monitored per AASM Specifications.   EEG, EOG, Chin and Limb EMG, were sampled at 200 Hz.  ECG, Snore and Nasal Pressure, Thermal Airflow, Respiratory Effort, CPAP Flow and Pressure, Oximetry was sampled at 50 Hz. Digital video and audio were recorded.      The patient was fitted with a medium N20 nasal mask. CPAP was initiated at 5 cmH20 with heated humidity per AASM split night standards and pressure was advanced to 14 cm H20 because of snoring, hypopneas, apneas and desaturations.  At a PAP pressure of 14 cmH20, there was a reduction of the AHI to 0/hour with non-supine REM sleep achieved and O2 nadir of 91%.   Lights Out was at 21:57 and Lights On at 04:59. Total recording time (TRT) was 423 minutes, with a total sleep time (TST) of 360.5 minutes. The patient's sleep latency was 49.5 minutes, which is delayed. REM latency was 79.5 minutes, which is normal. The sleep efficiency was 85.2 %.    SLEEP ARCHITECTURE: WASO (Wake after sleep onset) was 13 minutes with mild sleep fragmentation noted.  There were 18.5 minutes in Stage N1, 132.5 minutes Stage N2, 129.5 minutes  Stage N3 and 80 minutes in Stage REM.  The percentage of Stage N1 was 5.1%, Stage N2 was 36.8%, which is reduced, Stage N3 was 35.9%, which is increased, and Stage R (REM sleep) was 22.2%, which is normal. The arousals were noted as: 16 were spontaneous, 0 were associated with PLMs, 0 were associated with respiratory events.  RESPIRATORY ANALYSIS:  There was a total of 0 respiratory events: 0 obstructive apneas, 0 central apneas and 0 mixed apneas with a total of 0 apneas and an apnea index (AI) of 0 /hour. There were 0 hypopneas with a hypopnea index of 0/hour. The patient also had 0 respiratory event related arousals (RERAs).      The total APNEA/HYPOPNEA INDEX  (AHI) was 0 /hour and the total RESPIRATORY DISTURBANCE INDEX was 0 /hour  0 events occurred in REM sleep and 0 events in NREM. The REM AHI was 0 /hour versus a non-REM AHI of 0 /hour.  The patient spent 65.5 minutes of total sleep time in the supine position and 295 minutes in non-supine. The supine AHI was 0.0, versus a non-supine AHI of 0.0.  OXYGEN SATURATION & C02:  The baseline 02 saturation was 94%, with the lowest being 86%. Time spent below 89% saturation equaled 4 minutes.  PERIODIC LIMB MOVEMENTS:  The patient had a total of 0 Periodic Limb Movements. The Periodic Limb Movement (PLM) index was 0 and the PLM Arousal index was 0 /hour.  Audio and video analysis did not show any abnormal  or unusual movements, behaviors, phonations or vocalizations. The patient took no bathroom breaks. The EKG was in keeping with normal sinus rhythm (NSR).  IMPRESSION:   1. Obstructive Sleep Apnea (OSA) 2. Dysfunctions associated with sleep stages or arousal from sleep   RECOMMENDATIONS:   1. This study demonstrates resolution of the patient's obstructive sleep apnea with CPAP therapy. I will, therefore, start the patient on home CPAP treatment at a pressure of 14 cm via medium nasal mask with heated humidity. The patient should be reminded to be  fully compliant with PAP therapy to improve sleep related symptoms and decrease long term cardiovascular risks. The patient should be reminded, that it may take up to 3 months to get fully used to using PAP with all planned sleep. The earlier full compliance is achieved, the better long term compliance tends to be. Please note that untreated obstructive sleep apnea may carry additional perioperative morbidity. Patients with significant obstructive sleep apnea should receive perioperative PAP therapy and the surgeons and particularly the anesthesiologist should be informed of the diagnosis and the severity of the sleep disordered breathing. 2. This study shows mild sleep fragmentation and mildly abnormal sleep stage percentages; these are nonspecific findings and per se do not signify an intrinsic sleep disorder or a cause for the patient's sleep-related symptoms. Causes include (but are not limited to) the first night effect of the sleep study, circadian rhythm disturbances, medication effect or an underlying mood disorder or medical problem.  3. The patient should be cautioned not to drive, work at heights, or operate dangerous or heavy equipment when tired or sleepy. Review and reiteration of good sleep hygiene measures should be pursued with any patient. 4. The patient will be seen in follow-up in the sleep clinic at [facility] for discussion of the test results, symptom and treatment compliance review, further management strategies, etc. The referring provider will be notified of the test results.   I certify that I have reviewed the entire raw data recording prior to the issuance of this report in accordance with the Standards of Accreditation of the American Academy of Sleep Medicine (AASM)   Star Age, MD, PhD Diplomat, American Board of Neurology and Sleep Medicine (Neurology and Sleep Medicine)

## 2019-06-07 NOTE — Telephone Encounter (Signed)
-----   Message from Star Age, MD sent at 06/07/2019  8:42 AM EDT ----- Patient referred by Dustin Folks, NP, seen by me on 03/09/19, diagnostic PSG on 04/24/19. Patient had a CPAP titration study on 05/31/19.  Please call and inform patient that I have entered an order for treatment with positive airway pressure (PAP) treatment for obstructive sleep apnea (OSA). She did well during the latest sleep study with CPAP. We will, therefore, arrange for a machine for home use through a DME (durable medical equipment) company of Anna [name] choice; and I will see the patient back in follow-up in about 10 weeks. Please also explain to the patient that I will be looking out for compliance data, which can be downloaded from the machine (stored on an SD card, that is inserted in the machine) or via remote access through a modem, that is built into the machine. At the time of the followup appointment we will discuss sleep study results and how it is going with PAP treatment at home. Please advise patient to bring Anna [name] machine at the time of the first FU visit, even though this is cumbersome. Bringing the machine for every visit after that will likely not be needed, but often helps for the first visit to troubleshoot if needed. Please re-enforce the importance of compliance with treatment and the need for [name] to monitor compliance data - often an insurance requirement and actually good feedback for the patient as far as how they are doing.  Also remind patient, that any interim PAP machine or mask issues should be first addressed with the DME company, as they can often help better with technical and mask fit issues. Please ask if patient has a preference regarding DME company.  Please also make sure, the patient has a follow-up appointment with me in about 10 weeks from the setup date, thanks. May see one of our nurse practitioners if needed for proper timing of the FU appointment.  Please fax or rout report to the referring provider.  Thanks,   Star Age, MD, PhD [facility])

## 2019-06-07 NOTE — Telephone Encounter (Signed)
I called pt. I advised pt that Dr. Rexene Alberts reviewed their sleep study results and found that pt did well with the cpap during her latest sleep study. Dr. Rexene Alberts recommends that pt start a cpap at home. I reviewed PAP compliance expectations with the pt. Pt is agreeable to starting a CPAP. I advised pt that an order will be sent to a DME, [facility], and [facility] will call the pt within about one week after they file with the pt's insurance. [facility] will show the pt how to use the machine, fit for masks, and troubleshoot the CPAP if needed. A follow up appt was made for insurance purposes with Amy, NP on Thursday, November 19th, 2020 at 1:30pm. Pt verbalized understanding to arrive 15 minutes early and bring their CPAP. A letter with all of this information in it will be mailed to the pt as a reminder. I verified with the pt that the address we have on file is correct. Pt verbalized understanding of results. Pt had no questions at this time but was encouraged to call back if questions arise. I have sent the order to [facility] and have received confirmation that they have received the order.

## 2019-06-07 NOTE — Addendum Note (Signed)
Addended by: Star Age on: 06/07/2019 08:42 AM   Modules accepted: Orders

## 2019-06-07 NOTE — Progress Notes (Signed)
Patient referred by Dustin Folks, NP, seen by me on 03/09/19, diagnostic PSG on 04/24/19. Patient had a CPAP titration study on 05/31/19.  Please call and inform patient that I have entered an order for treatment with positive airway pressure (PAP) treatment for obstructive sleep apnea (OSA). She did well during the latest sleep study with CPAP. We will, therefore, arrange for a machine for home use through a DME (durable medical equipment) company of Her choice; and I will see the patient back in follow-up in about 10 weeks. Please also explain to the patient that I will be looking out for compliance data, which can be downloaded from the machine (stored on an SD card, that is inserted in the machine) or via remote access through a modem, that is built into the machine. At the time of the followup appointment we will discuss sleep study results and how it is going with PAP treatment at home. Please advise patient to bring Her machine at the time of the first FU visit, even though this is cumbersome. Bringing the machine for every visit after that will likely not be needed, but often helps for the first visit to troubleshoot if needed. Please re-enforce the importance of compliance with treatment and the need for [name] to monitor compliance data - often an insurance requirement and actually good feedback for the patient as far as how they are doing.  Also remind patient, that any interim PAP machine or mask issues should be first addressed with the DME company, as they can often help better with technical and mask fit issues. Please ask if patient has a preference regarding DME company.  Please also make sure, the patient has a follow-up appointment with me in about 10 weeks from the setup date, thanks. May see one of our nurse practitioners if needed for proper timing of the FU appointment.  Please fax or rout report to the referring provider. Thanks,   Star Age, MD, PhD [facility])

## 2019-06-30 ENCOUNTER — Encounter: Payer: Self-pay | Admitting: Gastroenterology

## 2019-07-14 ENCOUNTER — Other Ambulatory Visit: Payer: Self-pay

## 2019-07-14 DIAGNOSIS — Z20822 Contact with and (suspected) exposure to covid-19: Secondary | ICD-10-CM

## 2019-07-16 LAB — NOVEL CORONAVIRUS, NAA: SARS-CoV-2, NAA: DETECTED — AB

## 2019-08-11 ENCOUNTER — Other Ambulatory Visit: Payer: Self-pay

## 2019-08-11 ENCOUNTER — Encounter: Payer: Self-pay | Admitting: Family Medicine

## 2019-08-11 ENCOUNTER — Ambulatory Visit (INDEPENDENT_AMBULATORY_CARE_PROVIDER_SITE_OTHER): Payer: Medicare Other | Admitting: Family Medicine

## 2019-08-11 VITALS — BP 142/80 | HR 74 | Temp 97.4°F | Ht 62.0 in | Wt 181.0 lb

## 2019-08-11 DIAGNOSIS — Z9989 Dependence on other enabling machines and devices: Secondary | ICD-10-CM | POA: Diagnosis not present

## 2019-08-11 DIAGNOSIS — G4733 Obstructive sleep apnea (adult) (pediatric): Secondary | ICD-10-CM | POA: Diagnosis not present

## 2019-08-11 NOTE — Progress Notes (Addendum)
PATIENT: Anna [name] DOB: [birth year]  REASON FOR VISIT: follow up HISTORY FROM: patient  Chief Complaint  Patient presents with   Follow-up    Room 6, with daughter. No concerns. CPAP works fine.     HISTORY OF PRESENT ILLNESS: Today 08/11/19 Anna [name] is a 73 y.o. female here today for follow up of memory loss and OSA on CPAP.  She was initially seen by Dr. Rexene Alberts in June 2020.  Memory loss felt to be complicated by depression and poor sleep.  She was started on Lexapro which has significantly improved depressive symptoms.  Sleep study revealed severe sleep apnea.  She was started on CPAP therapy. She reports doing well on therapy. On nights that she uses CPAP, she can tell a significant difference in sleep quality. She feels that memory concerns have improved. She is using a nasal mask. She has not noted a leak.   Compliance report dated 07/11/2019 through 08/09/2019 reveals that she use CPAP 16 of the last 30 days for compliance of 53%.  6 days she used CPAP greater than 4 hours for compliance of 20%.  Average usage was 3 hours and 49 minutes.  Residual AHI was 0.9 on 14 cm of water and an EPR of 3.  There was a leak noted in the 95th percentile of 29.9  HISTORY: (copied from Dr Guadelupe Sabin note on 03/09/2019)  Dear Anna [name],   I saw your patient, Anna [name], upon your kind request in my neurologic clinic today for initial consultation of her memory loss.  The patient is unaccompanied today. She missed an appointment on 03/07/2019 secondary to GI illness.  As you know, Anna [name] is a 73 year old right-handed woman with an underlying medical history of diabetes, hypertension, hyperlipidemia, glaucoma, reflux disease, arthritis, chronic kidney disease and obesity, who reports issues with short-term memory in the past several months.  She would forget conversations or misplace items.  She reports that she was also quite depressed at the same time, tearful on  having bouts of crying.  Her depression has significantly improved since she started Lexapro.  This was started in February.  She denies a family history of depression.  She had one half brother who was older and died about 30 years ago at age 66.  She has 4 daughters, 2 daughters have sleep apnea and uses CPAP machine.  She does not sleep well.  She endorses snoring and occasionally wakes up with a headache.  She has nocturia about once or twice per average night.  She goes to bed between 630 and 7 and typically watches TV until about 11.  Rise time varies, but mostly around 7.  She is busy, has close interaction with her family.  She has 7 grandchildren and 11 great grandchildren.  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  She has 1/10 grade education.  She quit smoking in 2019, she drinks alcohol occasionally, approximately 3 beers per weekend.  She is divorced and lives alone, but 2 of her daughters live in the same apartment building as her.  I reviewed your office note from 10/05/2018, as well as 11/05/2018.  She had lab work through your office on 08/16/2018 and I reviewed the results: Total cholesterol 206, triglycerides elevated at 343, LDL mildly elevated at 105, hepatitis screen was negative, folate normal, B12 1104, parathyroid hormone was 77 which is mildly elevated, vitamin D low at 10.  She had a brain MRI without contrast as well as  MRA head without contrast on 11/21/2015 and I reviewed the results: IMPRESSION: MRI head: No acute or significant finding. Normal except for a few tiny white matter foci not of any clinical relevance. MRA head: Normal evaluation of the large and medium size vessels. No stenosis or occlusion. She has recently tried a sleep aid, she cannot recall the name of it.  Overall, in the past 2 or 3 months she feels improved with regards to her memory.  She feels that her depression is much improved as well.  She does not feel is forgetful.  She does not drive, has never  driven. Her Epworth sleepiness score is 11 out of 24, fatigue severity score is 61 out of 63.   REVIEW OF SYSTEMS: Out of a complete 14 system review of symptoms, the patient complains only of the following symptoms, numbness and all other reviewed systems are negative.  Epworth sleepiness scale: 2 Fatigue Severity Scale: 39  ALLERGIES: Allergies  Allergen Reactions   Metformin     REACTION: GI upset - Diarrhea    HOME MEDICATIONS: Outpatient Medications Prior to Visit  Medication Sig Dispense Refill   amLODipine (NORVASC) 5 MG tablet Take 1 tablet (5 mg total) by mouth daily. 90 tablet 3   aspirin 81 MG tablet Take 1 tablet (81 mg total) by mouth daily. 30 tablet 11   atorvastatin (LIPITOR) 40 MG tablet Take 1 tablet (40 mg total) by mouth daily. 90 tablet 0   Blood Glucose Monitoring Suppl (ONE TOUCH ULTRA 2) w/Device KIT 1 Device by Does not apply route once. 1 each 0   canagliflozin (INVOKANA) 300 MG TABS tablet Take 1 tablet (300 mg total) by mouth daily before breakfast. 90 tablet 1   glucose blood (ONE TOUCH ULTRA TEST) test strip Check sugar 3-4 x daily as needed 200 each 3   Insulin Glargine (LANTUS SOLOSTAR) 100 UNIT/ML Solostar Pen INJECT 60 UNITS SUBCUTANEOUSLY ONCE DAILY 15 mL 0   Insulin Pen Needle (B-D UF III MINI PEN NEEDLES) 31G X 5 MM MISC Use with Solostar Lantus pen needles 100 each 5   ONETOUCH DELICA LANCETS 03E MISC Test blood glucose 3-4 times daily as needed. ICD-10 code: E11.40. 100 each 5   pantoprazole (PROTONIX) 40 MG tablet Take 1 tablet (40 mg total) by mouth daily. 30 tablet 1   quinapril-hydrochlorothiazide (ACCURETIC) 20-25 MG tablet TAKE 1 TABLET BY MOUTH ONCE DAILY 90 tablet 3   metoprolol succinate (TOPROL XL) 25 MG 24 hr tablet Take 1 tablet (25 mg total) by mouth daily. 90 tablet 3   cyclobenzaprine (FLEXERIL) 10 MG tablet Take 1 tablet (10 mg total) by mouth 2 (two) times daily as needed for muscle spasms. 20 tablet 0    HYDROmorphone (DILAUDID) 4 MG tablet Take 1 tablet (4 mg total) by mouth every 4 (four) hours as needed for severe pain. 30 tablet 0   triamcinolone cream (KENALOG) 0.1 % Apply 1 application topically 2 (two) times daily. 30 g 0   No facility-administered medications prior to visit.     PAST MEDICAL HISTORY: Past Medical History:  Diagnosis Date   Arthritis    Blood transfusion without reported diagnosis    40 plus yrs ago   Cataract    CKD (chronic kidney disease), stage III 08/10/2014   Diabetes mellitus    GERD (gastroesophageal reflux disease)    Glaucoma    Hyperlipidemia    Hypertension     PAST SURGICAL HISTORY: Past Surgical History:  Procedure  Laterality Date   ABDOMINAL HYSTERECTOMY     COLONOSCOPY     POLYPECTOMY      FAMILY HISTORY: Family History  Problem Relation Age of Onset   Diabetes Daughter    Hyperlipidemia Mother    Diabetes Mother    Cancer Brother    Hyperlipidemia Brother    Colon cancer Neg Hx    Colon polyps Neg Hx    Rectal cancer Neg Hx    Stomach cancer Neg Hx    Esophageal cancer Neg Hx    Breast cancer Neg Hx     SOCIAL HISTORY: Social History   Socioeconomic History   Marital status: Divorced    Spouse name: Not on file   Number of children: 4   Years of education: 10   Highest education level: Not on file  Occupational History   Occupation: Retired  Scientist, product/process development strain: Not on file   Food insecurity    Worry: Not on file    Inability: Not on Lexicographer needs    Medical: Not on file    Non-medical: Not on file  Tobacco Use   Smoking status: Former Smoker    Years: 30.00    Types: Cigarettes    Quit date: 10/09/2014    Years since quitting: 4.8   Smokeless tobacco: Never Used   Tobacco comment: 1 cig a week  Substance and Sexual Activity   Alcohol use: Yes    Alcohol/week: 1.0 standard drinks    Types: 1 Cans of beer per week    Comment:  occasional beer   Drug use: No   Sexual activity: Not on file  Lifestyle   Physical activity    Days per week: Not on file    Minutes per session: Not on file   Stress: Not on file  Relationships   Social connections    Talks on phone: Not on file    Gets together: Not on file    Attends religious service: Not on file    Active member of club or organization: Not on file    Attends meetings of clubs or organizations: Not on file    Relationship status: Not on file   Intimate partner violence    Fear of current or ex partner: Not on file    Emotionally abused: Not on file    Physically abused: Not on file    Forced sexual activity: Not on file  Other Topics Concern   Not on file  Social History Narrative   Health Care POA:    Emergency Contact: Makynna Manocchio (daughter) [phone]   End of Life Plan: Pt will bring copy of living will to next visit   Who lives with you: By herself   Any pets: 0   Diet: Pt reports recently reducing her intake of starches and carbohydrates. Pt reports eating more fresh fruits and vegetables to help reduce her A1C.     Exercise: Pt reports using the treadmill for 60 minutes, 2-3 times per week.    Seatbelts: Pt reports wearing a seatbelt when in vehicle.   Nancy Fetter Exposure/Protection: Pt denies wearing any protection but mentioned she will start due to side effects of her recently prescribed medication.    Hobbies: Reading and going to movies with family.      PHYSICAL EXAM  Vitals:   08/11/19 1319  BP: (!) 142/80  Pulse: 74  Temp: (!) 97.4 F (36.3 C)  Weight: 181 lb (  82.1 kg)  Height: _0  (1.575 m)   Body mass index is 33.11 kg/m.  Generalized: Well developed, in no acute distress  Cardiology: normal rate and rhythm, no murmur noted Respiratory: clear to auscultation bilaterally  Neurological examination  Mentation: Alert oriented to time, place, history taking. Follows all commands speech and language fluent Cranial nerve  II-XII: Pupils were equal round reactive to light. Extraocular movements were full, visual field were full on confrontational test.  Motor: The motor testing reveals 5 over 5 strength of all 4 extremities. Good symmetric motor tone is noted throughout.  Gait and station: Gait is normal.   DIAGNOSTIC DATA (LABS, IMAGING, TESTING) - I reviewed patient records, labs, notes, testing and imaging myself where available.  MMSE - Mini Mental State Exam 03/09/2019 03/08/2014  Orientation to time 5 4  Orientation to Place 4 4  Registration 3 3  Attention/ Calculation 4 5  Recall 3 3  Language- name 2 objects 2 2  Language- repeat 1 1  Language- follow 3 step command 3 3  Language- read & follow direction 1 1  Write a sentence 1 1  Copy design 1 0  Total score 28 27     Lab Results  Component Value Date   WBC 5.8 10/20/2016   HGB 11.9 (L) 10/20/2016   HCT 37.2 10/20/2016   MCV 85.3 10/20/2016   PLT 252 10/20/2016      Component Value Date/Time   NA 143 03/31/2018 1121   K 5.0 03/31/2018 1121   CL 103 03/31/2018 1121   CO2 27 03/31/2018 1121   GLUCOSE 186 (H) 03/31/2018 1121   GLUCOSE 188 (H) 10/20/2016 0858   BUN 23 03/31/2018 1121   CREATININE 1.34 (H) 03/31/2018 1121   CREATININE 1.02 (H) 06/06/2015 1452   CALCIUM 9.4 03/31/2018 1121   PROT 7.3 11/20/2015 2045   ALBUMIN 4.2 11/20/2015 2045   AST 19 11/20/2015 2045   ALT 18 11/20/2015 2045   ALKPHOS 97 11/20/2015 2045   BILITOT 0.5 11/20/2015 2045   GFRNONAA 40 (L) 03/31/2018 1121   GFRNONAA 42 (L) 06/12/2014 1150   GFRAA 47 (L) 03/31/2018 1121   GFRAA 49 (L) 06/12/2014 1150   Lab Results  Component Value Date   CHOL 183 11/21/2015   HDL 46 11/21/2015   LDLCALC 64 11/21/2015   LDLDIRECT 109 (H) 06/12/2014   TRIG 365 (H) 11/21/2015   CHOLHDL 4.0 11/21/2015   Lab Results  Component Value Date   HGBA1C 10.8 (A) 03/31/2018   No results found for: VITAMINB12 Lab Results  Component Value Date   TSH 1.972 11/20/2015     ASSESSMENT AND PLAN 73 y.o. year old female  has a past medical history of Arthritis, Blood transfusion without reported diagnosis, Cataract, CKD (chronic kidney disease), stage III (08/10/2014), Diabetes mellitus, GERD (gastroesophageal reflux disease), Glaucoma, Hyperlipidemia, and Hypertension. here with     ICD-10-CM   1. OSA on CPAP  G47.33    Z99.89     Overall Glennys is doing very well with CPAP therapy.  She continues to work on compliance.  Compliance report reveals suboptimal compliance at this time.  She does note significant improvement in sleep quality and has noted improvement in short-term memory since using CPAP.  She is motivated to continue using therapy.  We have discussed risk of untreated sleep apnea.  She was encouraged to continue using CPAP nightly and for greater than 4 hours each night.  I have also addressed concerns of  a leak.  She will monitor closely for this at home.  She will ensure that mask is well fitting and headgear is snug.  She is aware of how to assess for leak on her CPAP machine.  She will call us with any concerns of continued leak.  She will follow-up in 3 months, sooner if needed.  She verbalizes understanding and agreement with this plan.   No orders of the defined types were placed in this encounter.    No orders of the defined types were placed in this encounter.     I spent 15 minutes with the patient. 50% of this time was spent counseling and educating patient on plan of care and medications.    Debbora Presto, FNP-C 08/11/2019, 1:45 PM [facility] [address], [place], [place] [address] [phone]  I reviewed the above note and documentation by the Nurse Practitioner and agree with the history, exam, assessment and plan as outlined above. I was available for consultation. Star Age, MD, PhD [facility])

## 2019-08-11 NOTE — Patient Instructions (Signed)
Please continue to work towards a goal of nightly compliance with CPAP therapy.  I would like for you to use CPAP at least 4 hours every night.  Continue close follow-up with primary care for anxiety and depression as well as chronic disease management.  We will follow-up in 3 months for compliance review  Memory Compensation Strategies  1. Use "WARM" strategy.  W= write it down  A= associate it  R= repeat it  M= make a mental note  2.   You can keep a Social worker.  Use a 3-ring notebook with sections for the following: calendar, important names and phone numbers,  medications, doctors' names/phone numbers, lists/reminders, and a section to journal what you did  each day.   3.    Use a calendar to write appointments down.  4.    Write yourself a schedule for the day.  This can be placed on the calendar or in a separate section of the Memory Notebook.  Keeping a  regular schedule can help memory.  5.    Use medication organizer with sections for each day or morning/evening pills.  You may need help loading it  6.    Keep a basket, or pegboard by the door.  Place items that you need to take out with you in the basket or on the pegboard.  You may also want to  include a message board for reminders.  7.    Use sticky notes.  Place sticky notes with reminders in a place where the task is performed.  For example: " turn off the  stove" placed by the stove, "lock the door" placed on the door at eye level, " take your medications" on  the bathroom mirror or by the place where you normally take your medications.  8.    Use alarms/timers.  Use while cooking to remind yourself to check on food or as a reminder to take your medicine, or as a  reminder to make a call, or as a reminder to perform another task, etc.    Sleep Apnea Sleep apnea affects breathing during sleep. It causes breathing to stop for a short time or to become shallow. It can also increase the risk of:  Heart  attack.  Stroke.  Being very overweight (obese).  Diabetes.  Heart failure.  Irregular heartbeat. The goal of treatment is to help you breathe normally again. What are the causes? There are three kinds of sleep apnea:  Obstructive sleep apnea. This is caused by a blocked or collapsed airway.  Central sleep apnea. This happens when the brain does not send the right signals to the muscles that control breathing.  Mixed sleep apnea. This is a combination of obstructive and central sleep apnea. The most common cause of this condition is a collapsed or blocked airway. This can happen if:  Your throat muscles are too relaxed.  Your tongue and tonsils are too large.  You are overweight.  Your airway is too small. What increases the risk?  Being overweight.  Smoking.  Having a small airway.  Being older.  Being female.  Drinking alcohol.  Taking medicines to calm yourself (sedatives or tranquilizers).  Having family members with the condition. What are the signs or symptoms?  Trouble staying asleep.  Being sleepy or tired during the day.  Getting angry a lot.  Loud snoring.  Headaches in the morning.  Not being able to focus your mind (concentrate).  Forgetting things.  Less interest in  sex.  Mood swings.  Personality changes.  Feelings of sadness (depression).  Waking up a lot during the night to pee (urinate).  Dry mouth.  Sore throat. How is this diagnosed?  Your medical history.  A physical exam.  A test that is done when you are sleeping (sleep study). The test is most often done in a sleep lab but may also be done at home. How is this treated?   Sleeping on your side.  Using a medicine to get rid of mucus in your nose (decongestant).  Avoiding the use of alcohol, medicines to help you relax, or certain pain medicines (narcotics).  Losing weight, if needed.  Changing your diet.  Not smoking.  Using a machine to open your  airway while you sleep, such as: ? An oral appliance. This is a mouthpiece that shifts your lower jaw forward. ? A CPAP device. This device blows air through a mask when you breathe out (exhale). ? An EPAP device. This has valves that you put in each nostril. ? A BPAP device. This device blows air through a mask when you breathe in (inhale) and breathe out.  Having surgery if other treatments do not work. It is important to get treatment for sleep apnea. Without treatment, it can lead to:  High blood pressure.  Coronary artery disease.  In men, not being able to have an erection (impotence).  Reduced thinking ability. Follow these instructions at home: Lifestyle  Make changes that your doctor recommends.  Eat a healthy diet.  Lose weight if needed.  Avoid alcohol, medicines to help you relax, and some pain medicines.  Do not use any products that contain nicotine or tobacco, such as cigarettes, e-cigarettes, and chewing tobacco. If you need help quitting, ask your doctor. General instructions  Take over-the-counter and prescription medicines only as told by your doctor.  If you were given a machine to use while you sleep, use it only as told by your doctor.  If you are having surgery, make sure to tell your doctor you have sleep apnea. You may need to bring your device with you.  Keep all follow-up visits as told by your doctor. This is important. Contact a doctor if:  The machine that you were given to use during sleep bothers you or does not seem to be working.  You do not get better.  You get worse. Get help right away if:  Your chest hurts.  You have trouble breathing in enough air.  You have an uncomfortable feeling in your back, arms, or stomach.  You have trouble talking.  One side of your body feels weak.  A part of your face is hanging down. These symptoms may be an emergency. Do not wait to see if the symptoms will go away. Get medical help right  away. Call your local emergency services (911 in the [place] Do not drive yourself to the hospital. Summary  This condition affects breathing during sleep.  The most common cause is a collapsed or blocked airway.  The goal of treatment is to help you breathe normally while you sleep. This information is not intended to replace advice given to you by your health care provider. Make sure you discuss any questions you have with your health care provider. Document Released: 06/17/2008 Document Revised: 06/25/2018 Document Reviewed: 05/04/2018 Elsevier Patient Education  2020 [employer].

## 2019-12-19 ENCOUNTER — Other Ambulatory Visit: Payer: Self-pay

## 2019-12-19 ENCOUNTER — Ambulatory Visit
Admission: RE | Admit: 2019-12-19 | Discharge: 2019-12-19 | Disposition: A | Discharge status: Home / Self Care | Payer: Medicare Other | Source: Ambulatory Visit | Attending: Family | Admitting: Family

## 2019-12-19 ENCOUNTER — Other Ambulatory Visit: Payer: Self-pay | Admitting: Family

## 2019-12-19 DIAGNOSIS — M25512 Pain in left shoulder: Secondary | ICD-10-CM

## 2020-02-07 ENCOUNTER — Other Ambulatory Visit: Payer: Self-pay | Admitting: Nephrology

## 2020-02-07 DIAGNOSIS — N1832 Chronic kidney disease, stage 3b: Secondary | ICD-10-CM

## 2020-02-16 ENCOUNTER — Other Ambulatory Visit: Payer: Medicare Other

## 2020-02-22 ENCOUNTER — Ambulatory Visit
Admission: RE | Admit: 2020-02-22 | Discharge: 2020-02-22 | Disposition: A | Discharge status: Home / Self Care | Payer: Medicare (Managed Care) | Source: Ambulatory Visit | Attending: Nephrology | Admitting: Nephrology

## 2020-02-22 DIAGNOSIS — N1832 Chronic kidney disease, stage 3b: Secondary | ICD-10-CM

## 2020-05-15 ENCOUNTER — Other Ambulatory Visit (HOSPITAL_COMMUNITY): Payer: Self-pay | Admitting: Nephrology

## 2020-05-15 DIAGNOSIS — R809 Proteinuria, unspecified: Secondary | ICD-10-CM

## 2020-05-17 ENCOUNTER — Other Ambulatory Visit: Payer: Self-pay | Admitting: Radiology

## 2020-05-21 ENCOUNTER — Other Ambulatory Visit: Payer: Self-pay | Admitting: Radiology

## 2020-05-22 ENCOUNTER — Other Ambulatory Visit: Payer: Self-pay

## 2020-05-22 ENCOUNTER — Encounter (HOSPITAL_COMMUNITY): Payer: Self-pay

## 2020-05-22 ENCOUNTER — Ambulatory Visit (HOSPITAL_COMMUNITY)
Admission: RE | Admit: 2020-05-22 | Discharge: 2020-05-22 | Disposition: A | Discharge status: Home / Self Care | Payer: Medicare Other | Source: Ambulatory Visit | Attending: Nephrology | Admitting: Nephrology

## 2020-05-22 DIAGNOSIS — Z7982 Long term (current) use of aspirin: Secondary | ICD-10-CM | POA: Insufficient documentation

## 2020-05-22 DIAGNOSIS — M1990 Unspecified osteoarthritis, unspecified site: Secondary | ICD-10-CM | POA: Insufficient documentation

## 2020-05-22 DIAGNOSIS — Z83438 Family history of other disorder of lipoprotein metabolism and other lipidemia: Secondary | ICD-10-CM | POA: Insufficient documentation

## 2020-05-22 DIAGNOSIS — E1122 Type 2 diabetes mellitus with diabetic chronic kidney disease: Secondary | ICD-10-CM | POA: Insufficient documentation

## 2020-05-22 DIAGNOSIS — I129 Hypertensive chronic kidney disease with stage 1 through stage 4 chronic kidney disease, or unspecified chronic kidney disease: Secondary | ICD-10-CM | POA: Insufficient documentation

## 2020-05-22 DIAGNOSIS — Z79899 Other long term (current) drug therapy: Secondary | ICD-10-CM | POA: Diagnosis not present

## 2020-05-22 DIAGNOSIS — Z794 Long term (current) use of insulin: Secondary | ICD-10-CM | POA: Insufficient documentation

## 2020-05-22 DIAGNOSIS — R809 Proteinuria, unspecified: Secondary | ICD-10-CM | POA: Diagnosis present

## 2020-05-22 DIAGNOSIS — K219 Gastro-esophageal reflux disease without esophagitis: Secondary | ICD-10-CM | POA: Diagnosis not present

## 2020-05-22 DIAGNOSIS — E785 Hyperlipidemia, unspecified: Secondary | ICD-10-CM | POA: Diagnosis not present

## 2020-05-22 DIAGNOSIS — N183 Chronic kidney disease, stage 3 unspecified: Secondary | ICD-10-CM | POA: Insufficient documentation

## 2020-05-22 DIAGNOSIS — Z809 Family history of malignant neoplasm, unspecified: Secondary | ICD-10-CM | POA: Diagnosis not present

## 2020-05-22 DIAGNOSIS — Z833 Family history of diabetes mellitus: Secondary | ICD-10-CM | POA: Insufficient documentation

## 2020-05-22 DIAGNOSIS — Z87891 Personal history of nicotine dependence: Secondary | ICD-10-CM | POA: Insufficient documentation

## 2020-05-22 DIAGNOSIS — I7090 Unspecified atherosclerosis: Secondary | ICD-10-CM | POA: Insufficient documentation

## 2020-05-22 DIAGNOSIS — N12 Tubulo-interstitial nephritis, not specified as acute or chronic: Secondary | ICD-10-CM | POA: Insufficient documentation

## 2020-05-22 LAB — PROTIME-INR
INR: 0.9 (ref 0.8–1.2)
Prothrombin Time: 12 seconds (ref 11.4–15.2)

## 2020-05-22 LAB — CBC
HCT: 39.1 % (ref 36.0–46.0)
Hemoglobin: 11.8 g/dL — ABNORMAL LOW (ref 12.0–15.0)
MCH: 26.6 pg (ref 26.0–34.0)
MCHC: 30.2 g/dL (ref 30.0–36.0)
MCV: 88.3 fL (ref 80.0–100.0)
Platelets: 257 10*3/uL (ref 150–400)
RBC: 4.43 MIL/uL (ref 3.87–5.11)
RDW: 15.2 % (ref 11.5–15.5)
WBC: 8.1 10*3/uL (ref 4.0–10.5)
nRBC: 0 % (ref 0.0–0.2)

## 2020-05-22 LAB — GLUCOSE, CAPILLARY: Glucose-Capillary: 104 mg/dL — ABNORMAL HIGH (ref 70–99)

## 2020-05-22 MED ORDER — GELATIN ABSORBABLE 12-7 MM EX MISC
CUTANEOUS | Status: AC
  Filled 2020-05-22: qty 1

## 2020-05-22 MED ORDER — SODIUM CHLORIDE 0.9 % IV SOLN: INTRAVENOUS | Status: DC

## 2020-05-22 MED ORDER — HYDROCODONE-ACETAMINOPHEN 5-325 MG PO TABS: 1.0000 | ORAL_TABLET | ORAL | Status: DC | PRN

## 2020-05-22 MED ORDER — FENTANYL CITRATE (PF) 100 MCG/2ML IJ SOLN
INTRAMUSCULAR | Status: AC
  Filled 2020-05-22: qty 2

## 2020-05-22 MED ORDER — MIDAZOLAM HCL 2 MG/2ML IJ SOLN
INTRAMUSCULAR | Status: AC
  Filled 2020-05-22: qty 2

## 2020-05-22 MED ORDER — MIDAZOLAM HCL 2 MG/2ML IJ SOLN
INTRAMUSCULAR | Status: DC | PRN
  Administered 2020-05-22: 1 mg via INTRAVENOUS

## 2020-05-22 MED ORDER — FENTANYL CITRATE (PF) 100 MCG/2ML IJ SOLN
INTRAMUSCULAR | Status: DC | PRN
Start: 2020-05-22 — End: 2020-05-23
  Administered 2020-05-22: 50 ug via INTRAVENOUS

## 2020-05-22 MED ORDER — LIDOCAINE HCL (PF) 1 % IJ SOLN
INTRAMUSCULAR | Status: AC
  Filled 2020-05-22: qty 30

## 2020-05-22 NOTE — H&P (Signed)
Chief Complaint: Patient was seen in consultation today for a random renal biopsy.  Referring Physician(s): Claudia Desanctis  Supervising Physician: Jacqulynn Cadet  Patient Status: [facility] - Out-pt  History of Present Illness: Anna [name] is a 74 y.o. female with a medical history significant for hypertension, obesity, DM and chronic kidney disease stage III. Recent lab work ordered by her nephrology team showed nephrotic range proteinuria.   Interventional Radiology has been asked to evaluate this patient for an image-guided random renal biopsy for further work-up and diagnosis.    Past Medical History:  Diagnosis Date  . Arthritis   . Blood transfusion without reported diagnosis    40 plus yrs ago  . Cataract   . CKD (chronic kidney disease), stage III 08/10/2014  . Diabetes mellitus   . GERD (gastroesophageal reflux disease)   . Glaucoma   . Hyperlipidemia   . Hypertension     Past Surgical History:  Procedure Laterality Date  . ABDOMINAL HYSTERECTOMY    . COLONOSCOPY    . POLYPECTOMY      Allergies: Metformin  Medications: Prior to Admission medications   Medication Sig Start Date End Date Taking? Authorizing Provider  acetaminophen (TYLENOL) 500 MG tablet Take 500-1,000 mg by mouth every 6 (six) hours as needed for moderate pain.   Yes [provider]  acetaminophen (TYLENOL) 650 MG CR tablet Take 1,300 mg by mouth every 8 (eight) hours as needed (arthritis).   Yes [provider]  amLODipine (NORVASC) 10 MG tablet Take 10 mg by mouth daily.   Yes [provider]  aspirin 81 MG tablet Take 1 tablet (81 mg total) by mouth daily. 02/24/17  Yes Diallo, Abdoulaye, MD  atorvastatin (LIPITOR) 40 MG tablet Take 1 tablet (40 mg total) by mouth daily. 09/30/17  Yes Diallo, Abdoulaye, MD  dorzolamide-timolol (COSOPT) 22.3-6.8 MG/ML ophthalmic solution Place 1 drop into both eyes 2 (two) times daily. 04/11/20  Yes [provider]   escitalopram (LEXAPRO) 10 MG tablet Take 10 mg by mouth daily.   Yes [provider]  gabapentin (NEURONTIN) 100 MG capsule Take 100 mg by mouth 3 (three) times daily.   Yes [provider]  lubiprostone (AMITIZA) 24 MCG capsule Take 24 mcg by mouth 2 (two) times daily.   Yes [provider]  Melatonin 10 MG CAPS Take 10 mg by mouth at bedtime as needed (sleep).   Yes [provider]  metoprolol tartrate (LOPRESSOR) 25 MG tablet Take 25 mg by mouth 2 (two) times daily.   Yes [provider]  pantoprazole (PROTONIX) 40 MG tablet Take 1 tablet (40 mg total) by mouth daily. 01/25/18  Yes Diallo, Abdoulaye, MD  quinapril (ACCUPRIL) 20 MG tablet Take 20 mg by mouth daily.   Yes [provider]  Vitamin D, Ergocalciferol, (DRISDOL) 1.25 MG (50000 UNIT) CAPS capsule Take 50,000 Units by mouth every Thursday.   Yes [provider]  amLODipine (NORVASC) 5 MG tablet Take 1 tablet (5 mg total) by mouth daily. Patient not taking: Reported on 05/17/2020 07/16/18 05/17/20  Marjie Skiff, MD  Blood Glucose Monitoring Suppl (ONE TOUCH ULTRA 2) w/Device KIT 1 Device by Does not apply route once. 11/29/15   Olam Idler, MD  canagliflozin (INVOKANA) 300 MG TABS tablet Take 1 tablet (300 mg total) by mouth daily before breakfast. Patient not taking: Reported on 05/17/2020 03/04/17   Diallo, Earna Coder, MD  glucose blood (ONE TOUCH ULTRA TEST) test strip Check sugar 3-4  x daily as needed 02/24/17   Diallo, Earna Coder, MD  Insulin Glargine (LANTUS SOLOSTAR) 100 UNIT/ML Solostar Pen INJECT 60 UNITS SUBCUTANEOUSLY ONCE DAILY Patient taking differently: Inject 70 Units into the skin daily.  02/10/19   Diallo, Earna Coder, MD  Insulin Pen Needle (B-D UF III MINI PEN NEEDLES) 31G X 5 MM MISC Use with Solostar Lantus pen needles 02/24/17   Diallo, Abdoulaye, MD  ONETOUCH DELICA LANCETS 62B MISC Test blood glucose 3-4 times daily as needed. ICD-10 code: E11.40. 02/25/17    Marjie Skiff, MD  quinapril-hydrochlorothiazide (ACCURETIC) 20-25 MG tablet TAKE 1 TABLET BY MOUTH ONCE DAILY Patient not taking: Reported on 05/17/2020 04/12/18   Marjie Skiff, MD     Family History  Problem Relation Age of Onset  . Diabetes Daughter   . Hyperlipidemia Mother   . Diabetes Mother   . Cancer Brother   . Hyperlipidemia Brother   . Colon cancer Neg Hx   . Colon polyps Neg Hx   . Rectal cancer Neg Hx   . Stomach cancer Neg Hx   . Esophageal cancer Neg Hx   . Breast cancer Neg Hx     Social History   Socioeconomic History  . Marital status: Divorced    Spouse name: Not on file  . Number of children: 4  . Years of education: 10  . Highest education level: Not on file  Occupational History  . Occupation: Retired  Tobacco Use  . Smoking status: Former Smoker    Years: 30.00    Types: Cigarettes    Quit date: 10/09/2014    Years since quitting: 5.6  . Smokeless tobacco: Never Used  . Tobacco comment: 1 cig a week  Vaping Use  . Vaping Use: Never used  Substance and Sexual Activity  . Alcohol use: Yes    Alcohol/week: 1.0 standard drink    Types: 1 Cans of beer per week    Comment: occasional beer  . Drug use: No  . Sexual activity: Not on file  Other Topics Concern  . Not on file  Social History Narrative   Health Care POA:    Emergency Contact: Ebony Yorio (daughter) [phone]   End of Life Plan: Pt will bring copy of living will to next visit   Who lives with you: By herself   Any pets: 0   Diet: Pt reports recently reducing her intake of starches and carbohydrates. Pt reports eating more fresh fruits and vegetables to help reduce her A1C.     Exercise: Pt reports using the treadmill for 60 minutes, 2-3 times per week.    Seatbelts: Pt reports wearing a seatbelt when in vehicle.   Nancy Fetter Exposure/Protection: Pt denies wearing any protection but mentioned she will start due to side effects of her recently prescribed medication.    Hobbies:  Reading and going to movies with family.   Social Determinants of Health   Financial Resource Strain:   . Difficulty of Paying Living Expenses: Not on file  Food Insecurity:   . Worried About Charity fundraiser in the Last Year: Not on file  . Ran Out of Food in the Last Year: Not on file  Transportation Needs:   . Lack of Transportation (Medical): Not on file  . Lack of Transportation (Non-Medical): Not on file  Physical Activity:   . Days of Exercise per Week: Not on file  . Minutes of Exercise per Session: Not on file  Stress:   . Feeling of Stress :  Not on file  Social Connections:   . Frequency of Communication with Friends and Family: Not on file  . Frequency of Social Gatherings with Friends and Family: Not on file  . Attends Religious Services: Not on file  . Active Member of Clubs or Organizations: Not on file  . Attends Archivist Meetings: Not on file  . Marital Status: Not on file    Review of Systems: A 12 point ROS discussed and pertinent positives are indicated in the HPI above.  All other systems are negative.  Review of Systems  Constitutional: Negative for appetite change and fatigue.  Respiratory: Negative for cough and shortness of breath.   Cardiovascular: Negative for chest pain and leg swelling.  Gastrointestinal: Negative for abdominal pain, diarrhea, nausea and vomiting.  Genitourinary: Positive for urgency. Negative for difficulty urinating.  Musculoskeletal: Positive for back pain.       Intermittent  Neurological: Negative for headaches.    Vital Signs: BP 122/67 (BP Location: Left Arm)   Pulse 62   Temp 97.7 F (36.5 C) (Oral)   Resp 13   Ht 5' 2" (1.575 m)   Wt 185 lb (83.9 kg)   SpO2 98%   BMI 33.84 kg/m   Physical Exam Constitutional:      General: She is not in acute distress. HENT:     Mouth/Throat:     Mouth: Mucous membranes are moist.     Pharynx: Oropharynx is clear.  Cardiovascular:     Rate and Rhythm:  Normal rate and regular rhythm.     Pulses: Normal pulses.     Heart sounds: Normal heart sounds.  Pulmonary:     Effort: Pulmonary effort is normal.     Breath sounds: Normal breath sounds.  Abdominal:     General: Bowel sounds are normal.     Palpations: Abdomen is soft.  Musculoskeletal:        General: Normal range of motion.     Cervical back: Normal range of motion.  Skin:    General: Skin is warm and dry.  Neurological:     Mental Status: She is alert and oriented to person, place, and time.     Imaging: No results found.  Labs:  CBC: Recent Labs    05/22/20 0615  WBC 8.1  HGB 11.8*  HCT 39.1  PLT 257    COAGS: Recent Labs    05/22/20 0615  INR 0.9    BMP: No results for input(s): NA, K, CL, CO2, GLUCOSE, BUN, CALCIUM, CREATININE, GFRNONAA, GFRAA in the last 8760 hours.  Invalid input(s): CMP  LIVER FUNCTION TESTS: No results for input(s): BILITOT, AST, ALT, ALKPHOS, PROT, ALBUMIN in the last 8760 hours.  TUMOR MARKERS: No results for input(s): AFPTM, CEA, CA199, CHROMGRNA in the last 8760 hours.  Assessment and Plan:  Chronic kidney disease, Stage III; nephrotic range proteinuria: Marguarite Arbour, 74 year old female, presents today to the [facility] Interventional Radiology department for an image-guided random renal biopsy.   Risks and benefits of a random renal biopsy were discussed with the patient including, but not limited to bleeding, infection, damage to adjacent structures or low yield requiring additional tests.  All of the questions were answered and there is agreement to proceed.  She has been  NPO. Labs and vitals have been reviewed. She does not take any blood-thinning medications.   Consent signed and in chart.  Thank you for this interesting consult.  I greatly enjoyed meeting Tokiko Womack  Bolin and look forward to participating in their care.  A copy of this report was sent to the requesting provider on this  date.  Electronically Signed: Soyla Dryer, AGACNP-BC [phone] 05/22/2020, 7:41 AM   I spent a total of  30 Minutes   in face to face in clinical consultation, greater than 50% of which was counseling/coordinating care for random renal biopsy.

## 2020-05-22 NOTE — Procedures (Signed)
Interventional Radiology Procedure Note  Procedure: Random biopsy of LEFT kidney  Complications: None immediate  Estimated Blood Loss: < 2mL  Recommendations: - Bedrest x 6 hrs - DC home if no evidence of bleeding   Signed,  Criselda Peaches, MD

## 2020-05-22 NOTE — Discharge Instructions (Addendum)
Percutaneous Kidney Biopsy, Care After This sheet gives you information about how to care for yourself after your procedure. Your health care provider may also give you more specific instructions. If you have problems or questions, contact your health care provider. What can I expect after the procedure? After the procedure, it is common to have:  Pain or soreness near the biopsy site.  Pink or cloudy urine for 24 hours after the procedure. Follow these instructions at home: Activity  Return to your normal activities as told by your health care provider. Ask your health care provider what activities are safe for you.  If you were given a sedative during the procedure, it can affect you for several hours. Do not drive or operate machinery until your health care provider says that it is safe.  Do not lift anything that is heavier than 10 lb (4.5 kg), or the limit that you are told, until your health care provider says that it is safe.  Avoid activities that take a lot of effort (are strenuous) until your health care provider approves. Most people will have to wait 2 weeks before returning to activities such as exercise or sex. General instructions   Take over-the-counter and prescription medicines only as told by your health care provider.  You may eat and drink after your procedure. Follow instructions from your health care provider about eating or drinking restrictions.  Check your biopsy site every day for signs of infection. Check for: ? More redness, swelling, or pain. ? Fluid or blood. ? Warmth. ? Pus or a bad smell.  Keep all follow-up visits as told by your health care provider. This is important. Contact a health care provider if:  You have more redness, swelling, or pain around your biopsy site.  You have fluid or blood coming from your biopsy site.  Your biopsy site feels warm to the touch.  You have pus or a bad smell coming from your biopsy site.  You have blood  in your urine more than 24 hours after your procedure.  You have a fever. Get help right away if:  Your urine is dark red or brown.  You cannot urinate.  It burns when you urinate.  You feel dizzy or light-headed.  You have severe pain in your abdomen or side. Summary  After the procedure, it is common to have pain or soreness at the biopsy site and pink or cloudy urine for the first 24 hours.  Check your biopsy site each day for signs of infection, such as more redness, swelling, or pain; fluid, blood, pus or a bad smell coming from the biopsy site; or the biopsy site feeling warm to touch.  Return to your normal activities as told by your health care provider. This information is not intended to replace advice given to you by your health care provider. Make sure you discuss any questions you have with your health care provider. Document Revised: 05/12/2019 Document Reviewed: 05/12/2019 Elsevier Patient Education  2020 Elsevier Inc. Moderate Conscious Sedation, Adult Sedation is the use of medicines to promote relaxation and relieve discomfort and anxiety. Moderate conscious sedation is a type of sedation. Under moderate conscious sedation, you are less alert than normal, but you are still able to respond to instructions, touch, or both. Moderate conscious sedation is used during short medical and dental procedures. It is milder than deep sedation, which is a type of sedation under which you cannot be easily woken up. It is also milder than   general anesthesia, which is the use of medicines to make you unconscious. Moderate conscious sedation allows you to return to your regular activities sooner. Tell a health care provider about:  Any allergies you have.  All medicines you are taking, including vitamins, herbs, eye drops, creams, and over-the-counter medicines.  Use of steroids (by mouth or creams).  Any problems you or family members have had with sedatives and anesthetic  medicines.  Any blood disorders you have.  Any surgeries you have had.  Any medical conditions you have, such as sleep apnea.  Whether you are pregnant or may be pregnant.  Any use of cigarettes, alcohol, marijuana, or street drugs. What are the risks? Generally, this is a safe procedure. However, problems may occur, including:  Getting too much medicine (oversedation).  Nausea.  Allergic reaction to medicines.  Trouble breathing. If this happens, a breathing tube may be used to help with breathing. It will be removed when you are awake and breathing on your own.  Heart trouble.  Lung trouble. What happens before the procedure? Staying hydrated Follow instructions from your health care provider about hydration, which may include:  Up to 2 hours before the procedure - you may continue to drink clear liquids, such as water, clear fruit juice, black coffee, and plain tea. Eating and drinking restrictions Follow instructions from your health care provider about eating and drinking, which may include:  8 hours before the procedure - stop eating heavy meals or foods such as meat, fried foods, or fatty foods.  6 hours before the procedure - stop eating light meals or foods, such as toast or cereal.  6 hours before the procedure - stop drinking milk or drinks that contain milk.  2 hours before the procedure - stop drinking clear liquids. Medicine Ask your health care provider about:  Changing or stopping your regular medicines. This is especially important if you are taking diabetes medicines or blood thinners.  Taking medicines such as aspirin and ibuprofen. These medicines can thin your blood. Do not take these medicines before your procedure if your health care provider instructs you not to.  Tests and exams  You will have a physical exam.  You may have blood tests done to show: ? How well your kidneys and liver are working. ? How well your blood can clot. General  instructions  Plan to have someone take you home from the hospital or clinic.  If you will be going home right after the procedure, plan to have someone with you for 24 hours. What happens during the procedure?  An IV tube will be inserted into one of your veins.  Medicine to help you relax (sedative) will be given through the IV tube.  The medical or dental procedure will be performed. What happens after the procedure?  Your blood pressure, heart rate, breathing rate, and blood oxygen level will be monitored often until the medicines you were given have worn off.  Do not drive for 24 hours. This information is not intended to replace advice given to you by your health care provider. Make sure you discuss any questions you have with your health care provider. Document Revised: 08/21/2017 Document Reviewed: 12/29/2015 Elsevier Patient Education  2020 Elsevier Inc.  

## 2020-06-12 LAB — SURGICAL PATHOLOGY

## 2020-06-14 ENCOUNTER — Encounter (HOSPITAL_COMMUNITY): Payer: Self-pay | Admitting: Nephrology

## 2020-07-21 ENCOUNTER — Ambulatory Visit: Payer: Medicare Other

## 2020-08-24 ENCOUNTER — Emergency Department (HOSPITAL_COMMUNITY)
Admission: EM | Admit: 2020-08-24 | Discharge: 2020-08-24 | Disposition: A | Discharge status: Home / Self Care | Payer: Medicare Other | Attending: Emergency Medicine | Admitting: Emergency Medicine

## 2020-08-24 DIAGNOSIS — R519 Headache, unspecified: Secondary | ICD-10-CM | POA: Diagnosis present

## 2020-08-24 DIAGNOSIS — Z5321 Procedure and treatment not carried out due to patient leaving prior to being seen by health care provider: Secondary | ICD-10-CM | POA: Diagnosis not present

## 2020-08-24 DIAGNOSIS — W228XXA Striking against or struck by other objects, initial encounter: Secondary | ICD-10-CM | POA: Diagnosis not present

## 2020-08-24 NOTE — ED Notes (Signed)
Pt sts she is leaving and going home
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
## 2020-12-16 ENCOUNTER — Emergency Department (HOSPITAL_COMMUNITY)
Admission: EM | Admit: 2020-12-16 | Discharge: 2020-12-16 | Disposition: A | Discharge status: Home / Self Care | Payer: Medicare Other | Attending: Emergency Medicine | Admitting: Emergency Medicine

## 2020-12-16 ENCOUNTER — Other Ambulatory Visit: Payer: Self-pay

## 2020-12-16 ENCOUNTER — Emergency Department (HOSPITAL_COMMUNITY): Payer: Medicare Other

## 2020-12-16 DIAGNOSIS — Z7982 Long term (current) use of aspirin: Secondary | ICD-10-CM | POA: Insufficient documentation

## 2020-12-16 DIAGNOSIS — Z20822 Contact with and (suspected) exposure to covid-19: Secondary | ICD-10-CM | POA: Diagnosis not present

## 2020-12-16 DIAGNOSIS — R4182 Altered mental status, unspecified: Secondary | ICD-10-CM | POA: Diagnosis present

## 2020-12-16 DIAGNOSIS — E11649 Type 2 diabetes mellitus with hypoglycemia without coma: Secondary | ICD-10-CM | POA: Insufficient documentation

## 2020-12-16 DIAGNOSIS — Z79899 Other long term (current) drug therapy: Secondary | ICD-10-CM | POA: Insufficient documentation

## 2020-12-16 DIAGNOSIS — Z8673 Personal history of transient ischemic attack (TIA), and cerebral infarction without residual deficits: Secondary | ICD-10-CM | POA: Insufficient documentation

## 2020-12-16 DIAGNOSIS — Z794 Long term (current) use of insulin: Secondary | ICD-10-CM | POA: Diagnosis not present

## 2020-12-16 DIAGNOSIS — E1122 Type 2 diabetes mellitus with diabetic chronic kidney disease: Secondary | ICD-10-CM | POA: Insufficient documentation

## 2020-12-16 DIAGNOSIS — Z87891 Personal history of nicotine dependence: Secondary | ICD-10-CM | POA: Insufficient documentation

## 2020-12-16 DIAGNOSIS — E162 Hypoglycemia, unspecified: Secondary | ICD-10-CM

## 2020-12-16 DIAGNOSIS — N184 Chronic kidney disease, stage 4 (severe): Secondary | ICD-10-CM | POA: Insufficient documentation

## 2020-12-16 DIAGNOSIS — I129 Hypertensive chronic kidney disease with stage 1 through stage 4 chronic kidney disease, or unspecified chronic kidney disease: Secondary | ICD-10-CM | POA: Insufficient documentation

## 2020-12-16 DIAGNOSIS — E1140 Type 2 diabetes mellitus with diabetic neuropathy, unspecified: Secondary | ICD-10-CM | POA: Insufficient documentation

## 2020-12-16 LAB — DIFFERENTIAL
Abs Immature Granulocytes: 0.03 10*3/uL (ref 0.00–0.07)
Basophils Absolute: 0 10*3/uL (ref 0.0–0.1)
Basophils Relative: 0 %
Eosinophils Absolute: 0 10*3/uL (ref 0.0–0.5)
Eosinophils Relative: 0 %
Immature Granulocytes: 0 %
Lymphocytes Relative: 18 %
Lymphs Abs: 1.2 10*3/uL (ref 0.7–4.0)
Monocytes Absolute: 0.4 10*3/uL (ref 0.1–1.0)
Monocytes Relative: 7 %
Neutro Abs: 5 10*3/uL (ref 1.7–7.7)
Neutrophils Relative %: 75 %

## 2020-12-16 LAB — I-STAT CHEM 8, ED
BUN: 45 mg/dL — ABNORMAL HIGH (ref 8–23)
Calcium, Ion: 1.12 mmol/L — ABNORMAL LOW (ref 1.15–1.40)
Chloride: 106 mmol/L (ref 98–111)
Creatinine, Ser: 2.3 mg/dL — ABNORMAL HIGH (ref 0.44–1.00)
Glucose, Bld: 93 mg/dL (ref 70–99)
HCT: 35 % — ABNORMAL LOW (ref 36.0–46.0)
Hemoglobin: 11.9 g/dL — ABNORMAL LOW (ref 12.0–15.0)
Potassium: 4.7 mmol/L (ref 3.5–5.1)
Sodium: 141 mmol/L (ref 135–145)
TCO2: 26 mmol/L (ref 22–32)

## 2020-12-16 LAB — COMPREHENSIVE METABOLIC PANEL
ALT: 21 U/L (ref 0–44)
AST: 25 U/L (ref 15–41)
Albumin: 2.8 g/dL — ABNORMAL LOW (ref 3.5–5.0)
Alkaline Phosphatase: 82 U/L (ref 38–126)
Anion gap: 6 (ref 5–15)
BUN: 36 mg/dL — ABNORMAL HIGH (ref 8–23)
CO2: 25 mmol/L (ref 22–32)
Calcium: 8.7 mg/dL — ABNORMAL LOW (ref 8.9–10.3)
Chloride: 108 mmol/L (ref 98–111)
Creatinine, Ser: 2.34 mg/dL — ABNORMAL HIGH (ref 0.44–1.00)
GFR, Estimated: 22 mL/min — ABNORMAL LOW (ref >60)
Glucose, Bld: 100 mg/dL — ABNORMAL HIGH (ref 70–99)
Potassium: 4.7 mmol/L (ref 3.5–5.1)
Sodium: 139 mmol/L (ref 135–145)
Total Bilirubin: 0.3 mg/dL (ref 0.3–1.2)
Total Protein: 5.9 g/dL — ABNORMAL LOW (ref 6.5–8.1)

## 2020-12-16 LAB — CBG MONITORING, ED
Glucose-Capillary: 80 mg/dL (ref 70–99)
Glucose-Capillary: 71 mg/dL (ref 70–99)
Glucose-Capillary: 176 mg/dL — ABNORMAL HIGH (ref 70–99)
Glucose-Capillary: 71 mg/dL (ref 70–99)

## 2020-12-16 LAB — RESP PANEL BY RT-PCR (FLU A&B, COVID) ARPGX2
Influenza A by PCR: NEGATIVE
Influenza B by PCR: NEGATIVE
SARS Coronavirus 2 by RT PCR: NEGATIVE

## 2020-12-16 LAB — PROTIME-INR
INR: 0.9 (ref 0.8–1.2)
Prothrombin Time: 11.6 seconds (ref 11.4–15.2)

## 2020-12-16 LAB — CBC
HCT: 37.9 % (ref 36.0–46.0)
Hemoglobin: 11.6 g/dL — ABNORMAL LOW (ref 12.0–15.0)
MCH: 28 pg (ref 26.0–34.0)
MCHC: 30.6 g/dL (ref 30.0–36.0)
MCV: 91.3 fL (ref 80.0–100.0)
Platelets: 262 10*3/uL (ref 150–400)
RBC: 4.15 MIL/uL (ref 3.87–5.11)
RDW: 14.2 % (ref 11.5–15.5)
WBC: 6.7 10*3/uL (ref 4.0–10.5)
nRBC: 0 % (ref 0.0–0.2)

## 2020-12-16 LAB — APTT: aPTT: 30 seconds (ref 24–36)

## 2020-12-16 LAB — ETHANOL: Alcohol, Ethyl (B): <10 mg/dL (ref <10)

## 2020-12-16 MED ORDER — SODIUM CHLORIDE 0.9 % IV BOLUS
500.0000 mL | Freq: Once | INTRAVENOUS | Status: AC
  Administered 2020-12-16: 500 mL via INTRAVENOUS

## 2020-12-16 MED ORDER — SODIUM CHLORIDE 0.9 % IV SOLN: 100.0000 mL/h | INTRAVENOUS | Status: DC

## 2020-12-16 NOTE — ED Notes (Signed)
The pt wants to go home  She wants the report of her mri  Was informed that the edp has to get the report called to him and will talk to her  Now c/o lt knee pain  And she wants something for pain

## 2020-12-16 NOTE — ED Notes (Addendum)
CBG collected. Result "[address] RN, [place], and EDP, [place], notified.

## 2020-12-16 NOTE — ED Notes (Signed)
Patient transported to MRI 

## 2020-12-16 NOTE — ED Provider Notes (Signed)
[facility] EMERGENCY DEPARTMENT Provider Note   CSN: [id number] Arrival date & time: 12/16/20  1017     History Chief Complaint  Patient presents with  . Altered Mental Status    Anna [name] is a 75 y.o. female.  Pt presents to the ED today with AMS.  The pt was talking to 1 of her daughters this morning and felt like she was not speaking correctly and was slurring her words.  Another daughter who lives with her said pt was shaking and could not remember the names of her grandchildren.  EMS was called.  When they arrived, her bs was 47.  Family gave pt a pb sandwich.  Pt's bs up to 82.  Pt has not taken any of her meds this am and had not eaten prior to her episode.  Pt said she's had trouble with her bs dropping this week.  She denies any medication changes or changes to her diet or to exercise level.  Pt has had a TIA in the past and is worried about that.          Past Medical History:  Diagnosis Date  . Arthritis   . Blood transfusion without reported diagnosis    40 plus yrs ago  . Cataract   . CKD (chronic kidney disease), stage III 08/10/2014  . Diabetes mellitus   . GERD (gastroesophageal reflux disease)   . Glaucoma   . Hyperlipidemia   . Hypertension     Patient Active Problem List   Diagnosis Date Noted  . Plantar warts 06/17/2016  . Benign essential HTN   . Cranial nerve VI palsy   . 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 Diplopia 11/20/2015  . Abdominal pain, epigastric 03/02/2015  . TIA (transient ischemic attack) 10/25/2014  . CKD (chronic kidney disease), stage III ([place]) 08/10/2014  . HLD (hyperlipidemia) 06/12/2014  . Right shoulder pain 06/12/2014  . Depression 12/03/2012  . History of tobacco use 06/09/2009  . VAGINITIS, ATROPHIC 10/25/2008  . COLONIC POLYPS 01/21/2007  . Diabetes mellitus with diabetic neuropathy, with long-term current use of insulin ([place]) 11/19/2006  . OBESITY, NOS  11/19/2006  . GLAUCOMA 11/19/2006  . CATARACT 11/19/2006  . HYPERTENSION, BENIGN SYSTEMIC 11/19/2006  . DIVERTICULOSIS OF COLON 11/19/2006  . PROTEINURIA 11/19/2006    Past Surgical History:  Procedure Laterality Date  . ABDOMINAL HYSTERECTOMY    . COLONOSCOPY    . POLYPECTOMY       OB History   No obstetric history on file.     Family History  Problem Relation Age of Onset  . Diabetes Daughter   . Hyperlipidemia Mother   . Diabetes Mother   . Cancer Brother   . Hyperlipidemia Brother   . Colon cancer Neg Hx   . Colon polyps Neg Hx   . Rectal cancer Neg Hx   . Stomach cancer Neg Hx   . Esophageal cancer Neg Hx   . Breast cancer Neg Hx     Social History   Tobacco Use  . Smoking status: Former Smoker    Years: 30.00    Types: Cigarettes    Quit date: 10/09/2014    Years since quitting: 6.1  . Smokeless tobacco: Never Used  . Tobacco comment: 1 cig a week  Vaping Use  . Vaping Use: Never used  Substance Use Topics  . Alcohol use: Yes    Alcohol/week: 1.0 standard drink  Types: 1 Cans of beer per week    Comment: occasional beer  . Drug use: No    Home Medications Prior to Admission medications   Medication Sig Start Date End Date Taking? Authorizing Provider  acetaminophen (TYLENOL) 500 MG tablet Take 500-1,000 mg by mouth every 6 (six) hours as needed for moderate pain or mild pain.   Yes [provider]  amLODipine (NORVASC) 10 MG tablet Take 10 mg by mouth daily.   Yes [provider]  aspirin 81 MG tablet Take 1 tablet (81 mg total) by mouth daily. 02/24/17  Yes Diallo, Abdoulaye, MD  atorvastatin (LIPITOR) 40 MG tablet Take 1 tablet (40 mg total) by mouth daily. 09/30/17  Yes Diallo, Abdoulaye, MD  Cholecalciferol (VITAMIN D-3) 25 MCG (1000 UT) CAPS Take 1,000 Units by mouth daily.   Yes [provider]  escitalopram (LEXAPRO) 10 MG tablet Take 10 mg by mouth daily.   Yes [provider]  gabapentin (NEURONTIN) 100 MG  capsule Take 100 mg by mouth 3 (three) times daily as needed (for neuropathy).   Yes [provider]  HYDROcodone-acetaminophen (NORCO/VICODIN) 5-325 MG tablet Take 1 tablet by mouth every 8 (eight) hours as needed (for pain). 11/23/20  Yes [provider]  Insulin Glargine (LANTUS SOLOSTAR) 100 UNIT/ML Solostar Pen INJECT 60 UNITS SUBCUTANEOUSLY ONCE DAILY Patient taking differently: Inject 70 Units into the skin daily before breakfast. 02/10/19  Yes Diallo, Abdoulaye, MD  lubiprostone (AMITIZA) 24 MCG capsule Take 24 mcg by mouth daily with breakfast.   Yes [provider]  Melatonin 10 MG CAPS Take 10 mg by mouth at bedtime as needed (sleep).   Yes [provider]  metoprolol tartrate (LOPRESSOR) 25 MG tablet Take 25 mg by mouth 2 (two) times daily.   Yes [provider]  pantoprazole (PROTONIX) 40 MG tablet Take 1 tablet (40 mg total) by mouth daily. Patient taking differently: Take 40 mg by mouth daily as needed (for heartburn). 01/25/18  Yes Diallo, Abdoulaye, MD  quinapril (ACCUPRIL) 20 MG tablet Take 20 mg by mouth daily.   Yes [provider]  amLODipine (NORVASC) 5 MG tablet Take 1 tablet (5 mg total) by mouth daily. Patient not taking: Reported on 12/16/2020 07/16/18 05/17/20  Marjie Skiff, MD  Blood Glucose Monitoring Suppl (ONE TOUCH ULTRA 2) w/Device KIT 1 Device by Does not apply route once. 11/29/15   Olam Idler, MD  canagliflozin (INVOKANA) 300 MG TABS tablet Take 1 tablet (300 mg total) by mouth daily before breakfast. Patient not taking: Reported on 12/16/2020 03/04/17   Marjie Skiff, MD  dorzolamide-timolol (COSOPT) 22.3-6.8 MG/ML ophthalmic solution Place 1 drop into both eyes 2 (two) times daily. Patient not taking: Reported on 12/16/2020 04/11/20   [provider]  glucose blood (ONE TOUCH ULTRA TEST) test strip Check sugar 3-4 x daily as needed 02/24/17   Diallo, Earna Coder, MD  Insulin Pen Needle (B-D UF III MINI  PEN NEEDLES) 31G X 5 MM MISC Use with Solostar Lantus pen needles 02/24/17   Diallo, Abdoulaye, MD  ONETOUCH DELICA LANCETS 55D MISC Test blood glucose 3-4 times daily as needed. ICD-10 code: E11.40. 02/25/17   Marjie Skiff, MD  quinapril-hydrochlorothiazide (ACCURETIC) 20-25 MG tablet TAKE 1 TABLET BY MOUTH ONCE DAILY Patient not taking: Reported on 12/16/2020 04/12/18   Marjie Skiff, MD    Allergies    Metformin  Review of Systems   Review of Systems  Neurological: Positive for tremors and weakness.  All  other systems reviewed and are negative.   Physical Exam Updated Vital Signs BP (!) 153/76   Pulse 73   Temp 98 F (36.7 C) (Oral)   Resp 20   Ht '5\' 2"'  (1.575 m)   Wt 84.4 kg   SpO2 94%   BMI 34.02 kg/m   Physical Exam Vitals and nursing note reviewed.  Constitutional:      Appearance: Normal appearance. She is obese.  HENT:     Head: Normocephalic and atraumatic.     Right Ear: External ear normal.     Left Ear: External ear normal.     Nose: Nose normal.     Mouth/Throat:     Mouth: Mucous membranes are moist.     Pharynx: Oropharynx is clear.  Eyes:     Extraocular Movements: Extraocular movements intact.     Conjunctiva/sclera: Conjunctivae normal.     Pupils: Pupils are equal, round, and reactive to light.  Cardiovascular:     Rate and Rhythm: Normal rate and regular rhythm.     Pulses: Normal pulses.     Heart sounds: Normal heart sounds.  Pulmonary:     Effort: Pulmonary effort is normal.     Breath sounds: Normal breath sounds.  Abdominal:     General: Abdomen is flat. Bowel sounds are normal.     Palpations: Abdomen is soft.  Musculoskeletal:        General: Normal range of motion.     Cervical back: Normal range of motion and neck supple.  Skin:    General: Skin is warm.     Capillary Refill: Capillary refill takes less than 2 seconds.  Neurological:     General: No focal deficit present.     Mental Status: She is alert and oriented to  person, place, and time.  Psychiatric:        Mood and Affect: Mood normal.        Behavior: Behavior normal.        Thought Content: Thought content normal.        Judgment: Judgment normal.     ED Results / Procedures / Treatments   Labs (all labs ordered are listed, but only abnormal results are displayed) Labs Reviewed  CBC - Abnormal; Notable for the following components:      Result Value   Hemoglobin 11.6 (*)    All other components within normal limits  COMPREHENSIVE METABOLIC PANEL - Abnormal; Notable for the following components:   Glucose, Bld 100 (*)    BUN 36 (*)    Creatinine, Ser 2.34 (*)    Calcium 8.7 (*)    Total Protein 5.9 (*)    Albumin 2.8 (*)    GFR, Estimated 22 (*)    All other components within normal limits  I-STAT CHEM 8, ED - Abnormal; Notable for the following components:   BUN 45 (*)    Creatinine, Ser 2.30 (*)    Calcium, Ion 1.12 (*)    Hemoglobin 11.9 (*)    HCT 35.0 (*)    All other components within normal limits  CBG MONITORING, ED - Abnormal; Notable for the following components:   Glucose-Capillary 176 (*)    All other components within normal limits  RESP PANEL BY RT-PCR (FLU A&B, COVID) [id number]  ETHANOL  PROTIME-INR  APTT  DIFFERENTIAL  RAPID URINE DRUG SCREEN, HOSP PERFORMED  URINALYSIS, ROUTINE W REFLEX MICROSCOPIC  CBG MONITORING, ED  CBG MONITORING, ED    EKG EKG Interpretation  Date/Time:  Sunday December 16 2020 10:21:29 EDT Ventricular Rate:  71 PR Interval:    QRS Duration: 93 QT Interval:  416 QTC Calculation: 453 R Axis:   40 Text Interpretation: Sinus rhythm Atrial premature complex No significant change since last tracing Confirmed by Isla Pence [phone]) on 12/16/2020 10:41:33 AM   Radiology CT HEAD WO CONTRAST  Result Date: 12/16/2020 CLINICAL DATA:  75 year old female with acute altered mental status and confusion. EXAM: CT HEAD WITHOUT CONTRAST TECHNIQUE: Contiguous axial images were obtained from  the base of the skull through the vertex without intravenous contrast. COMPARISON:  04/01/2019 brain MR, 11/20/2015 CT FINDINGS: Brain: No evidence of acute infarction, hemorrhage, hydrocephalus, extra-axial collection or mass lesion/mass effect. Vascular: No hyperdense vessel or unexpected calcification. Skull: Normal. Negative for fracture or focal lesion. Sinuses/Orbits: No acute finding. Other: None. IMPRESSION: Unremarkable noncontrast head CT. Electronically Signed   By: Margarette Canada M.D.   On: 12/16/2020 11:26    Procedures Procedures   Medications Ordered in ED Medications  sodium chloride 0.9 % bolus 500 mL (500 mLs Intravenous New Bag/Given 12/16/20 1111)    Followed by  0.9 %  sodium chloride infusion (100 mL/hr Intravenous Not Given 12/16/20 1514)    ED Course  I have reviewed the triage vital signs and the nursing notes.  Pertinent labs & imaging results that were available during my care of the patient were reviewed by me and considered in my medical decision making (see chart for details).    MDM Rules/Calculators/A&P                          CT is nl.  Pt has ckd.  Her blood sugar has been in the 70s and 80s today.  Pt only takes Lantus for her blood sugar and she last took it yesterday morning.  I think pt's sx are due to hypoglycemia as it was in the 40s when she had her sx.  I ordered a MRI to make sure she has not had a stroke because the family was very concerned.  I am going to have her decrease her dose of lantus and then f/u with pcp if MRI is nl.  Pt signed out to Dr. Langston Masker at shift change.  Final Clinical Impression(s) / ED Diagnoses Final diagnoses:  CKD (chronic kidney disease) stage 4, GFR 15-29 ml/min ([place])  Hypoglycemia    Rx / DC Orders ED Discharge Orders    None       Isla Pence, MD 12/16/20 1516

## 2020-12-16 NOTE — Discharge Instructions (Addendum)
Decrease lantus to 35 units daily.  Check your blood sugar prior to taking.  If it is under 70, then do not take the lantus.

## 2020-12-16 NOTE — ED Notes (Signed)
CBG collected. Result "79." RN, Gerald Stabs, notified.

## 2020-12-16 NOTE — ED Triage Notes (Signed)
Pt & family report [phone] last night

## 2020-12-16 NOTE — ED Triage Notes (Signed)
Pt arrived to ED via [facility]. EMS reports family called EMS after discovering pt with AMS at approx 0930 this morning. Family reports confusion and speech abnormality. Pt was hypoglycemic at 47 on FD and EMS arrival, however AMS and abnormal speech had subsided and pt was caox4. Pt was able to eat / drink which increased CBG to 82. Pt reports hx of diabetes and multiple TIA's. Pt reports s/s inconsistent with previous hypoglycemic events and that her sugar has been dropping more frequently the past week. Pt presents to ED caox4, in no obvious distress, denying any current complaints.

## 2020-12-16 NOTE — ED Notes (Signed)
Patient transported to CT 

## 2020-12-17 LAB — CBG MONITORING, ED: Glucose-Capillary: 83 mg/dL (ref 70–99)

## 2021-01-09 ENCOUNTER — Telehealth: Payer: Self-pay

## 2021-01-09 NOTE — Telephone Encounter (Signed)
contacted pt regarding appt. Tomorrow to see if she is using CPAP, LVM asking her to give me a call back.

## 2021-01-09 NOTE — Progress Notes (Signed)
PATIENT: Anna [name] DOB: December 03, 1945  REASON FOR VISIT: follow up HISTORY FROM: patient  Chief Complaint  Patient presents with  . Follow-up    RM 1  with daughter (angel) Pt is well, daughter has concerns for not being treated of memory lost, stopped CPAP herself.      HISTORY OF PRESENT ILLNESS: 01/10/21 ALL: She returns for follow up for memory loss and OSA on CPAP. Sleep study in 04/2019 confirmed severe OSA with AHI 50/hr, REM AHI 59.6/hr and O2 nadir of 50%. She was started on CPAP and memory loss felt to be related to sleep apnea and depression. Last follow up in 07/2019 showed memory stable and she was struggling to meet compliance with CPAP. She reports returning her CPAP machine at the request of the DME due to non compliance. Since, she has continued to have a difficult time sleeping. She wakes multiple times during the night. She sleeps a lot during the day. Her daughter and PCP continue to have concerns of depression. She has recently learned that she has kidney failure. She was seen by ER for AMS due to hypoglycemia (47) on 12/16/20. She has been talking with her family and is concerned about her health. She has decided that she would like to resume CPAP therapy. She did note improvement in how she was sleeping and understands health risks of untreated sleep apnea. She was previous on 14cmH20 and using medium nasal mask.    08/11/2019 ALL:  Anna [name] is a 75 y.o. female here today for follow up of memory loss and OSA on CPAP.  She was initially seen by Dr. Rexene Alberts in June 2020.  Memory loss felt to be complicated by depression and poor sleep.  She was started on Lexapro which has significantly improved depressive symptoms.  Sleep study revealed severe sleep apnea.  She was started on CPAP therapy. She reports doing well on therapy. On nights that she uses CPAP, she can tell a significant difference in sleep quality. She feels that memory concerns have  improved. She is using a nasal mask. She has not noted a leak.   Compliance report dated 07/11/2019 through 08/09/2019 reveals that she use CPAP 16 of the last 30 days for compliance of 53%.  6 days she used CPAP greater than 4 hours for compliance of 20%.  Average usage was 3 hours and 49 minutes.  Residual AHI was 0.9 on 14 cm of water and an EPR of 3.  There was a leak noted in the 95th percentile of 29.9  HISTORY: (copied from Dr Guadelupe Sabin note on 03/09/2019)  Dear Anna [name],   I saw your patient, Anna [name], upon your kind request in my neurologic clinic today for initial consultation of her memory loss.  The patient is unaccompanied today. She missed an appointment on 03/07/2019 secondary to GI illness.  As you know, Ms. Oshima is a 75 year old right-handed woman with an underlying medical history of diabetes, hypertension, hyperlipidemia, glaucoma, reflux disease, arthritis, chronic kidney disease and obesity, who reports issues with short-term memory in the past several months.  She would forget conversations or misplace items.  She reports that she was also quite depressed at the same time, tearful on having bouts of crying.  Her depression has significantly improved since she started Lexapro.  This was started in February.  She denies a family history of depression.  She had one half brother who was older and died about 81 years ago at age 37.  She has 4 daughters, 2 daughters have sleep apnea and uses CPAP machine.  She does not sleep well.  She endorses snoring and occasionally wakes up with a headache.  She has nocturia about once or twice per average night.  She goes to bed between 630 and 7 and typically watches TV until about 11.  Rise time varies, but mostly around 7.  She is busy, has close interaction with her family.  She has 7 grandchildren and 11 great grandchildren.  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  She has 1/10 grade education.  She quit smoking in 2019, she drinks  alcohol occasionally, approximately 3 beers per weekend.  She is divorced and lives alone, but 2 of her daughters live in the same apartment building as her.  I reviewed your office note from 10/05/2018, as well as 11/05/2018.  She had lab work through your office on 08/16/2018 and I reviewed the results: Total cholesterol 206, triglycerides elevated at 343, LDL mildly elevated at 105, hepatitis screen was negative, folate normal, B12 1104, parathyroid hormone was 77 which is mildly elevated, vitamin D low at 10.  She had a brain MRI without contrast as well as MRA head without contrast on 11/21/2015 and I reviewed the results: IMPRESSION: MRI head: No acute or significant finding. Normal except for a few tiny white matter foci not of any clinical relevance. MRA head: Normal evaluation of the large and medium size vessels. No stenosis or occlusion. She has recently tried a sleep aid, she cannot recall the name of it.  Overall, in the past 2 or 3 months she feels improved with regards to her memory.  She feels that her depression is much improved as well.  She does not feel is forgetful.  She does not drive, has never driven. Her Epworth sleepiness score is 11 out of 24, fatigue severity score is 61 out of 63.   REVIEW OF SYSTEMS: Out of a complete 14 system review of symptoms, the patient complains only of the following symptoms, depression, fatigue, daytime sleepiness, numbness and all other reviewed systems are negative.  Epworth sleepiness scale: 11   ALLERGIES: Allergies  Allergen Reactions  . Metformin Diarrhea and Other (See Comments)    GI upset     HOME MEDICATIONS: Outpatient Medications Prior to Visit  Medication Sig Dispense Refill  . acetaminophen (TYLENOL) 500 MG tablet Take 500-1,000 mg by mouth every 6 (six) hours as needed for moderate pain or mild pain.    Marland Kitchen aspirin 81 MG tablet Take 1 tablet (81 mg total) by mouth daily. 30 tablet 11  . atorvastatin (LIPITOR) 40 MG tablet Take  1 tablet (40 mg total) by mouth daily. 90 tablet 0  . Blood Glucose Monitoring Suppl (ONE TOUCH ULTRA 2) w/Device KIT 1 Device by Does not apply route once. 1 each 0  . canagliflozin (INVOKANA) 300 MG TABS tablet Take 1 tablet (300 mg total) by mouth daily before breakfast. 90 tablet 1  . Cholecalciferol (VITAMIN D-3) 25 MCG (1000 UT) CAPS Take 1,000 Units by mouth daily.    . dorzolamide-timolol (COSOPT) 22.3-6.8 MG/ML ophthalmic solution Place 1 drop into both eyes 2 (two) times daily.    Marland Kitchen escitalopram (LEXAPRO) 10 MG tablet Take 10 mg by mouth daily.    Marland Kitchen gabapentin (NEURONTIN) 100 MG capsule Take 100 mg by mouth 3 (three) times daily as needed (for neuropathy).    Marland Kitchen glucose blood (ONE TOUCH ULTRA TEST) test strip Check sugar  3-4 x daily as needed 200 each 3  . HYDROcodone-acetaminophen (NORCO/VICODIN) 5-325 MG tablet Take 1 tablet by mouth every 8 (eight) hours as needed (for pain).    . Insulin Glargine (LANTUS SOLOSTAR) 100 UNIT/ML Solostar Pen INJECT 60 UNITS SUBCUTANEOUSLY ONCE DAILY (Patient taking differently: Inject 70 Units into the skin daily before breakfast.) 15 mL 0  . Insulin Pen Needle (B-D UF III MINI PEN NEEDLES) 31G X 5 MM MISC Use with Solostar Lantus pen needles 100 each 5  . lubiprostone (AMITIZA) 24 MCG capsule Take 24 mcg by mouth daily with breakfast.    . Melatonin 10 MG CAPS Take 10 mg by mouth at bedtime as needed (sleep).    . metoprolol tartrate (LOPRESSOR) 25 MG tablet Take 25 mg by mouth 2 (two) times daily.    Glory Rosebush DELICA LANCETS 50V MISC Test blood glucose 3-4 times daily as needed. ICD-10 code: E11.40. 100 each 5  . pantoprazole (PROTONIX) 40 MG tablet Take 1 tablet (40 mg total) by mouth daily. (Patient taking differently: Take 40 mg by mouth daily as needed (for heartburn).) 30 tablet 1  . quinapril (ACCUPRIL) 20 MG tablet Take 20 mg by mouth daily.    . quinapril-hydrochlorothiazide (ACCURETIC) 20-25 MG tablet TAKE 1 TABLET BY MOUTH ONCE DAILY 90 tablet  3  . amLODipine (NORVASC) 10 MG tablet Take 10 mg by mouth daily.    Marland Kitchen amLODipine (NORVASC) 5 MG tablet Take 1 tablet (5 mg total) by mouth daily. (Patient not taking: Reported on 12/16/2020) 90 tablet 3   No facility-administered medications prior to visit.    PAST MEDICAL HISTORY: Past Medical History:  Diagnosis Date  . Arthritis   . Blood transfusion without reported diagnosis    40 plus yrs ago  . Cataract   . CKD (chronic kidney disease), stage III ([place]) 08/10/2014  . Diabetes mellitus   . GERD (gastroesophageal reflux disease)   . Glaucoma   . Hyperlipidemia   . Hypertension     PAST SURGICAL HISTORY: Past Surgical History:  Procedure Laterality Date  . ABDOMINAL HYSTERECTOMY    . COLONOSCOPY    . POLYPECTOMY      FAMILY HISTORY: Family History  Problem Relation Age of Onset  . Diabetes Daughter   . Hyperlipidemia Mother   . Diabetes Mother   . Cancer Brother   . Hyperlipidemia Brother   . Colon cancer Neg Hx   . Colon polyps Neg Hx   . Rectal cancer Neg Hx   . Stomach cancer Neg Hx   . Esophageal cancer Neg Hx   . Breast cancer Neg Hx     SOCIAL HISTORY: Social History   Socioeconomic History  . Marital status: Divorced    Spouse name: Not on file  . Number of children: 4  . Years of education: 10  . Highest education level: Not on file  Occupational History  . Occupation: Retired  Tobacco Use  . Smoking status: Former Smoker    Years: 30.00    Types: Cigarettes    Quit date: 10/09/2014    Years since quitting: 6.2  . Smokeless tobacco: Never Used  . Tobacco comment: 1 cig a week  Vaping Use  . Vaping Use: Never used  Substance and Sexual Activity  . Alcohol use: Yes    Alcohol/week: 1.0 standard drink    Types: 1 Cans of beer per week    Comment: occasional beer  . Drug use: No  . Sexual activity: Not  on file  Other Topics Concern  . Not on file  Social History Narrative   Health Care POA:    Emergency Contact: Junie Avilla  (daughter) [phone]   End of Life Plan: Pt will bring copy of living will to next visit   Who lives with you: By herself   Any pets: 0   Diet: Pt reports recently reducing her intake of starches and carbohydrates. Pt reports eating more fresh fruits and vegetables to help reduce her A1C.     Exercise: Pt reports using the treadmill for 60 minutes, 2-3 times per week.    Seatbelts: Pt reports wearing a seatbelt when in vehicle.   Nancy Fetter Exposure/Protection: Pt denies wearing any protection but mentioned she will start due to side effects of her recently prescribed medication.    Hobbies: Reading and going to movies with family.   Social Determinants of Health   Financial Resource Strain: Not on file  Food Insecurity: Not on file  Transportation Needs: Not on file  Physical Activity: Not on file  Stress: Not on file  Social Connections: Not on file  Intimate Partner Violence: Not on file      PHYSICAL EXAM  Vitals:   01/10/21 1340  BP: (!) 143/81  Pulse: 76  Weight: 186 lb (84.4 kg)  Height: '5\' 2"'  (1.575 m)   Body mass index is 34.02 kg/m.  Generalized: Well developed, in no acute distress  Cardiology: normal rate and rhythm, no murmur noted Respiratory: clear to auscultation bilaterally  Neurological examination  Mentation: Alert oriented to time, place, history taking. Follows all commands speech and language fluent Cranial nerve II-XII: Pupils were equal round reactive to light. Extraocular movements were full, visual field were full on confrontational test.  Motor: The motor testing reveals 5 over 5 strength of all 4 extremities. Good symmetric motor tone is noted throughout.  Gait and station: Gait is normal.   DIAGNOSTIC DATA (LABS, IMAGING, TESTING) - I reviewed patient records, labs, notes, testing and imaging myself where available.  MMSE - Mini Mental State Exam 03/09/2019 03/08/2014  Orientation to time 5 4  Orientation to Place 4 4  Registration 3 3  Attention/  Calculation 4 5  Recall 3 3  Language- name 2 objects 2 2  Language- repeat 1 1  Language- follow 3 step command 3 3  Language- read & follow direction 1 1  Write a sentence 1 1  Copy design 1 0  Total score 28 27     Lab Results  Component Value Date   WBC 6.7 12/16/2020   HGB 11.9 (L) 12/16/2020   HCT 35.0 (L) 12/16/2020   MCV 91.3 12/16/2020   PLT 262 12/16/2020      Component Value Date/Time   NA 141 12/16/2020 1107   NA 143 03/31/2018 1121   K 4.7 12/16/2020 1107   CL 106 12/16/2020 1107   CO2 25 12/16/2020 1023   GLUCOSE 93 12/16/2020 1107   BUN 45 (H) 12/16/2020 1107   BUN 23 03/31/2018 1121   CREATININE 2.30 (H) 12/16/2020 1107   CREATININE 1.02 (H) 06/06/2015 1452   CALCIUM 8.7 (L) 12/16/2020 1023   PROT 5.9 (L) 12/16/2020 1023   ALBUMIN 2.8 (L) 12/16/2020 1023   AST 25 12/16/2020 1023   ALT 21 12/16/2020 1023   ALKPHOS 82 12/16/2020 1023   BILITOT 0.3 12/16/2020 1023   GFRNONAA 22 (L) 12/16/2020 1023   GFRNONAA 42 (L) 06/12/2014 1150   GFRAA 47 (L) 03/31/2018  [place] (L) 06/12/2014 1150   Lab Results  Component Value Date   CHOL 183 11/21/2015   HDL 46 11/21/2015   LDLCALC 64 11/21/2015   LDLDIRECT 109 (H) 06/12/2014   TRIG 365 (H) 11/21/2015   CHOLHDL 4.0 11/21/2015   Lab Results  Component Value Date   HGBA1C 10.8 (A) 03/31/2018   No results found for: [id number] Lab Results  Component Value Date   TSH 1.972 11/20/2015    ASSESSMENT AND PLAN 75 y.o. year old female  has a past medical history of Arthritis, Blood transfusion without reported diagnosis, Cataract, CKD (chronic kidney disease), stage III ([place]) (08/10/2014), Diabetes mellitus, GERD (gastroesophageal reflux disease), Glaucoma, Hyperlipidemia, and Hypertension. here with     ICD-10-CM   1. OSA on CPAP  G47.33    Z99.89   2. Memory loss  R41.3   3. Severe obstructive sleep apnea  G47.33 For home use only DME continuous positive airway pressure (CPAP)     Kaitland wishes  to resume CPAP therapy for severe OSA with hypoemia. She has been very concerned about her health and understands the adverse effects untreated sleep apnea will pose. I will resubmit orders for CPAP initiation. I am hopeful that her insurance will cover cost of CPAP machine. She will consider paying cash if they do not. We have had a lengthy conversation regarding her memory and concerns of depression. Remote and recent memory are intact. No signs of neurodegenerative memory loss today, however, may consider formal neurocognitive eval as indicated following initiation of CPAP therapy and management of apnea. She was encouraged to continue using CPAP nightly and for greater than 4 hours each night.  She will follow-up in 3 months, sooner if needed.  She verbalizes understanding and agreement with this plan.   Orders Placed This Encounter  Procedures  . For home use only DME continuous positive airway pressure (CPAP)    CPAP initiation:  Please start Suezanne Cheshire on nasal CPAP (with all necessary PAP related supplies, such as hose, filters, connectors) at a pressure of + 14 cm of water pressure.  Mask: Medium-sized ResMed Airfit N20 nasal mask with heated humidification and addition of a chinstrap if needed and EPR setting of 3 or as desired for comfort. Compliance data required (to include residual AHI, total days of treatment, days used/total days, average usage in hours and minutes, % used days > 4 hours, leak information in l/min, pressure information and EPR setting) - 30 days post initial set-up, then every 3 months twice, then every 6 months, thereafter and ongoing.  Please fax compliance data to: [phone], attention Dr. Star Age.    Order Specific Question:   Length of Need    Answer:   Lifetime    Order Specific Question:   Patient has OSA or probable OSA    Answer:   Yes    Order Specific Question:   Is the patient currently using CPAP in the home    Answer:   Yes    Order  Specific Question:   Settings    Answer:   Other see comments    Order Specific Question:   CPAP supplies needed    Answer:   Mask, headgear, cushions, filters, heated tubing and water chamber     No orders of the defined types were placed in this encounter.     I spent 30 minutes with the patient. 50% of this time was spent counseling and educating patient on  plan of care and medications.    Debbora Presto, FNP-C 01/10/2021, 4:28 PM [facility] [address] [place], [place] [address] [phone]

## 2021-01-10 ENCOUNTER — Other Ambulatory Visit: Payer: Self-pay

## 2021-01-10 ENCOUNTER — Ambulatory Visit (INDEPENDENT_AMBULATORY_CARE_PROVIDER_SITE_OTHER): Payer: Medicare Other | Admitting: Family Medicine

## 2021-01-10 ENCOUNTER — Encounter: Payer: Self-pay | Admitting: Family Medicine

## 2021-01-10 VITALS — BP 143/81 | HR 76 | Ht 62.0 in | Wt 186.0 lb

## 2021-01-10 DIAGNOSIS — Z9989 Dependence on other enabling machines and devices: Secondary | ICD-10-CM

## 2021-01-10 DIAGNOSIS — R413 Other amnesia: Secondary | ICD-10-CM

## 2021-01-10 DIAGNOSIS — G4733 Obstructive sleep apnea (adult) (pediatric): Secondary | ICD-10-CM

## 2021-01-10 NOTE — Patient Instructions (Addendum)
We will reorder CPAP therapy. Remember that we are going to start with small goals. When you hear back from the DME company to get set up with CPAP let me know and we will schedule follow up.  While your insurance requires that you use CPAP at least 4 hours each night on 70% of the nights, I recommend, that you not skip any nights and use it throughout the night if you can. Getting used to CPAP and staying with the treatment long term does take time and patience and discipline. Untreated obstructive sleep apnea when it is moderate to severe can have an adverse impact on cardiovascular health and raise her risk for heart disease, arrhythmias, hypertension, congestive heart failure, stroke and diabetes. Untreated obstructive sleep apnea causes sleep disruption, nonrestorative sleep, and sleep deprivation. This can have an impact on your day to day functioning and cause daytime sleepiness and impairment of cognitive function, memory loss, mood disturbance, and problems focussing. Using CPAP regularly can improve these symptoms.   Management of Memory Problems   There are some general things you can do to help manage your memory problems.  Your memory may not in fact recover, but by using techniques and strategies you will be able to manage your memory difficulties better.   1)  Establish a routine. ? Try to establish and then stick to a regular routine.  By doing this, you will get used to what to expect and you will reduce the need to rely on your memory.  Also, try to do things at the same time of day, such as taking your medication or checking your calendar first thing in the morning. ? Think about think that you can do as a part of a regular routine and make a list.  Then enter them into a daily planner to remind you.  This will help you establish a routine.   2)  Organize your environment. ? Organize your environment so that it is uncluttered.  Decrease visual stimulation.  Place everyday items such  as keys or cell phone in the same place every day (ie.  Basket next to front door) ? Use post it notes with a brief message to yourself (ie. Turn off light, lock the door) ? Use labels to indicate where things go (ie. Which cupboards are for food, dishes, etc.) ? Keep a notepad and pen by the telephone to take messages   3)  Memory Aids ? A diary or journal/notebook/daily planner ? Making a list (shopping list, chore list, to do list that needs to be done) ? Using an alarm as a reminder (kitchen timer or cell phone alarm) ? Using cell phone to store information (Notes, Calendar, Reminders) ? Calendar/White board placed in a prominent position ? Post-it notes   In order for memory aids to be useful, you need to have good habits.  It's no good remembering to make a note in your journal if you don't remember to look in it.  Try setting aside a certain time of day to look in journal.   4)  Improving mood and managing fatigue. 1. There may be other factors that contribute to memory difficulties.  Factors, such as anxiety, depression and tiredness can affect memory.  Regular gentle exercise can help improve your mood and give you more energy.  Simple relaxation techniques may help relieve symptoms of anxiety  Try to get back to completing activities or hobbies you enjoyed doing in the past.  Learn to pace yourself  through activities to decrease fatigue.  Find out about some local support groups where you can share experiences with others.  Try and achieve 7-8 hours of sleep at night.   Sleep Apnea Sleep apnea affects breathing during sleep. It causes breathing to stop for a short time or to become shallow. It can also increase the risk of:  Heart attack.  Stroke.  Being very overweight (obese).  Diabetes.  Heart failure.  Irregular heartbeat. The goal of treatment is to help you breathe normally again. What are the causes? There are three kinds of sleep apnea:  Obstructive  sleep apnea. This is caused by a blocked or collapsed airway.  Central sleep apnea. This happens when the brain does not send the right signals to the muscles that control breathing.  Mixed sleep apnea. This is a combination of obstructive and central sleep apnea. The most common cause of this condition is a collapsed or blocked airway. This can happen if:  Your throat muscles are too relaxed.  Your tongue and tonsils are too large.  You are overweight.  Your airway is too small.   What increases the risk?  Being overweight.  Smoking.  Having a small airway.  Being older.  Being female.  Drinking alcohol.  Taking medicines to calm yourself (sedatives or tranquilizers).  Having family members with the condition. What are the signs or symptoms?  Trouble staying asleep.  Being sleepy or tired during the day.  Getting angry a lot.  Loud snoring.  Headaches in the morning.  Not being able to focus your mind (concentrate).  Forgetting things.  Less interest in sex.  Mood swings.  Personality changes.  Feelings of sadness (depression).  Waking up a lot during the night to pee (urinate).  Dry mouth.  Sore throat. How is this diagnosed?  Your medical history.  A physical exam.  A test that is done when you are sleeping (sleep study). The test is most often done in a sleep lab but may also be done at home. How is this treated?  Sleeping on your side.  Using a medicine to get rid of mucus in your nose (decongestant).  Avoiding the use of alcohol, medicines to help you relax, or certain pain medicines (narcotics).  Losing weight, if needed.  Changing your diet.  Not smoking.  Using a machine to open your airway while you sleep, such as: ? An oral appliance. This is a mouthpiece that shifts your lower jaw forward. ? A CPAP device. This device blows air through a mask when you breathe out (exhale). ? An EPAP device. This has valves that you put in  each nostril. ? A BPAP device. This device blows air through a mask when you breathe in (inhale) and breathe out.  Having surgery if other treatments do not work. It is important to get treatment for sleep apnea. Without treatment, it can lead to:  High blood pressure.  Coronary artery disease.  In men, not being able to have an erection (impotence).  Reduced thinking ability.   Follow these instructions at home: Lifestyle  Make changes that your doctor recommends.  Eat a healthy diet.  Lose weight if needed.  Avoid alcohol, medicines to help you relax, and some pain medicines.  Do not use any products that contain nicotine or tobacco, such as cigarettes, e-cigarettes, and chewing tobacco. If you need help quitting, ask your doctor. General instructions  Take over-the-counter and prescription medicines only as told by your doctor.  If you were given a machine to use while you sleep, use it only as told by your doctor.  If you are having surgery, make sure to tell your doctor you have sleep apnea. You may need to bring your device with you.  Keep all follow-up visits as told by your doctor. This is important. Contact a doctor if:  The machine that you were given to use during sleep bothers you or does not seem to be working.  You do not get better.  You get worse. Get help right away if:  Your chest hurts.  You have trouble breathing in enough air.  You have an uncomfortable feeling in your back, arms, or stomach.  You have trouble talking.  One side of your body feels weak.  A part of your face is hanging down. These symptoms may be an emergency. Do not wait to see if the symptoms will go away. Get medical help right away. Call your local emergency services (911 in the [place] Do not drive yourself to the hospital. Summary  This condition affects breathing during sleep.  The most common cause is a collapsed or blocked airway.  The goal of treatment is to  help you breathe normally while you sleep. This information is not intended to replace advice given to you by your health care provider. Make sure you discuss any questions you have with your health care provider. Document Revised: 06/25/2018 Document Reviewed: 05/04/2018 Elsevier Patient Education  [employer].

## 2021-01-14 NOTE — Progress Notes (Signed)
Cm sent to [facility]

## 2021-01-15 ENCOUNTER — Other Ambulatory Visit: Payer: Self-pay | Admitting: Family

## 2021-01-15 DIAGNOSIS — Z1231 Encounter for screening mammogram for malignant neoplasm of breast: Secondary | ICD-10-CM

## 2021-01-21 ENCOUNTER — Emergency Department (HOSPITAL_COMMUNITY)
Admission: EM | Admit: 2021-01-21 | Discharge: 2021-01-22 | Disposition: A | Discharge status: Home / Self Care | Payer: Medicare Other | Attending: Emergency Medicine | Admitting: Emergency Medicine

## 2021-01-21 ENCOUNTER — Emergency Department (HOSPITAL_COMMUNITY): Payer: Medicare Other

## 2021-01-21 ENCOUNTER — Encounter (HOSPITAL_COMMUNITY): Payer: Self-pay

## 2021-01-21 DIAGNOSIS — Z87891 Personal history of nicotine dependence: Secondary | ICD-10-CM | POA: Insufficient documentation

## 2021-01-21 DIAGNOSIS — Z79899 Other long term (current) drug therapy: Secondary | ICD-10-CM | POA: Diagnosis not present

## 2021-01-21 DIAGNOSIS — Z23 Encounter for immunization: Secondary | ICD-10-CM | POA: Insufficient documentation

## 2021-01-21 DIAGNOSIS — Z7982 Long term (current) use of aspirin: Secondary | ICD-10-CM | POA: Insufficient documentation

## 2021-01-21 DIAGNOSIS — N183 Chronic kidney disease, stage 3 unspecified: Secondary | ICD-10-CM | POA: Diagnosis not present

## 2021-01-21 DIAGNOSIS — I129 Hypertensive chronic kidney disease with stage 1 through stage 4 chronic kidney disease, or unspecified chronic kidney disease: Secondary | ICD-10-CM | POA: Insufficient documentation

## 2021-01-21 DIAGNOSIS — S91209A Unspecified open wound of unspecified toe(s) with damage to nail, initial encounter: Secondary | ICD-10-CM

## 2021-01-21 DIAGNOSIS — E1140 Type 2 diabetes mellitus with diabetic neuropathy, unspecified: Secondary | ICD-10-CM | POA: Insufficient documentation

## 2021-01-21 DIAGNOSIS — Z7984 Long term (current) use of oral hypoglycemic drugs: Secondary | ICD-10-CM | POA: Insufficient documentation

## 2021-01-21 DIAGNOSIS — S91202A Unspecified open wound of left great toe with damage to nail, initial encounter: Secondary | ICD-10-CM | POA: Insufficient documentation

## 2021-01-21 DIAGNOSIS — W450XXA Nail entering through skin, initial encounter: Secondary | ICD-10-CM | POA: Insufficient documentation

## 2021-01-21 DIAGNOSIS — Y9301 Activity, walking, marching and hiking: Secondary | ICD-10-CM | POA: Insufficient documentation

## 2021-01-21 DIAGNOSIS — E1149 Type 2 diabetes mellitus with other diabetic neurological complication: Secondary | ICD-10-CM | POA: Diagnosis not present

## 2021-01-21 DIAGNOSIS — Z794 Long term (current) use of insulin: Secondary | ICD-10-CM | POA: Diagnosis not present

## 2021-01-21 DIAGNOSIS — S99922A Unspecified injury of left foot, initial encounter: Secondary | ICD-10-CM | POA: Diagnosis present

## 2021-01-21 MED ORDER — TETANUS-DIPHTH-ACELL PERTUSSIS 5-2.5-18.5 LF-MCG/0.5 IM SUSY
0.5000 mL | PREFILLED_SYRINGE | Freq: Once | INTRAMUSCULAR | Status: AC
  Administered 2021-01-21: 0.5 mL via INTRAMUSCULAR
  Filled 2021-01-21: qty 0.5

## 2021-01-21 MED ORDER — OXYCODONE-ACETAMINOPHEN 5-325 MG PO TABS
1.0000 | ORAL_TABLET | Freq: Once | ORAL | Status: AC
  Administered 2021-01-21: 1 via ORAL
  Filled 2021-01-21: qty 1

## 2021-01-21 MED ORDER — LIDOCAINE HCL 2 % IJ SOLN
10.0000 mL | Freq: Once | INTRAMUSCULAR | Status: AC
  Administered 2021-01-22: 200 mg
  Filled 2021-01-21: qty 20

## 2021-01-21 MED ORDER — LIDOCAINE HCL 2 % IJ SOLN
10.0000 mL | Freq: Once | INTRAMUSCULAR | Status: AC
  Administered 2021-01-21: 200 mg via INTRADERMAL
  Filled 2021-01-21: qty 20

## 2021-01-21 NOTE — ED Triage Notes (Signed)
Pt was walking up the stairs with open toe shoes on and hit her left big toe on the step and the toenail is ripped up

## 2021-01-21 NOTE — ED Provider Notes (Signed)
MSE was initiated and I personally evaluated the patient and placed orders (if any) at  10:21 PM on Jan 21, 2021.  The patient appears stable so that the remainder of the MSE may be completed by another provider.  Patient was walking up the steps, and her L great toe nail got caught against a hard surface and pulled away from the nail bed.  Unsure last TDAP.  Sharp pain to L great toe, no numbness.  Hx of DM.   Left foot: Left great toenail partially avulsed from nail bed tender to palpation    Domenic Moras, PA-C 01/21/21 2223    Davonna Belling, MD 01/22/21 0025

## 2021-01-21 NOTE — ED Provider Notes (Signed)
[facility] DEPT Provider Note   CSN: [id number] Arrival date & time: 01/21/21  1912     History No chief complaint on file.   Anna [name] is a 75 y.o. female.  The history is provided by the patient and medical records.   Anna [name] is a 75 y.o. female who presents to the Emergency Department complaining of nail injury.  Around 7pm she was walking up the steps when her left great toe nail got caught and was torn.  She has isolated pain to the nailbed, no significant foot pain.  She has a hx/o DM.  Sxs are moderate and constant in nature.      Past Medical History:  Diagnosis Date  . Arthritis   . Blood transfusion without reported diagnosis    40 plus yrs ago  . Cataract   . CKD (chronic kidney disease), stage III ([place]) 08/10/2014  . Diabetes mellitus   . GERD (gastroesophageal reflux disease)   . Glaucoma   . Hyperlipidemia   . Hypertension     Patient Active Problem List   Diagnosis Date Noted  . Plantar warts 06/17/2016  . Benign essential HTN   . Cranial nerve VI palsy   . 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 Diplopia 11/20/2015  . Abdominal pain, epigastric 03/02/2015  . TIA (transient ischemic attack) 10/25/2014  . CKD (chronic kidney disease), stage III ([place]) 08/10/2014  . HLD (hyperlipidemia) 06/12/2014  . Right shoulder pain 06/12/2014  . Depression 12/03/2012  . History of tobacco use 06/09/2009  . VAGINITIS, ATROPHIC 10/25/2008  . COLONIC POLYPS 01/21/2007  . Diabetes mellitus with diabetic neuropathy, with long-term current use of insulin ([place]) 11/19/2006  . OBESITY, NOS 11/19/2006  . GLAUCOMA 11/19/2006  . CATARACT 11/19/2006  . HYPERTENSION, BENIGN SYSTEMIC 11/19/2006  . DIVERTICULOSIS OF COLON 11/19/2006  . PROTEINURIA 11/19/2006    Past Surgical History:  Procedure Laterality Date  . ABDOMINAL HYSTERECTOMY    . COLONOSCOPY    . POLYPECTOMY       OB History    No obstetric history on file.     Family History  Problem Relation Age of Onset  . Diabetes Daughter   . Hyperlipidemia Mother   . Diabetes Mother   . Cancer Brother   . Hyperlipidemia Brother   . Colon cancer Neg Hx   . Colon polyps Neg Hx   . Rectal cancer Neg Hx   . Stomach cancer Neg Hx   . Esophageal cancer Neg Hx   . Breast cancer Neg Hx     Social History   Tobacco Use  . Smoking status: Former Smoker    Years: 30.00    Types: Cigarettes    Quit date: 10/09/2014    Years since quitting: 6.2  . Smokeless tobacco: Never Used  . Tobacco comment: 1 cig a week  Vaping Use  . Vaping Use: Never used  Substance Use Topics  . Alcohol use: Yes    Alcohol/week: 1.0 standard drink    Types: 1 Cans of beer per week    Comment: occasional beer  . Drug use: No    Home Medications Prior to Admission medications   Medication Sig Start Date End Date Taking? Authorizing Provider  cephALEXin (KEFLEX) 500 MG capsule Take 1 capsule (500 mg total) by mouth 2 (two) times daily. 01/22/21  Yes Quintella Reichert, MD  acetaminophen (TYLENOL) 500 MG tablet Take 500-1,000 mg by mouth  every 6 (six) hours as needed for moderate pain or mild pain.    [provider]  aspirin 81 MG tablet Take 1 tablet (81 mg total) by mouth daily. 02/24/17   Diallo, Earna Coder, MD  atorvastatin (LIPITOR) 40 MG tablet Take 1 tablet (40 mg total) by mouth daily. 09/30/17   Diallo, Earna Coder, MD  Blood Glucose Monitoring Suppl (ONE TOUCH ULTRA 2) w/Device KIT 1 Device by Does not apply route once. 11/29/15   Olam Idler, MD  canagliflozin (INVOKANA) 300 MG TABS tablet Take 1 tablet (300 mg total) by mouth daily before breakfast. 03/04/17   Diallo, Earna Coder, MD  Cholecalciferol (VITAMIN D-3) 25 MCG (1000 UT) CAPS Take 1,000 Units by mouth daily.    [provider]  dorzolamide-timolol (COSOPT) 22.3-6.8 MG/ML ophthalmic solution Place 1 drop into both eyes 2 (two) times daily. 04/11/20   [provider]  escitalopram (LEXAPRO) 10 MG tablet Take 10 mg by mouth daily.    [provider]  gabapentin (NEURONTIN) 100 MG capsule Take 100 mg by mouth 3 (three) times daily as needed (for neuropathy).    [provider]  glucose blood (ONE TOUCH ULTRA TEST) test strip Check sugar 3-4 x daily as needed 02/24/17   Diallo, Earna Coder, MD  HYDROcodone-acetaminophen (NORCO/VICODIN) 5-325 MG tablet Take 1 tablet by mouth every 8 (eight) hours as needed (for pain). 11/23/20   [provider]  Insulin Glargine (LANTUS SOLOSTAR) 100 UNIT/ML Solostar Pen INJECT 60 UNITS SUBCUTANEOUSLY ONCE DAILY Patient taking differently: Inject 70 Units into the skin daily before breakfast. 02/10/19   Diallo, Abdoulaye, MD  Insulin Pen Needle (B-D UF III MINI PEN NEEDLES) 31G X 5 MM MISC Use with Solostar Lantus pen needles 02/24/17   Diallo, Abdoulaye, MD  lubiprostone (AMITIZA) 24 MCG capsule Take 24 mcg by mouth daily with breakfast.    [provider]  Melatonin 10 MG CAPS Take 10 mg by mouth at bedtime as needed (sleep).    [provider]  metoprolol tartrate (LOPRESSOR) 25 MG tablet Take 25 mg by mouth 2 (two) times daily.    [provider]  [facility] DELICA LANCETS 16X MISC Test blood glucose 3-4 times daily as needed. ICD-10 code: E11.40. 02/25/17   Marjie Skiff, MD  pantoprazole (PROTONIX) 40 MG tablet Take 1 tablet (40 mg total) by mouth daily. Patient taking differently: Take 40 mg by mouth daily as needed (for heartburn). 01/25/18   Diallo, Earna Coder, MD  quinapril (ACCUPRIL) 20 MG tablet Take 20 mg by mouth daily.    [provider]  quinapril-hydrochlorothiazide (ACCURETIC) 20-25 MG tablet TAKE 1 TABLET BY MOUTH ONCE DAILY 04/12/18   Marjie Skiff, MD    Allergies    Metformin  Review of Systems   Review of Systems  All other systems reviewed and are negative.   Physical Exam Updated Vital Signs BP (!) 183/92 (BP Location: Left Arm)    Pulse 60   Temp 97.7 F (36.5 C) (Oral)   Resp 16   SpO2 95%   Physical Exam Vitals and nursing note reviewed.  Constitutional:      Appearance: She is well-developed.  HENT:     Head: Normocephalic and atraumatic.  Cardiovascular:     Rate and Rhythm: Normal rate and regular rhythm.  Pulmonary:     Effort: Pulmonary effort is normal.     Breath sounds: Normal breath sounds.  Abdominal:     Tenderness: There is no rebound.  Musculoskeletal:  Comments: 2+ DP pulses bilaterally.  There is a partial avulsion to the left great toe nail on the medial aspect with local tenderness to palpation.  No significant tenderness to the foot.    Skin:    General: Skin is warm and dry.  Neurological:     Mental Status: She is alert and oriented to person, place, and time.  Psychiatric:        Behavior: Behavior normal.     ED Results / Procedures / Treatments   Labs (all labs ordered are listed, but only abnormal results are displayed) Labs Reviewed - No data to display  EKG None  Radiology DG Toe Great Left  Result Date: 01/22/2021 CLINICAL DATA:  Status post trauma. EXAM: LEFT GREAT TOE COMPARISON:  None. FINDINGS: There is no evidence of fracture or dislocation. There is no evidence of arthropathy or other focal bone abnormality. Soft tissues are unremarkable. IMPRESSION: Negative. Electronically Signed   By: Virgina Norfolk M.D.   On: 01/22/2021 00:06    Procedures .Nail Removal  Date/Time: 01/22/2021 1:47 AM Performed by: Quintella Reichert, MD Authorized by: Quintella Reichert, MD   Consent:    Consent obtained:  Verbal   Consent given by:  Patient   Risks discussed:  Bleeding, incomplete removal, infection and pain Universal protocol:    Patient identity confirmed:  Verbally with patient Location:    Foot:  L big toe Pre-procedure details:    Skin preparation:  Povidone-iodine Anesthesia:    Anesthesia method:  Local infiltration and nerve block   Local anesthetic:   Lidocaine 2% w/o epi   Block location:  Great toe   Block needle gauge:  25 G   Block technique:  Digital   Block injection procedure:  Anatomic landmarks identified and introduced needle   Block outcome:  Incomplete block Nail Removal:    Nail removed:  Complete   Nail bed repaired: no     Stented with:  Splint applied with xeroform and aluminum, sutured in place with 4, 4-0 prolene sutures Post-procedure details:    Dressing:  Gauze roll   Procedure completion:  Tolerated     Medications Ordered in ED Medications  ondansetron (ZOFRAN-ODT) disintegrating tablet 4 mg (0 mg Oral Hold 01/22/21 0054)  Tdap (BOOSTRIX) injection 0.5 mL (0.5 mLs Intramuscular Given 01/21/21 2240)  oxyCODONE-acetaminophen (PERCOCET/ROXICET) 5-325 MG per tablet 1 tablet (1 tablet Oral Given 01/21/21 2240)  lidocaine (XYLOCAINE) 2 % (with pres) injection 200 mg (200 mg Intradermal Given 01/21/21 2240)  lidocaine (XYLOCAINE) 2 % (with pres) injection 200 mg (200 mg Infiltration Given 01/22/21 0054)    ED Course  I have reviewed the triage vital signs and the nursing notes.  Pertinent labs & imaging results that were available during my care of the patient were reviewed by me and considered in my medical decision making (see chart for details).    MDM Rules/Calculators/A&P                         patient with history of diabetes here for evaluation after partial avulsion of her great toe. Nail was removed. There is a fracture to the nail that was removed and it was unable to be replaced. A splint was made out of aluminum and zero form gauze and sutured in place. Given history of diabetes will start prophylactic antibiotics. Referred to podiatry for follow-up. Return precautions discussed.   Final Clinical Impression(s) / ED Diagnoses Final diagnoses:  Avulsion  of toenail, initial encounter    Rx / DC Orders ED Discharge Orders         Ordered    cephALEXin (KEFLEX) 500 MG capsule  2 times daily         01/22/21 0132           Quintella Reichert, MD 01/22/21 [phone]

## 2021-01-22 DIAGNOSIS — S91202A Unspecified open wound of left great toe with damage to nail, initial encounter: Secondary | ICD-10-CM | POA: Diagnosis not present

## 2021-01-22 MED ORDER — ONDANSETRON 4 MG PO TBDP
4.0000 mg | ORAL_TABLET | Freq: Once | ORAL | Status: DC
  Filled 2021-01-22: qty 1

## 2021-01-22 MED ORDER — CEPHALEXIN 500 MG PO CAPS: 500.0000 mg | ORAL_CAPSULE | Freq: Two times a day (BID) | ORAL | 0 refills | Status: DC

## 2021-01-23 ENCOUNTER — Other Ambulatory Visit: Payer: Self-pay

## 2021-01-23 ENCOUNTER — Ambulatory Visit (INDEPENDENT_AMBULATORY_CARE_PROVIDER_SITE_OTHER): Payer: Medicare Other | Admitting: Podiatry

## 2021-01-23 DIAGNOSIS — S90212A Contusion of left great toe with damage to nail, initial encounter: Secondary | ICD-10-CM

## 2021-01-25 ENCOUNTER — Encounter: Payer: Self-pay | Admitting: Podiatry

## 2021-01-25 NOTE — Progress Notes (Signed)
Subjective:  Patient ID: Anna [name], female    DOB: [birth year],  MRN: [id number]  Chief Complaint  Patient presents with  . Nail Problem    PT stated that she went to the er because she hit her toe and they removed the nail     75 y.o. female presents with the above complaint.  Patient presents with complaint of left and nail contusion/hallux contusion.  Patient states that her foot got caught because of trauma and the nail came right off.  She went to the emergency room where they remove the nail.  She still has a pretty good amount of pain present.  The stitch and aluminum foil onto the nail for healing.  She denies any other acute complaints.  She would like to discuss the next treatment options.   Review of Systems: Negative except as noted in the HPI. Denies N/V/F/Ch.  Past Medical History:  Diagnosis Date  . Arthritis   . Blood transfusion without reported diagnosis    40 plus yrs ago  . Cataract   . CKD (chronic kidney disease), stage III ([place]) 08/10/2014  . Diabetes mellitus   . GERD (gastroesophageal reflux disease)   . Glaucoma   . Hyperlipidemia   . Hypertension     Current Outpatient Medications:  .  acetaminophen (TYLENOL) 500 MG tablet, Take 500-1,000 mg by mouth every 6 (six) hours as needed for moderate pain or mild pain., Disp: , Rfl:  .  aspirin 81 MG tablet, Take 1 tablet (81 mg total) by mouth daily., Disp: 30 tablet, Rfl: 11 .  atorvastatin (LIPITOR) 40 MG tablet, Take 1 tablet (40 mg total) by mouth daily., Disp: 90 tablet, Rfl: 0 .  Blood Glucose Monitoring Suppl (ONE TOUCH ULTRA 2) w/Device KIT, 1 Device by Does not apply route once., Disp: 1 each, Rfl: 0 .  canagliflozin (INVOKANA) 300 MG TABS tablet, Take 1 tablet (300 mg total) by mouth daily before breakfast., Disp: 90 tablet, Rfl: 1 .  cephALEXin (KEFLEX) 500 MG capsule, Take 1 capsule (500 mg total) by mouth 2 (two) times daily., Disp: 20 capsule, Rfl: 0 .  Cholecalciferol (VITAMIN D-3) 25  MCG (1000 UT) CAPS, Take 1,000 Units by mouth daily., Disp: , Rfl:  .  dorzolamide-timolol (COSOPT) 22.3-6.8 MG/ML ophthalmic solution, Place 1 drop into both eyes 2 (two) times daily., Disp: , Rfl:  .  escitalopram (LEXAPRO) 10 MG tablet, Take 10 mg by mouth daily., Disp: , Rfl:  .  gabapentin (NEURONTIN) 100 MG capsule, Take 100 mg by mouth 3 (three) times daily as needed (for neuropathy)., Disp: , Rfl:  .  glucose blood (ONE TOUCH ULTRA TEST) test strip, Check sugar 3-4 x daily as needed, Disp: 200 each, Rfl: 3 .  HYDROcodone-acetaminophen (NORCO/VICODIN) 5-325 MG tablet, Take 1 tablet by mouth every 8 (eight) hours as needed (for pain)., Disp: , Rfl:  .  Insulin Glargine (LANTUS SOLOSTAR) 100 UNIT/ML Solostar Pen, INJECT 60 UNITS SUBCUTANEOUSLY ONCE DAILY (Patient taking differently: Inject 70 Units into the skin daily before breakfast.), Disp: 15 mL, Rfl: 0 .  Insulin Pen Needle (B-D UF III MINI PEN NEEDLES) 31G X 5 MM MISC, Use with Solostar Lantus pen needles, Disp: 100 each, Rfl: 5 .  lubiprostone (AMITIZA) 24 MCG capsule, Take 24 mcg by mouth daily with breakfast., Disp: , Rfl:  .  Melatonin 10 MG CAPS, Take 10 mg by mouth at bedtime as needed (sleep)., Disp: , Rfl:  .  metoprolol tartrate (LOPRESSOR) 25  MG tablet, Take 25 mg by mouth 2 (two) times daily., Disp: , Rfl:  .  ONETOUCH DELICA LANCETS 73A MISC, Test blood glucose 3-4 times daily as needed. ICD-10 code: E11.40., Disp: 100 each, Rfl: 5 .  pantoprazole (PROTONIX) 40 MG tablet, Take 1 tablet (40 mg total) by mouth daily. (Patient taking differently: Take 40 mg by mouth daily as needed (for heartburn).), Disp: 30 tablet, Rfl: 1 .  quinapril (ACCUPRIL) 20 MG tablet, Take 20 mg by mouth daily., Disp: , Rfl:  .  quinapril-hydrochlorothiazide (ACCURETIC) 20-25 MG tablet, TAKE 1 TABLET BY MOUTH ONCE DAILY, Disp: 90 tablet, Rfl: 3  Social History   Tobacco Use  Smoking Status Former Smoker  . Years: 30.00  . Types: Cigarettes  . Quit  date: 10/09/2014  . Years since quitting: 6.3  Smokeless Tobacco Never Used  Tobacco Comment   1 cig a week    Allergies  Allergen Reactions  . Metformin Diarrhea and Other (See Comments)    GI upset    Objective:  There were no vitals filed for this visit. There is no height or weight on file to calculate BMI. Constitutional Well developed. Well nourished.  Vascular Dorsalis pedis pulses palpable bilaterally. Posterior tibial pulses palpable bilaterally. Capillary refill normal to all digits.  No cyanosis or clubbing noted. Pedal hair growth normal.  Neurologic Normal speech. Oriented to person, place, and time. Epicritic sensation to light touch grossly present bilaterally.  Dermatologic  pain on palpation to the left great toe.  The stitches were removed and the aluminum foil was removed.  No nail plate was present at it was previously removed.  No clinical signs of infection noted.  Healthy nail bed  Orthopedic: Normal joint ROM without pain or crepitus bilaterally. No visible deformities. No bony tenderness.   Radiographs: None Assessment:   1. Contusion of left great toe with damage to nail, initial encounter    Plan:  Patient was evaluated and treated and all questions answered.  Left great toe nail contusion with damage to the nail -I explained to the patient the etiology of contusion and various treatment options were extensively discussed.  I discussed with her that a new nail takes about a 1 year to grow and it may likely be different from what it previously was.  I discussed with her that if it continues to be bothersome then we can discuss doing a total nail avulsion with phenol matricectomy at that time.  She states understanding.  For now we will continue to monitor to see how this nail grows back out.  Given that she is a diabetic I will see her back again in 3 weeks to reassess the healing process.  She states understanding.  No follow-ups on file.

## 2021-01-28 ENCOUNTER — Telehealth: Payer: Self-pay | Admitting: Family Medicine

## 2021-01-28 DIAGNOSIS — R519 Headache, unspecified: Secondary | ICD-10-CM

## 2021-01-28 DIAGNOSIS — R351 Nocturia: Secondary | ICD-10-CM

## 2021-01-28 DIAGNOSIS — R413 Other amnesia: Secondary | ICD-10-CM

## 2021-01-28 DIAGNOSIS — G4733 Obstructive sleep apnea (adult) (pediatric): Secondary | ICD-10-CM

## 2021-01-28 DIAGNOSIS — E669 Obesity, unspecified: Secondary | ICD-10-CM

## 2021-01-28 NOTE — Telephone Encounter (Signed)
Pt's daughter is asking for a call re: the status of pt getting a new CPAP

## 2021-01-28 NOTE — Telephone Encounter (Signed)
Cm sent to [facility] with message/ f/u on cpap.

## 2021-01-29 NOTE — Telephone Encounter (Signed)
I called spoke to daughter of pt, angel rankin as well as LMVM for pt. Relating need for inlab sleep study to be repeated because of gap of using cpap. Daughter  stated yes they would like to proceed forth as pt needs this.

## 2021-01-29 NOTE — Addendum Note (Signed)
Addended by: Brandon Melnick on: 01/29/2021 09:51 AM   Modules accepted: Orders

## 2021-01-29 NOTE — Telephone Encounter (Signed)
Thank you, noted.  Sandy, if you could confirm with the patient that she is agreeable to repeating her in lab study, I will order a diagnostic polysomnogram.

## 2021-01-29 NOTE — Addendum Note (Signed)
Addended by: Star Age on: 01/29/2021 07:48 AM   Modules accepted: Orders

## 2021-01-29 NOTE — Telephone Encounter (Signed)
RE: cpap Received: Adaline Sill, Wilmer Floor, RN; Vanessa Ralphs [place],   We rcvd this message from [facility] on 01/14/21, and Margreta Journey responded:   "Hey Candace,   This patient has [facility] and since she failed CPAP, previously, she will have to have an in lab study.  We can not use her studies from 2020 per their guidelines.   I will notate her account but we can not move forward until those are received."   We will not be able to provide a CPAP to this pt until she has had an in-lab study.   Thank you,  Janett Billow      Previous Messages   ----- Message -----  From: Brandon Melnick, RN  Sent: 01/28/2021  5:26 PM EDT  To: Vanessa Ralphs, Charmian Muff  Subject: cpap                       [place],   New cpap order Annia Friendly  Female, 75 y.o., 02/21/1946   MRN:  [id number]  Phone:  [phone] (M)  from 01-10-2021, following up on this. Pts daughter calling.    Thank you Lovey Newcomer RN [facility]

## 2021-02-15 ENCOUNTER — Other Ambulatory Visit: Payer: Self-pay

## 2021-02-15 ENCOUNTER — Ambulatory Visit (INDEPENDENT_AMBULATORY_CARE_PROVIDER_SITE_OTHER): Payer: Medicare Other | Admitting: Podiatry

## 2021-02-15 DIAGNOSIS — S90212A Contusion of left great toe with damage to nail, initial encounter: Secondary | ICD-10-CM

## 2021-02-19 ENCOUNTER — Encounter: Payer: Self-pay | Admitting: Podiatry

## 2021-02-19 NOTE — Progress Notes (Signed)
Subjective: Anna [name] is a 75 y.o.  female returns to office today for follow up evaluation after having left 1st total nail avulsion performed. Patient has been soaking using epsom salt and applying topical antibiotic covered with bandaid daily. Patient denies fevers, chills, nausea, vomiting. Denies any calf pain, chest pain, SOB.   Objective:  Vitals: Reviewed  General: Well developed, nourished, in no acute distress, alert and oriented x3   Dermatology: Skin is warm, dry and supple bilateral. left 1st nail bed appears to be clean, dry, with mild granular tissue and surrounding scab. There is no surrounding erythema, edema, drainage/purulence. The remaining nails appear unremarkable at this time. There are no other lesions or other signs of infection present.  Neurovascular status: Intact. No lower extremity swelling; No pain with calf compression bilateral.  Musculoskeletal: Decreased tenderness to palpation of the left 1st nail bed. Muscular strength within normal limits bilateral.   Assesement and Plan: S/p total nail avulsion, doing well.   -Continue soaking in epsom salts twice a day followed by antibiotic ointment and a band-aid. Can leave uncovered at night. Continue this until completely healed.  -If the area has not healed in 2 weeks, call the office for follow-up appointment, or sooner if any problems arise.  -Monitor for any signs/symptoms of infection. Call the office immediately if any occur or go directly to the emergency room. Call with any questions/concerns.  Boneta Lucks, DPM

## 2021-02-27 ENCOUNTER — Institutional Professional Consult (permissible substitution): Payer: Medicare Other | Admitting: Neurology

## 2021-03-07 ENCOUNTER — Ambulatory Visit
Admission: RE | Admit: 2021-03-07 | Discharge: 2021-03-07 | Disposition: A | Discharge status: Home / Self Care | Payer: Medicare Other | Source: Ambulatory Visit | Attending: Family | Admitting: Family

## 2021-03-07 ENCOUNTER — Other Ambulatory Visit: Payer: Self-pay

## 2021-03-07 DIAGNOSIS — Z1231 Encounter for screening mammogram for malignant neoplasm of breast: Secondary | ICD-10-CM

## 2021-03-14 ENCOUNTER — Other Ambulatory Visit: Payer: Self-pay

## 2021-03-14 ENCOUNTER — Ambulatory Visit (INDEPENDENT_AMBULATORY_CARE_PROVIDER_SITE_OTHER): Payer: Medicare Other | Admitting: Neurology

## 2021-03-14 DIAGNOSIS — G4733 Obstructive sleep apnea (adult) (pediatric): Secondary | ICD-10-CM | POA: Diagnosis not present

## 2021-03-14 DIAGNOSIS — E669 Obesity, unspecified: Secondary | ICD-10-CM

## 2021-03-14 DIAGNOSIS — R519 Headache, unspecified: Secondary | ICD-10-CM

## 2021-03-14 DIAGNOSIS — R413 Other amnesia: Secondary | ICD-10-CM

## 2021-03-14 DIAGNOSIS — G4720 Circadian rhythm sleep disorder, unspecified type: Secondary | ICD-10-CM

## 2021-03-14 DIAGNOSIS — R351 Nocturia: Secondary | ICD-10-CM

## 2021-03-14 DIAGNOSIS — E66811 Obesity, class 1: Secondary | ICD-10-CM

## 2021-03-26 NOTE — Addendum Note (Signed)
Addended by: Star Age on: 03/26/2021 06:10 PM   Modules accepted: Orders

## 2021-03-26 NOTE — Procedures (Signed)
PATIENT'S NAME:  Anna, [name] DOB:      08/20/1946      MR#:    [id number]     DATE OF RECORDING: 03/14/2021 REFERRING M.D.:  Dustin Folks, FNP Study Performed:  Split-Night Titration Study HISTORY:  75 year old woman with a history of diabetes, hypertension, hyperlipidemia, glaucoma, reflux disease, arthritis, chronic kidney disease and obesity, who presents for re-evaluation of her sleep apnea. The patient endorsed the Epworth Sleepiness Scale at 11 points The patient's weight 186 pounds with a height of 62 (inches), resulting in a BMI of 34.1 kg/m2. The patient's neck circumference measured 15.5 inches.  CURRENT MEDICATIONS: Tylenol, Aspirin, Lipitor, Invokana, Vitamin D3, Lexapro, Norco, Lantus Solostar, Amitiza, Melatonin, Lopressor, Protonix, Accupril, Accuretic, Norvasc   PROCEDURE:  This is a multichannel digital polysomnogram utilizing the Somnostar 11.2 system.  Electrodes and sensors were applied and monitored per AASM Specifications.   EEG, EOG, Chin and Limb EMG, were sampled at 200 Hz.  ECG, Snore and Nasal Pressure, Thermal Airflow, Respiratory Effort, CPAP Flow and Pressure, Oximetry was sampled at 50 Hz. Digital video and audio were recorded.      BASELINE STUDY WITHOUT CPAP RESULTS:  Lights Out was at 21:25 and Lights On at 04:56, she qualified for emergency split due to high AHI and O2 nadir of 77%, split start was at 00:06, epoch 330. Total recording time (TRT) was 161, with a total sleep time (TST) of 130 minutes.   The patient's sleep latency was 26.5 minutes.  REM sleep was absent. The sleep efficiency was 80.7 %.    SLEEP ARCHITECTURE: WASO (Wake after sleep onset) was 6.5 minutes, Stage N1 was 6 minutes, Stage N2 was 124 minutes, Stage N3 was 0 minutes and Stage R (REM sleep) was 0 minutes.  The percentages were Stage N1 4.6%, Stage N2 95.4%, which is increased, Stage N3 and Stage R (REM sleep) were absent.  The arousals were noted as: 22 were spontaneous, 0 were associated  with PLMs, 161 were associated with respiratory events.  RESPIRATORY ANALYSIS:  There were a total of 175 respiratory events:  151 obstructive apneas, 0 central apneas and 4 mixed apneas with a total of 155 apneas and an apnea index (AI) of 71.5. There were 20 hypopneas with a hypopnea index of 9.2. The patient also had 0 respiratory event related arousals (RERAs).  Snoring was noted.     The total APNEA/HYPOPNEA INDEX (AHI) was 80.8 /hour and the total RESPIRATORY DISTURBANCE INDEX was 80.8 /hour.  0 events occurred in REM sleep and 44 events in NREM. The REM AHI was 0, /hour versus a non-REM AHI of 80.8 /hour. The patient spent 134.5 minutes sleep time in the supine position 277 minutes in non-supine. The supine AHI was 120.0 /hour versus a non-supine AHI of 77.8 /hour.  OXYGEN SATURATION & C02:  The wake baseline 02 saturation was 96%, with the lowest being 77%. Time spent below 89% saturation equaled 100 minutes.  PERIODIC LIMB MOVEMENTS: The patient had a total of 0 Periodic Limb Movements.  The Periodic Limb Movement (PLM) index was 0 /hour and the PLM Arousal index was 0 /hour.  Audio and video analysis did not show any abnormal or unusual movements, behaviors, phonations or vocalizations. The patient took no bathroom breaks. Mild to moderate snoring was noted. The EKG was in keeping with normal sinus rhythm (NSR)  TITRATION STUDY WITH CPAP RESULTS:   The patient was fitted with a large Eson nasal mask CPAP was initiated at 5  cmH20 with heated humidity per AASM split night standards and pressure was advanced to 11 cmH20 because of hypopneas, apneas and desaturations.  At a PAP pressure of 11 cmH20, there was a reduction of the AHI to 2/hour with supine NREM sleep achieved and O2 nadir of 92%.   Total recording time (TRT) was 290.5 minutes, with a total sleep time (TST) of 281 minutes. The patient's sleep latency was 2 minutes. REM latency was 53.5 minutes.  The sleep efficiency was 96.7 %.     SLEEP ARCHITECTURE: Wake after sleep was 7 minutes, Stage N1 5.5 minutes, Stage N2 211 minutes, Stage N3 0 minutes and Stage R (REM sleep) 64.5 minutes. The percentages were: Stage N1 2.%, Stage N2 75.1%, Stage N3 was absent, and Stage R (REM sleep) 23.%. The arousals were noted as: 25 were spontaneous, 0 were associated with PLMs, 3 were associated with respiratory events.  RESPIRATORY ANALYSIS:  There were a total of 23 respiratory events: 7 obstructive apneas, 10 central apneas and 0 mixed apneas with a total of 17 apneas and an apnea index (AI) of 3.6. There were 6 hypopneas with a hypopnea index of 1.3 /hour. The patient also had 0 respiratory event related arousals (RERAs).      The total APNEA/HYPOPNEA INDEX  (AHI) was 4.9 /hour and the total RESPIRATORY DISTURBANCE INDEX was 4.9 /hour.  4 events occurred in REM sleep and 19 events in NREM. The REM AHI was 3.7 /hour versus a non-REM AHI of 5.3 /hour. The supine AHI was 2.9 /hour, versus a non-supine AHI of 6.5/hour.  OXYGEN SATURATION & C02:  The wake baseline 02 saturation was 92%, with the lowest being 80%. Time spent below 89% saturation equaled 11 minutes.  PERIODIC LIMB MOVEMENTS:    The patient had a total of 6 Periodic Limb Movements. The Periodic Limb Movement (PLM) index was 1.3 /hour and the PLM Arousal index was 0 /hour.  Post-study, the patient indicated that sleep was better than usual.  IMPRESSION:   Obstructive Sleep Apnea (OSA), severe Dysfunctions associated with sleep stages or arousal from sleep   RECOMMENDATIONS:   This patient has severe obstructive sleep apnea and responded well to CPAP therapy. I will, therefore, start the patient on home CPAP treatment at a pressure of 11 cm via large nasal mask with (heated) humidity. The patient will be advised to be fully compliant with PAP therapy to improve sleep related symptoms and decrease long term cardiovascular risks. Please note that untreated obstructive sleep apnea  may carry additional perioperative morbidity. Patients with significant obstructive sleep apnea should receive perioperative PAP therapy and the surgeons and particularly the anesthesiologist should be informed of the diagnosis and the severity of the sleep disordered breathing. This study shows some abnormal sleep stage percentages; these are nonspecific findings and per se do not signify an intrinsic sleep disorder or a cause for the patient's sleep-related symptoms. Causes include (but are not limited to) the first night effect of the sleep study, circadian rhythm disturbances, medication effect or an underlying mood disorder or medical problem. The patient should be cautioned not to drive, work at heights, or operate dangerous or heavy equipment when tired or sleepy. Review and reiteration of good sleep hygiene measures should be pursued with any patient. The patient will be seen in follow-up in the sleep clinic at [facility] for discussion of the test results, symptom and treatment compliance review, further management strategies, etc. The referring provider will be notified of the test results.  I certify that I have reviewed the entire raw data recording prior to the issuance of this report in accordance with the Standards of Accreditation of the American Academy of Sleep Medicine (AASM)   Star Age, MD, PhD Diplomat, American Board of Neurology and Sleep Medicine (Neurology and Sleep Medicine)

## 2021-03-27 ENCOUNTER — Other Ambulatory Visit: Payer: Self-pay | Admitting: *Deleted

## 2021-03-27 DIAGNOSIS — N289 Disorder of kidney and ureter, unspecified: Secondary | ICD-10-CM

## 2021-03-27 NOTE — Progress Notes (Addendum)
I called Anna [name]. I advised Anna [name] that Dr. Rexene Alberts reviewed their sleep study results and found that you have severe sleep apnea. . Dr. Rexene Alberts recommends that you restart cpap. I reviewed PAP compliance expectations with her.   She  is agreeable to starting a CPAP. I advised her that an order will be sent to a DME, [facility] and they will call the Anna [name] within about one week after they file with the Anna [name]'s insurance. DME/ [facility] will show the Anna [name] how to use the machine, fit for masks, and troubleshoot the CPAP if needed. A follow up appt was made for insurance purposes with Dr. Rexene Alberts when starts CPAP.Marland Kitchen Anna [name] verbalized understanding to arrive 15 minutes early and bring their CPAP. A letter with all of this information in it will be mailed to the Anna [name] as a reminder. I verified with the Anna [name] that the address we have on file is correct. Anna [name] verbalized understanding of results. Anna [name] had no questions at this time but was encouraged to call back if questions arise. I have sent the order to [facility] and have received confirmation that they have received the order.

## 2021-03-28 ENCOUNTER — Encounter: Payer: Self-pay | Admitting: *Deleted

## 2021-03-28 ENCOUNTER — Telehealth: Payer: Self-pay | Admitting: *Deleted

## 2021-03-28 NOTE — Telephone Encounter (Signed)
I called pt and gave her the results of the sleep study, cpap order sent via epic community message, and letter sent to pt. [facility].

## 2021-03-29 NOTE — Telephone Encounter (Signed)
Denyse Amass, RN Got it!   Thanks!         Previous Messages    ----- Message -----  From: Brandon Melnick, RN  Sent: 03/28/2021   5:00 PM EDT  To: Vanessa Ralphs, Marchelle Gearing  Subject: new cpap user                                   Nwe order for cpap (new user).  Marguarite Arbour   Female, 75 y.o., 10/28/1945   MRN:  [id number]  Thank you

## 2021-04-04 ENCOUNTER — Ambulatory Visit (INDEPENDENT_AMBULATORY_CARE_PROVIDER_SITE_OTHER): Payer: Medicare Other | Admitting: Vascular Surgery

## 2021-04-04 ENCOUNTER — Ambulatory Visit (INDEPENDENT_AMBULATORY_CARE_PROVIDER_SITE_OTHER)
Admission: RE | Admit: 2021-04-04 | Discharge: 2021-04-04 | Disposition: A | Discharge status: Home / Self Care | Payer: Medicare Other | Source: Ambulatory Visit | Attending: Vascular Surgery | Admitting: Vascular Surgery

## 2021-04-04 ENCOUNTER — Ambulatory Visit (HOSPITAL_COMMUNITY)
Admission: RE | Admit: 2021-04-04 | Discharge: 2021-04-04 | Disposition: A | Discharge status: Home / Self Care | Payer: Medicare Other | Source: Ambulatory Visit | Attending: Vascular Surgery | Admitting: Vascular Surgery

## 2021-04-04 ENCOUNTER — Other Ambulatory Visit: Payer: Self-pay

## 2021-04-04 VITALS — BP 162/94 | HR 60 | Temp 97.9°F | Resp 16 | Ht 62.0 in | Wt 177.7 lb

## 2021-04-04 DIAGNOSIS — N184 Chronic kidney disease, stage 4 (severe): Secondary | ICD-10-CM | POA: Diagnosis not present

## 2021-04-04 DIAGNOSIS — N289 Disorder of kidney and ureter, unspecified: Secondary | ICD-10-CM

## 2021-04-04 NOTE — H&P (View-Only) (Signed)
Referring Physician: Dr. Royce Macadamia  Patient name: Anna [name] MRN: [id number] DOB: 1946/04/14 Sex: female  REASON FOR CONSULT: Hemodialysis access  HPI: Anna [name] is a 75 y.o. female, furred for placement of long-term hemodialysis access.  She is not currently on hemodialysis.  She is right-handed.  She has never had any prior access procedures.  She has never had a pacemaker or any central venous access.  She does have some baseline numbness and tingling in her hands.  The right side is worse than the left.  Other medical problems include arthritis diabetes hyperlipidemia hypertension all of which are currently stable.  Past Medical History:  Diagnosis Date   Arthritis    Blood transfusion without reported diagnosis    40 plus yrs ago   Cataract    CKD (chronic kidney disease), stage III ([place]) 08/10/2014   Diabetes mellitus    GERD (gastroesophageal reflux disease)    Glaucoma    Hyperlipidemia    Hypertension    Past Surgical History:  Procedure Laterality Date   ABDOMINAL HYSTERECTOMY     COLONOSCOPY     POLYPECTOMY      Family History  Problem Relation Age of Onset   Diabetes Daughter    Hyperlipidemia Mother    Diabetes Mother    Cancer Brother    Hyperlipidemia Brother    Colon cancer Neg Hx    Colon polyps Neg Hx    Rectal cancer Neg Hx    Stomach cancer Neg Hx    Esophageal cancer Neg Hx    Breast cancer Neg Hx     SOCIAL HISTORY: Social History   Socioeconomic History   Marital status: Divorced    Spouse name: Not on file   Number of children: 4   Years of education: 10   Highest education level: Not on file  Occupational History   Occupation: Retired  Tobacco Use   Smoking status: Former    Years: 30.00    Types: Cigarettes    Quit date: 10/09/2014    Years since quitting: 6.4   Smokeless tobacco: Never   Tobacco comments:    1 cig a week  Vaping Use   Vaping Use: Never used  Substance and Sexual Activity   Alcohol  use: Yes    Alcohol/week: 1.0 standard drink    Types: 1 Cans of beer per week    Comment: occasional beer   Drug use: No   Sexual activity: Not on file  Other Topics Concern   Not on file  Social History Narrative   Health Care POA:    Emergency Contact: Florena Kozma (daughter) [phone]   End of Life Plan: Pt will bring copy of living will to next visit   Who lives with you: By herself   Any pets: 0   Diet: Pt reports recently reducing her intake of starches and carbohydrates. Pt reports eating more fresh fruits and vegetables to help reduce her A1C.     Exercise: Pt reports using the treadmill for 60 minutes, 2-3 times per week.    Seatbelts: Pt reports wearing a seatbelt when in vehicle.   Nancy Fetter Exposure/Protection: Pt denies wearing any protection but mentioned she will start due to side effects of her recently prescribed medication.    Hobbies: Reading and going to movies with family.   Social Determinants of Health   Financial Resource Strain: Not on file  Food Insecurity: Not on file  Transportation Needs: Not on file  Physical Activity: Not on file  Stress: Not on file  Social Connections: Not on file  Intimate Partner Violence: Not on file    Allergies  Allergen Reactions   Metformin Diarrhea and Other (See Comments)    GI upset     Current Outpatient Medications  Medication Sig Dispense Refill   acetaminophen (TYLENOL) 500 MG tablet Take 500-1,000 mg by mouth every 6 (six) hours as needed for moderate pain or mild pain.     aspirin 81 MG tablet Take 1 tablet (81 mg total) by mouth daily. 30 tablet 11   atorvastatin (LIPITOR) 40 MG tablet Take 1 tablet (40 mg total) by mouth daily. 90 tablet 0   Blood Glucose Monitoring Suppl (ONE TOUCH ULTRA 2) w/Device KIT 1 Device by Does not apply route once. 1 each 0   canagliflozin (INVOKANA) 300 MG TABS tablet Take 1 tablet (300 mg total) by mouth daily before breakfast. 90 tablet 1   cephALEXin (KEFLEX) 500 MG capsule  Take 1 capsule (500 mg total) by mouth 2 (two) times daily. 20 capsule 0   Cholecalciferol (VITAMIN D-3) 25 MCG (1000 UT) CAPS Take 1,000 Units by mouth daily.     dorzolamide-timolol (COSOPT) 22.3-6.8 MG/ML ophthalmic solution Place 1 drop into both eyes 2 (two) times daily.     escitalopram (LEXAPRO) 10 MG tablet Take 10 mg by mouth daily.     gabapentin (NEURONTIN) 100 MG capsule Take 100 mg by mouth 3 (three) times daily as needed (for neuropathy).     glucose blood (ONE TOUCH ULTRA TEST) test strip Check sugar 3-4 x daily as needed 200 each 3   Insulin Glargine (LANTUS SOLOSTAR) 100 UNIT/ML Solostar Pen INJECT 60 UNITS SUBCUTANEOUSLY ONCE DAILY (Patient taking differently: Inject 70 Units into the skin daily before breakfast.) 15 mL 0   Insulin Pen Needle (B-D UF III MINI PEN NEEDLES) 31G X 5 MM MISC Use with Solostar Lantus pen needles 100 each 5   lubiprostone (AMITIZA) 24 MCG capsule Take 24 mcg by mouth daily with breakfast.     Melatonin 10 MG CAPS Take 10 mg by mouth at bedtime as needed (sleep).     metoprolol tartrate (LOPRESSOR) 25 MG tablet Take 25 mg by mouth 2 (two) times daily.     ONETOUCH DELICA LANCETS 64B MISC Test blood glucose 3-4 times daily as needed. ICD-10 code: E11.40. 100 each 5   pantoprazole (PROTONIX) 40 MG tablet Take 1 tablet (40 mg total) by mouth daily. (Patient taking differently: Take 40 mg by mouth daily as needed (for heartburn).) 30 tablet 1   quinapril (ACCUPRIL) 20 MG tablet Take 20 mg by mouth daily.     quinapril-hydrochlorothiazide (ACCURETIC) 20-25 MG tablet TAKE 1 TABLET BY MOUTH ONCE DAILY 90 tablet 3   Semaglutide,0.25 or 0.5MG/DOS, 2 MG/1.5ML SOPN Inject 0.2 mg into the skin once a week.     HYDROcodone-acetaminophen (NORCO/VICODIN) 5-325 MG tablet Take 1 tablet by mouth every 8 (eight) hours as needed (for pain). (Patient not taking: Reported on 04/04/2021)     No current facility-administered medications for this visit.    ROS:   General:   No weight loss, Fever, chills  HEENT: No recent headaches, no nasal bleeding, no visual changes, no sore throat  Neurologic: No dizziness, blackouts, seizures. No recent symptoms of stroke or mini- stroke. No recent episodes of slurred speech, or temporary blindness.  Cardiac: No recent episodes of chest pain/pressure, no shortness of breath at rest.  No shortness  of breath with exertion.  Denies history of atrial fibrillation or irregular heartbeat  Vascular: No history of rest pain in feet.  No history of claudication.  No history of non-healing ulcer, No history of DVT   Pulmonary: No home oxygen, no productive cough, no hemoptysis,  No asthma or wheezing  Musculoskeletal:  _0  Arthritis, _1  Low back pain,  _2  Joint pain  Hematologic:No history of hypercoagulable state.  No history of easy bleeding.  No history of anemia  Gastrointestinal: No hematochezia or melena,  No gastroesophageal reflux, no trouble swallowing  Urinary: _3  chronic Kidney disease, _4  on HD - _5  MWF or _6  TTHS, _7  Burning with urination, _8  Frequent urination, _9  Difficulty urinating;   Skin: No rashes  Psychological: No history of anxiety,  No history of depression   Physical Examination  Vitals:   04/04/21 1409  BP: (!) 162/94  Pulse: 60  Resp: 16  Temp: 97.9 F (36.6 C)  TempSrc: Temporal  SpO2: 96%  Weight: 177 lb 11.2 oz (80.6 kg)  Height: _10  (1.575 m)    Body mass index is 32.5 kg/m.  General:  Alert and oriented, no acute distress HEENT: Normal Neck: No JVD Cardiac: Regular Rate and Rhythm  Skin: No rash Extremity Pulses:  2+ radial, brachial pulses bilaterally Musculoskeletal: No deformity or edema  Neurologic: Upper and lower extremity motor 5/5 and symmetric  DATA:  She had a vein mapping ultrasound today which showed the right upper arm cephalic was 5 mm.  Left forearm to upper arm cephalic was 3 to 4 mm.  Right basilic was 6 mm left basilic 4 mm radial artery was  quite small 2 mm or less bilaterally.  Brachial artery was 4 mm diameter bilaterally no obstruction  ASSESSMENT: Patient needs long-term hemodialysis access.  Had a lengthy discussion with the patient and her daughter today regarding whether or not to use her right arm left arm and fistula options.  Her best vein is in her right upper arm cephalic.  Although she is right-hand dominant she does have some numbness and tingling in this right hand but also has some in the left.  We did discuss the possibility that shares some risk of ischemic steal.  Usually this will occur within the first few days.  I told them I would prefer to use her best vein for her dialysis access but knowing that they need to look for signs and symptoms of steal within the first few days after her operation.  He understands the risk of this is about 5%.  Other risks include not limited to bleeding infection on maturation of the fistula 10 to 15% were discussed with patient and her daughter today.   PLAN: Right brachiocephalic AV fistula scheduled for Tuesday, April 16, 2021.   Ruta Hinds, MD [facility] Office: [phone]

## 2021-04-04 NOTE — Progress Notes (Signed)
Referring Physician: Dr. Royce Macadamia  Patient name: Anna [name] MRN: [id number] DOB: 1946/04/14 Sex: female  REASON FOR CONSULT: Hemodialysis access  HPI: Anna [name] is a 75 y.o. female, furred for placement of long-term hemodialysis access.  She is not currently on hemodialysis.  She is right-handed.  She has never had any prior access procedures.  She has never had a pacemaker or any central venous access.  She does have some baseline numbness and tingling in her hands.  The right side is worse than the left.  Other medical problems include arthritis diabetes hyperlipidemia hypertension all of which are currently stable.  Past Medical History:  Diagnosis Date   Arthritis    Blood transfusion without reported diagnosis    40 plus yrs ago   Cataract    CKD (chronic kidney disease), stage III ([place]) 08/10/2014   Diabetes mellitus    GERD (gastroesophageal reflux disease)    Glaucoma    Hyperlipidemia    Hypertension    Past Surgical History:  Procedure Laterality Date   ABDOMINAL HYSTERECTOMY     COLONOSCOPY     POLYPECTOMY      Family History  Problem Relation Age of Onset   Diabetes Daughter    Hyperlipidemia Mother    Diabetes Mother    Cancer Brother    Hyperlipidemia Brother    Colon cancer Neg Hx    Colon polyps Neg Hx    Rectal cancer Neg Hx    Stomach cancer Neg Hx    Esophageal cancer Neg Hx    Breast cancer Neg Hx     SOCIAL HISTORY: Social History   Socioeconomic History   Marital status: Divorced    Spouse name: Not on file   Number of children: 4   Years of education: 10   Highest education level: Not on file  Occupational History   Occupation: Retired  Tobacco Use   Smoking status: Former    Years: 30.00    Types: Cigarettes    Quit date: 10/09/2014    Years since quitting: 6.4   Smokeless tobacco: Never   Tobacco comments:    1 cig a week  Vaping Use   Vaping Use: Never used  Substance and Sexual Activity   Alcohol  use: Yes    Alcohol/week: 1.0 standard drink    Types: 1 Cans of beer per week    Comment: occasional beer   Drug use: No   Sexual activity: Not on file  Other Topics Concern   Not on file  Social History Narrative   Health Care POA:    Emergency Contact: Florena Kozma (daughter) [phone]   End of Life Plan: Pt will bring copy of living will to next visit   Who lives with you: By herself   Any pets: 0   Diet: Pt reports recently reducing her intake of starches and carbohydrates. Pt reports eating more fresh fruits and vegetables to help reduce her A1C.     Exercise: Pt reports using the treadmill for 60 minutes, 2-3 times per week.    Seatbelts: Pt reports wearing a seatbelt when in vehicle.   Nancy Fetter Exposure/Protection: Pt denies wearing any protection but mentioned she will start due to side effects of her recently prescribed medication.    Hobbies: Reading and going to movies with family.   Social Determinants of Health   Financial Resource Strain: Not on file  Food Insecurity: Not on file  Transportation Needs: Not on file  Physical Activity: Not on file  Stress: Not on file  Social Connections: Not on file  Intimate Partner Violence: Not on file    Allergies  Allergen Reactions   Metformin Diarrhea and Other (See Comments)    GI upset     Current Outpatient Medications  Medication Sig Dispense Refill   acetaminophen (TYLENOL) 500 MG tablet Take 500-1,000 mg by mouth every 6 (six) hours as needed for moderate pain or mild pain.     aspirin 81 MG tablet Take 1 tablet (81 mg total) by mouth daily. 30 tablet 11   atorvastatin (LIPITOR) 40 MG tablet Take 1 tablet (40 mg total) by mouth daily. 90 tablet 0   Blood Glucose Monitoring Suppl (ONE TOUCH ULTRA 2) w/Device KIT 1 Device by Does not apply route once. 1 each 0   canagliflozin (INVOKANA) 300 MG TABS tablet Take 1 tablet (300 mg total) by mouth daily before breakfast. 90 tablet 1   cephALEXin (KEFLEX) 500 MG capsule  Take 1 capsule (500 mg total) by mouth 2 (two) times daily. 20 capsule 0   Cholecalciferol (VITAMIN D-3) 25 MCG (1000 UT) CAPS Take 1,000 Units by mouth daily.     dorzolamide-timolol (COSOPT) 22.3-6.8 MG/ML ophthalmic solution Place 1 drop into both eyes 2 (two) times daily.     escitalopram (LEXAPRO) 10 MG tablet Take 10 mg by mouth daily.     gabapentin (NEURONTIN) 100 MG capsule Take 100 mg by mouth 3 (three) times daily as needed (for neuropathy).     glucose blood (ONE TOUCH ULTRA TEST) test strip Check sugar 3-4 x daily as needed 200 each 3   Insulin Glargine (LANTUS SOLOSTAR) 100 UNIT/ML Solostar Pen INJECT 60 UNITS SUBCUTANEOUSLY ONCE DAILY (Patient taking differently: Inject 70 Units into the skin daily before breakfast.) 15 mL 0   Insulin Pen Needle (B-D UF III MINI PEN NEEDLES) 31G X 5 MM MISC Use with Solostar Lantus pen needles 100 each 5   lubiprostone (AMITIZA) 24 MCG capsule Take 24 mcg by mouth daily with breakfast.     Melatonin 10 MG CAPS Take 10 mg by mouth at bedtime as needed (sleep).     metoprolol tartrate (LOPRESSOR) 25 MG tablet Take 25 mg by mouth 2 (two) times daily.     ONETOUCH DELICA LANCETS 64B MISC Test blood glucose 3-4 times daily as needed. ICD-10 code: E11.40. 100 each 5   pantoprazole (PROTONIX) 40 MG tablet Take 1 tablet (40 mg total) by mouth daily. (Patient taking differently: Take 40 mg by mouth daily as needed (for heartburn).) 30 tablet 1   quinapril (ACCUPRIL) 20 MG tablet Take 20 mg by mouth daily.     quinapril-hydrochlorothiazide (ACCURETIC) 20-25 MG tablet TAKE 1 TABLET BY MOUTH ONCE DAILY 90 tablet 3   Semaglutide,0.25 or 0.5MG/DOS, 2 MG/1.5ML SOPN Inject 0.2 mg into the skin once a week.     HYDROcodone-acetaminophen (NORCO/VICODIN) 5-325 MG tablet Take 1 tablet by mouth every 8 (eight) hours as needed (for pain). (Patient not taking: Reported on 04/04/2021)     No current facility-administered medications for this visit.    ROS:   General:   No weight loss, Fever, chills  HEENT: No recent headaches, no nasal bleeding, no visual changes, no sore throat  Neurologic: No dizziness, blackouts, seizures. No recent symptoms of stroke or mini- stroke. No recent episodes of slurred speech, or temporary blindness.  Cardiac: No recent episodes of chest pain/pressure, no shortness of breath at rest.  No shortness  of breath with exertion.  Denies history of atrial fibrillation or irregular heartbeat  Vascular: No history of rest pain in feet.  No history of claudication.  No history of non-healing ulcer, No history of DVT   Pulmonary: No home oxygen, no productive cough, no hemoptysis,  No asthma or wheezing  Musculoskeletal:  _0  Arthritis, _1  Low back pain,  _2  Joint pain  Hematologic:No history of hypercoagulable state.  No history of easy bleeding.  No history of anemia  Gastrointestinal: No hematochezia or melena,  No gastroesophageal reflux, no trouble swallowing  Urinary: _3  chronic Kidney disease, _4  on HD - _5  MWF or _6  TTHS, _7  Burning with urination, _8  Frequent urination, _9  Difficulty urinating;   Skin: No rashes  Psychological: No history of anxiety,  No history of depression   Physical Examination  Vitals:   04/04/21 1409  BP: (!) 162/94  Pulse: 60  Resp: 16  Temp: 97.9 F (36.6 C)  TempSrc: Temporal  SpO2: 96%  Weight: 177 lb 11.2 oz (80.6 kg)  Height: _10  (1.575 m)    Body mass index is 32.5 kg/m.  General:  Alert and oriented, no acute distress HEENT: Normal Neck: No JVD Cardiac: Regular Rate and Rhythm  Skin: No rash Extremity Pulses:  2+ radial, brachial pulses bilaterally Musculoskeletal: No deformity or edema  Neurologic: Upper and lower extremity motor 5/5 and symmetric  DATA:  She had a vein mapping ultrasound today which showed the right upper arm cephalic was 5 mm.  Left forearm to upper arm cephalic was 3 to 4 mm.  Right basilic was 6 mm left basilic 4 mm radial artery was  quite small 2 mm or less bilaterally.  Brachial artery was 4 mm diameter bilaterally no obstruction  ASSESSMENT: Patient needs long-term hemodialysis access.  Had a lengthy discussion with the patient and her daughter today regarding whether or not to use her right arm left arm and fistula options.  Her best vein is in her right upper arm cephalic.  Although she is right-hand dominant she does have some numbness and tingling in this right hand but also has some in the left.  We did discuss the possibility that shares some risk of ischemic steal.  Usually this will occur within the first few days.  I told them I would prefer to use her best vein for her dialysis access but knowing that they need to look for signs and symptoms of steal within the first few days after her operation.  He understands the risk of this is about 5%.  Other risks include not limited to bleeding infection on maturation of the fistula 10 to 15% were discussed with patient and her daughter today.   PLAN: Right brachiocephalic AV fistula scheduled for Tuesday, April 16, 2021.   Ruta Hinds, MD [facility] Office: [phone]

## 2021-04-05 ENCOUNTER — Other Ambulatory Visit: Payer: Self-pay

## 2021-04-11 ENCOUNTER — Ambulatory Visit: Payer: Medicare Other | Admitting: Family Medicine

## 2021-04-15 ENCOUNTER — Other Ambulatory Visit: Payer: Self-pay

## 2021-04-15 ENCOUNTER — Encounter (HOSPITAL_COMMUNITY): Payer: Self-pay | Admitting: Vascular Surgery

## 2021-04-15 NOTE — Progress Notes (Signed)
PCP - Dustin Folks, FNP Cardiologist - denies EKG - 12/17/20 Chest x-ray -  ECHO -  Cardiac Cath -  CPAP -   Fasting Blood Sugar:  150s Checks Blood Sugar:  1x/day  Blood Thinner Instructions:  Aspirin Instructions: continue ASA per VVS protocol   ERAS Protcol - n/a  COVID TEST- n/a  Anesthesia review: n/a  -------------  SDW INSTRUCTIONS:  Your procedure is scheduled on Tuesday 7/26. Please report to [facility] Main Entrance "A" at 06:45 A.M., and check in at the Admitting office. Call this number if you have problems the morning of surgery: [phone]   Remember: Do not eat or drink after midnight the night before your surgery   Medications to take morning of surgery with a sip of water include: NONE (pt takes bubble pack)  ASA  If needed: Tylenol    ** PLEASE check your blood sugar the morning of your surgery when you wake up and every 2 hours until you get to the Short Stay unit.  If your blood sugar is less than 70 mg/dL, you will need to treat for low blood sugar: Do not take insulin. Treat a low blood sugar (less than 70 mg/dL) with  cup of clear juice (cranberry or apple), 4 glucose tablets, OR glucose gel. Recheck blood sugar in 15 minutes after treatment (to make sure it is greater than 70 mg/dL). If your blood sugar is not greater than 70 mg/dL on recheck, call [phone] for further instructions.   7/26: 12.5 units  As of today, STOP taking any Aleve, Naproxen, Ibuprofen, Motrin, Advil, Goody's, BC's, all herbal medications, fish oil, and all vitamins.    The Morning of Surgery Do not wear jewelry, make-up or nail polish. Do not wear lotions, powders, or perfumes, or deodorant Do not shave 48 hours prior to surgery.   Do not bring valuables to the hospital. [facility] is not responsible for any belongings or valuables.  If you are a smoker, DO NOT Smoke 24 hours prior to surgery  If you wear a CPAP at night please bring your mask the morning  of surgery   Remember that you must have someone to transport you home after your surgery, and remain with you for 24 hours if you are discharged the same day.  Please bring cases for contacts, glasses, hearing aids, dentures or bridgework because it cannot be worn into surgery.   Patients discharged the day of surgery will not be allowed to drive home.   Please shower the NIGHT BEFORE/MORNING OF SURGERY (use antibacterial soap like DIAL soap if possible). Wear comfortable clothes the morning of surgery. Oral Hygiene is also important to reduce your risk of infection.  Remember - BRUSH YOUR TEETH THE MORNING OF SURGERY WITH YOUR REGULAR TOOTHPASTE  Patient denies shortness of breath, fever, cough and chest pain.

## 2021-04-15 NOTE — Anesthesia Preprocedure Evaluation (Addendum)
Anesthesia Evaluation  Patient identified by MRN, date of birth, ID band Patient awake    Reviewed: Allergy & Precautions  Airway Mallampati: II  TM Distance: >3 FB     Dental   Pulmonary sleep apnea , former smoker,    breath sounds clear to auscultation       Cardiovascular hypertension,  Rhythm:Regular Rate:Normal     Neuro/Psych TIA Neuromuscular disease    GI/Hepatic GERD  ,  Endo/Other  diabetes  Renal/GU Renal disease     Musculoskeletal   Abdominal   Peds  Hematology   Anesthesia Other Findings   Reproductive/Obstetrics                            Anesthesia Physical Anesthesia Plan  ASA: 3  Anesthesia Plan: MAC   Post-op Pain Management:    Induction: Intravenous  PONV Risk Score and Plan: Propofol infusion and Ondansetron  Airway Management Planned: LMA  Additional Equipment:   Intra-op Plan:   Post-operative Plan:   Informed Consent: I have reviewed the patients History and Physical, chart, labs and discussed the procedure including the risks, benefits and alternatives for the proposed anesthesia with the patient or authorized representative who has indicated his/her understanding and acceptance.     Dental advisory given  Plan Discussed with: CRNA and Anesthesiologist  Anesthesia Plan Comments:        Anesthesia Quick Evaluation

## 2021-04-16 ENCOUNTER — Ambulatory Visit (HOSPITAL_COMMUNITY): Payer: Medicare Other | Admitting: Anesthesiology

## 2021-04-16 ENCOUNTER — Ambulatory Visit (HOSPITAL_COMMUNITY)
Admission: RE | Admit: 2021-04-16 | Discharge: 2021-04-16 | Disposition: A | Discharge status: Home / Self Care | Payer: Medicare Other | Attending: Vascular Surgery | Admitting: Vascular Surgery

## 2021-04-16 ENCOUNTER — Other Ambulatory Visit: Payer: Self-pay

## 2021-04-16 ENCOUNTER — Encounter (HOSPITAL_COMMUNITY): Payer: Self-pay | Admitting: Vascular Surgery

## 2021-04-16 ENCOUNTER — Encounter (HOSPITAL_COMMUNITY)
Admission: RE | Disposition: A | Discharge status: Home / Self Care | Payer: Self-pay | Source: Home / Self Care | Attending: Vascular Surgery

## 2021-04-16 DIAGNOSIS — N185 Chronic kidney disease, stage 5: Secondary | ICD-10-CM | POA: Diagnosis not present

## 2021-04-16 DIAGNOSIS — Z888 Allergy status to other drugs, medicaments and biological substances status: Secondary | ICD-10-CM | POA: Diagnosis not present

## 2021-04-16 DIAGNOSIS — Z87891 Personal history of nicotine dependence: Secondary | ICD-10-CM | POA: Diagnosis not present

## 2021-04-16 DIAGNOSIS — I120 Hypertensive chronic kidney disease with stage 5 chronic kidney disease or end stage renal disease: Secondary | ICD-10-CM | POA: Diagnosis present

## 2021-04-16 DIAGNOSIS — N183 Chronic kidney disease, stage 3 unspecified: Secondary | ICD-10-CM

## 2021-04-16 DIAGNOSIS — Z794 Long term (current) use of insulin: Secondary | ICD-10-CM | POA: Diagnosis not present

## 2021-04-16 DIAGNOSIS — Z79899 Other long term (current) drug therapy: Secondary | ICD-10-CM | POA: Insufficient documentation

## 2021-04-16 DIAGNOSIS — E1122 Type 2 diabetes mellitus with diabetic chronic kidney disease: Secondary | ICD-10-CM | POA: Diagnosis not present

## 2021-04-16 DIAGNOSIS — E785 Hyperlipidemia, unspecified: Secondary | ICD-10-CM | POA: Diagnosis not present

## 2021-04-16 DIAGNOSIS — N186 End stage renal disease: Secondary | ICD-10-CM | POA: Diagnosis not present

## 2021-04-16 DIAGNOSIS — Z7982 Long term (current) use of aspirin: Secondary | ICD-10-CM | POA: Insufficient documentation

## 2021-04-16 DIAGNOSIS — Z7984 Long term (current) use of oral hypoglycemic drugs: Secondary | ICD-10-CM | POA: Diagnosis not present

## 2021-04-16 HISTORY — DX: Sleep apnea, unspecified: G47.30

## 2021-04-16 HISTORY — PX: AV FISTULA PLACEMENT: SHX1204

## 2021-04-16 LAB — POCT I-STAT, CHEM 8
BUN: 24 mg/dL — ABNORMAL HIGH (ref 8–23)
Calcium, Ion: 1.16 mmol/L (ref 1.15–1.40)
Chloride: 109 mmol/L (ref 98–111)
Creatinine, Ser: 2.5 mg/dL — ABNORMAL HIGH (ref 0.44–1.00)
Glucose, Bld: 106 mg/dL — ABNORMAL HIGH (ref 70–99)
HCT: 32 % — ABNORMAL LOW (ref 36.0–46.0)
Hemoglobin: 10.9 g/dL — ABNORMAL LOW (ref 12.0–15.0)
Potassium: 3.9 mmol/L (ref 3.5–5.1)
Sodium: 142 mmol/L (ref 135–145)
TCO2: 23 mmol/L (ref 22–32)

## 2021-04-16 LAB — GLUCOSE, CAPILLARY
Glucose-Capillary: 113 mg/dL — ABNORMAL HIGH (ref 70–99)
Glucose-Capillary: 125 mg/dL — ABNORMAL HIGH (ref 70–99)

## 2021-04-16 SURGERY — ARTERIOVENOUS (AV) FISTULA CREATION
Anesthesia: Monitor Anesthesia Care | Laterality: Right

## 2021-04-16 MED ORDER — SODIUM CHLORIDE 0.9 % IV SOLN: INTRAVENOUS | Status: DC

## 2021-04-16 MED ORDER — LIDOCAINE 2% (20 MG/ML) 5 ML SYRINGE
INTRAMUSCULAR | Status: AC
  Filled 2021-04-16: qty 5

## 2021-04-16 MED ORDER — CEFAZOLIN SODIUM-DEXTROSE 2-4 GM/100ML-% IV SOLN
2.0000 g | INTRAVENOUS | Status: AC
  Administered 2021-04-16: 2 g via INTRAVENOUS
  Filled 2021-04-16: qty 100

## 2021-04-16 MED ORDER — ORAL CARE MOUTH RINSE: 15.0000 mL | Freq: Once | OROMUCOSAL | Status: AC

## 2021-04-16 MED ORDER — PROPOFOL 500 MG/50ML IV EMUL
INTRAVENOUS | Status: DC | PRN
Start: 2021-04-16 — End: 2021-04-16
  Administered 2021-04-16: 100 ug/kg/min via INTRAVENOUS

## 2021-04-16 MED ORDER — HEPARIN 6000 UNIT IRRIGATION SOLUTION
Status: DC | PRN
  Administered 2021-04-16: 1

## 2021-04-16 MED ORDER — FENTANYL CITRATE (PF) 250 MCG/5ML IJ SOLN
INTRAMUSCULAR | Status: AC
  Filled 2021-04-16: qty 5

## 2021-04-16 MED ORDER — MIDAZOLAM HCL 2 MG/2ML IJ SOLN
INTRAMUSCULAR | Status: DC | PRN
  Administered 2021-04-16: 2 mg via INTRAVENOUS

## 2021-04-16 MED ORDER — LIDOCAINE-EPINEPHRINE 1 %-1:100000 IJ SOLN
INTRAMUSCULAR | Status: DC | PRN
  Administered 2021-04-16: 12 mL

## 2021-04-16 MED ORDER — PROPOFOL 10 MG/ML IV BOLUS
INTRAVENOUS | Status: AC
  Filled 2021-04-16: qty 20

## 2021-04-16 MED ORDER — HYDROCODONE-ACETAMINOPHEN 5-325 MG PO TABS: 1.0000 | ORAL_TABLET | Freq: Four times a day (QID) | ORAL | 0 refills | Status: DC | PRN

## 2021-04-16 MED ORDER — LIDOCAINE HCL (CARDIAC) PF 100 MG/5ML IV SOSY
PREFILLED_SYRINGE | INTRAVENOUS | Status: DC | PRN
  Administered 2021-04-16: 50 mg via INTRATRACHEAL

## 2021-04-16 MED ORDER — CHLORHEXIDINE GLUCONATE 0.12 % MT SOLN
15.0000 mL | Freq: Once | OROMUCOSAL | Status: AC
  Administered 2021-04-16: 15 mL via OROMUCOSAL
  Filled 2021-04-16: qty 15

## 2021-04-16 MED ORDER — CHLORHEXIDINE GLUCONATE 4 % EX LIQD: 60.0000 mL | Freq: Once | CUTANEOUS | Status: DC

## 2021-04-16 MED ORDER — METOPROLOL SUCCINATE ER 50 MG PO TB24
50.0000 mg | ORAL_TABLET | Freq: Once | ORAL | Status: AC
  Filled 2021-04-16: qty 1

## 2021-04-16 MED ORDER — ONDANSETRON HCL 4 MG/2ML IJ SOLN
INTRAMUSCULAR | Status: AC
  Filled 2021-04-16: qty 2

## 2021-04-16 MED ORDER — METOPROLOL SUCCINATE ER 25 MG PO TB24
ORAL_TABLET | ORAL | Status: AC
  Administered 2021-04-16: 50 mg via ORAL
  Filled 2021-04-16: qty 2

## 2021-04-16 MED ORDER — LIDOCAINE-EPINEPHRINE 1 %-1:100000 IJ SOLN
INTRAMUSCULAR | Status: AC
  Filled 2021-04-16: qty 1

## 2021-04-16 MED ORDER — DEXAMETHASONE SODIUM PHOSPHATE 10 MG/ML IJ SOLN
INTRAMUSCULAR | Status: AC
  Filled 2021-04-16: qty 1

## 2021-04-16 MED ORDER — MIDAZOLAM HCL 2 MG/2ML IJ SOLN
INTRAMUSCULAR | Status: AC
  Filled 2021-04-16: qty 2

## 2021-04-16 MED ORDER — SODIUM CHLORIDE 0.9 % IV SOLN: INTRAVENOUS | Status: DC | PRN

## 2021-04-16 MED ORDER — HEPARIN 6000 UNIT IRRIGATION SOLUTION
Status: AC
  Filled 2021-04-16: qty 500

## 2021-04-16 MED ORDER — FENTANYL CITRATE (PF) 100 MCG/2ML IJ SOLN: 25.0000 ug | INTRAMUSCULAR | Status: DC | PRN

## 2021-04-16 MED ORDER — 0.9 % SODIUM CHLORIDE (POUR BTL) OPTIME
TOPICAL | Status: DC | PRN
  Administered 2021-04-16: 1000 mL

## 2021-04-16 MED ORDER — GLYCOPYRROLATE 0.2 MG/ML IJ SOLN
INTRAMUSCULAR | Status: DC | PRN
  Administered 2021-04-16: .2 mg via INTRAVENOUS

## 2021-04-16 MED ORDER — GLYCOPYRROLATE PF 0.2 MG/ML IJ SOSY
PREFILLED_SYRINGE | INTRAMUSCULAR | Status: AC
  Filled 2021-04-16: qty 1

## 2021-04-16 SURGICAL SUPPLY — 32 items
ADH SKN CLS APL DERMABOND .7 (GAUZE/BANDAGES/DRESSINGS) ×1
ARMBAND PINK RESTRICT EXTREMIT (MISCELLANEOUS) ×2 IMPLANT
BAG COUNTER SPONGE SURGICOUNT (BAG) ×2 IMPLANT
BAG SPNG CNTER NS LX DISP (BAG) ×1
CANISTER SUCT 3000ML PPV (MISCELLANEOUS) ×2 IMPLANT
CLIP LIGATING EXTRA MED SLVR (CLIP) ×2 IMPLANT
CLIP LIGATING EXTRA SM BLUE (MISCELLANEOUS) ×2 IMPLANT
CLIP VESOCCLUDE MED 6/CT (CLIP) ×2 IMPLANT
CLIP VESOCCLUDE SM WIDE 6/CT (CLIP) ×2 IMPLANT
COVER PROBE W GEL 5X96 (DRAPES) ×1 IMPLANT
DERMABOND ADVANCED (GAUZE/BANDAGES/DRESSINGS) ×1
DERMABOND ADVANCED .7 DNX12 (GAUZE/BANDAGES/DRESSINGS) ×1 IMPLANT
ELECT REM PT RETURN 9FT ADLT (ELECTROSURGICAL) ×2
ELECTRODE REM PT RTRN 9FT ADLT (ELECTROSURGICAL) ×1 IMPLANT
GLOVE SURG ENC MOIS LTX SZ7.5 (GLOVE) ×2 IMPLANT
GOWN STRL REUS W/ TWL LRG LVL3 (GOWN DISPOSABLE) ×2 IMPLANT
GOWN STRL REUS W/ TWL XL LVL3 (GOWN DISPOSABLE) ×1 IMPLANT
GOWN STRL REUS W/TWL LRG LVL3 (GOWN DISPOSABLE) ×2
GOWN STRL REUS W/TWL XL LVL3 (GOWN DISPOSABLE) ×6
INSERT FOGARTY SM (MISCELLANEOUS) IMPLANT
KIT BASIN OR (CUSTOM PROCEDURE TRAY) ×2 IMPLANT
KIT TURNOVER KIT B (KITS) ×2 IMPLANT
NS IRRIG 1000ML POUR BTL (IV SOLUTION) ×2 IMPLANT
PACK CV ACCESS (CUSTOM PROCEDURE TRAY) ×2 IMPLANT
PAD ARMBOARD 7.5X6 YLW CONV (MISCELLANEOUS) ×4 IMPLANT
SUT MNCRL AB 4-0 PS2 18 (SUTURE) ×2 IMPLANT
SUT PROLENE 6 0 BV (SUTURE) ×3 IMPLANT
SUT VIC AB 3-0 SH 27 (SUTURE) ×2
SUT VIC AB 3-0 SH 27X BRD (SUTURE) ×1 IMPLANT
TOWEL GREEN STERILE (TOWEL DISPOSABLE) ×2 IMPLANT
UNDERPAD 30X36 HEAVY ABSORB (UNDERPADS AND DIAPERS) ×2 IMPLANT
WATER STERILE IRR 1000ML POUR (IV SOLUTION) ×2 IMPLANT

## 2021-04-16 NOTE — Anesthesia Procedure Notes (Signed)
Procedure Name: MAC Date/Time: 04/16/2021 7:34 AM Performed by: Claris Che, CRNA Pre-anesthesia Checklist: Patient identified, Emergency Drugs available, Suction available, Patient being monitored and Timeout performed Patient Re-evaluated:Patient Re-evaluated prior to induction Oxygen Delivery Method: Simple face mask Ventilation: Oral airway inserted - appropriate to patient size Dental Injury: Teeth and Oropharynx as per pre-operative assessment

## 2021-04-16 NOTE — Op Note (Signed)
    Patient name: Anna [name] MRN: [id number] DOB: 04/15/1946 Sex: female  04/16/2021 Pre-operative Diagnosis: chronic kidney disease Post-operative diagnosis:  Same Surgeon:  Eda Paschal. Donzetta Matters, MD Assistant: Shea Evans, MS-3 Procedure Performed: Right cephalic vein to brachial artery av fistula creation  Indications: 75 year old female with chronic kidney disease now indicated for permanent dialysis access.  An assistant was necessary to expedite the case and for suction, retraction and assistance with anastomosis.  Findings: There is a large cephalic vein in the branch by the antecubitum.  1 branch measured at least 3 and half millimeters this was sewn end to side to the brachial artery prior to its bifurcation.  Completion was a strong thrill in the fistula and a palpable and a palpable radial artery pulse the wrist, both of these were confirmed with Doppler.   Procedure:  The patient was identified in the holding area and taken to the operating was placed supine on the operative table and MAC anesthesia was induced.  She was sterilely prepped draped in the right upper extremity in the usual fashion, antibiotics were ministered and a timeout was called.  Ultrasound was used to identify the brachial artery above the bifurcation as well as a large cephalic vein.  A transverse incision was made after 1% lidocaine with epinephrine was instilled.  The vein was dissected out more for orientation of branch of the right between ties.  We dissected through the deep fascia of the brachial artery and placed a vessel loop around this.  The vein was then clamped distally and transected flush heparinized saline and clamped and spatulated.  We tied it off distally.  The artery was then clamped distally proximally opened longitudinally flushed with heparinized saline in both directions.  The vein was then sewn into seven 6-0 Prolene suture.  Prior completion without flushing all directions.  Upon  completion there was a very strong thrill in the fistula.  We freed up some of the soft tissue around the fistula to allow to seat nicely.  We irrigated the wound and obtain hemostasis.  We then closed in layers with Vicryl and Monocryl.  Dermabond placed to the level of the skin.  She was awakened from anesthesia having tolerated procedure well any complication.  All counts were correct at completion.  EBL: 10 cc     Daegon Deiss C. Donzetta Matters, MD [facility] Office: [phone] Pager: [phone]

## 2021-04-16 NOTE — Anesthesia Postprocedure Evaluation (Signed)
Anesthesia Post Note  Patient: Anna [name]  Procedure(s) Performed: RIGHT BRACHIOCEPHALIC ARTERIOVENOUS (AV) FISTULA CREATION (Right)     Patient location during evaluation: PACU Anesthesia Type: MAC Level of consciousness: awake Pain management: pain level controlled Vital Signs Assessment: post-procedure vital signs reviewed and stable Respiratory status: spontaneous breathing Cardiovascular status: stable Postop Assessment: no apparent nausea or vomiting Anesthetic complications: no   No notable events documented.  Last Vitals:  Vitals:   04/16/21 0930 04/16/21 0945  BP: (!) 169/83 (!) 163/87  Pulse: 71 71  Resp: 15 15  Temp: 36.6 C   SpO2: 94% 95%    Last Pain:  Vitals:   04/16/21 0930  TempSrc:   PainSc: 0-No pain                 Loyce Flaming

## 2021-04-16 NOTE — Discharge Instructions (Signed)
   [facility]  Discharge Instructions  AV Fistula or Graft Surgery for Dialysis Access  Please refer to the following instructions for your post-procedure care. Your surgeon or physician assistant will discuss any changes with you.  Activity  You may drive the day following your surgery, if you are comfortable and no longer taking prescription pain medication. Resume full activity as the soreness in your incision resolves.  Bathing/Showering  You may shower after you go home. Keep your incision dry for 48 hours. Do not soak in a bathtub, hot tub, or swim until the incision heals completely. You may not shower if you have a hemodialysis catheter.  Incision Care  Clean your incision with mild soap and water after 48 hours. Pat the area dry with a clean towel. You do not need a bandage unless otherwise instructed. Do not apply any ointments or creams to your incision. You may have skin glue on your incision. Do not peel it off. It will come off on its own in about one week. Your arm may swell a bit after surgery. To reduce swelling use pillows to elevate your arm so it is above your heart. Your doctor will tell you if you need to lightly wrap your arm with an ACE bandage.  Diet  Resume your normal diet. There are not special food restrictions following this procedure. In order to heal from your surgery, it is CRITICAL to get adequate nutrition. Your body requires vitamins, minerals, and protein. Vegetables are the best source of vitamins and minerals. Vegetables also provide the perfect balance of protein. Processed food has little nutritional value, so try to avoid this.  Medications  Resume taking all of your medications. If your incision is causing pain, you may take over-the counter pain relievers such as acetaminophen (Tylenol). If you were prescribed a stronger pain medication, please be aware these medications can cause nausea and constipation. Prevent  nausea by taking the medication with a snack or meal. Avoid constipation by drinking plenty of fluids and eating foods with high amount of fiber, such as fruits, vegetables, and grains.  Do not take Tylenol if you are taking prescription pain medications.  Follow up Your surgeon may want to see you in the office following your access surgery. If so, this will be arranged at the time of your surgery.  Please call us immediately for any of the following conditions:  Increased pain, redness, drainage (pus) from your incision site Fever of 101 degrees or higher Severe or worsening pain at your incision site Hand pain or numbness.  Reduce your risk of vascular disease:  Stop smoking. If you would like help, call [facility] at 1-800-QUIT-NOW 581 358 0654) or [place] at [place] your cholesterol Maintain a desired weight Control your diabetes Keep your blood pressure down  Dialysis  It will take several weeks to several months for your new dialysis access to be ready for use. Your surgeon will determine when it is okay to use it. Your nephrologist will continue to direct your dialysis. You can continue to use your Permcath until your new access is ready for use.   04/16/2021 Anna [name] [id number] 11-Jul-1946  Surgeon(s): Waynetta Sandy, MD  Procedure(s): RIGHT BRACHIOCEPHALIC ARTERIOVENOUS (AV) FISTULA CREATION  x Do not stick fistula for 12 weeks    If you have any questions, please call the office at [phone].

## 2021-04-16 NOTE — Transfer of Care (Signed)
Immediate Anesthesia Transfer of Care Note  Patient: Anna [name]  Procedure(s) Performed: RIGHT BRACHIOCEPHALIC ARTERIOVENOUS (AV) FISTULA CREATION (Right)  Patient Location: PACU  Anesthesia Type:MAC  Level of Consciousness: awake, alert , oriented and patient cooperative  Airway & Oxygen Therapy: Patient Spontanous Breathing  Post-op Assessment: Report given to RN, Post -op Vital signs reviewed and stable and Patient moving all extremities X 4  Post vital signs: Reviewed and stable  Last Vitals:  Vitals Value Taken Time  BP 157/85 04/16/21 0858  Temp    Pulse 81 04/16/21 0900  Resp 11 04/16/21 0900  SpO2 96 % 04/16/21 0900  Vitals shown include unvalidated device data.  Last Pain:  Vitals:   04/16/21 0624  TempSrc:   PainSc: 0-No pain         Complications: No notable events documented.

## 2021-04-16 NOTE — Interval H&P Note (Signed)
History and Physical Interval Note:  04/16/2021 7:28 AM  Anna [name]  has presented today for surgery, with the diagnosis of CHRONIC KIDNEY DISEASE STAGE IV.  The various methods of treatment have been discussed with the patient and family. After consideration of risks, benefits and other options for treatment, the patient has consented to  Procedure(s): RIGHT BRACHIOCEPHALIC ARTERIOVENOUS (AV) FISTULA CREATION (Right) as a surgical intervention.  The patient's history has been reviewed, patient examined, no change in status, stable for surgery.  I have reviewed the patient's chart and labs.  Questions were answered to the patient's satisfaction.     Servando Snare

## 2021-04-17 ENCOUNTER — Encounter (HOSPITAL_COMMUNITY): Payer: Self-pay | Admitting: Vascular Surgery

## 2021-06-04 ENCOUNTER — Telehealth: Payer: Self-pay | Admitting: Family Medicine

## 2021-06-04 NOTE — Telephone Encounter (Signed)
Looks like orders were sent in July for pt to "restart CPAP" I will have to contact the DME company because I am unable to determine when the 31-90 day time frame would be.

## 2021-06-04 NOTE — Telephone Encounter (Signed)
Pt was set up 04/03/2021 with a ibreeze machine. She would need apt 8/14-10/08/2021.

## 2021-06-04 NOTE — Telephone Encounter (Signed)
Pt scheduled for 06/10/21 at 9:30 with NP Megan. Informed pt to bring CPAP machine.

## 2021-06-04 NOTE — Telephone Encounter (Signed)
Pt called had to cancel her appt that was scheduled on 06/10/2021. Pt needs to be rescheduled did not see any appt's avail for her time frame. Will call pt back, need some appt suggestions.

## 2021-06-04 NOTE — Telephone Encounter (Signed)
Pt called wanting to schedule her initial CPAP follow up appt. However she doesn't remember when she received her machine she thinks it was May, if that is the case pt is out of her 30-91 day window. Pt requesting a  call back.

## 2021-06-05 NOTE — Telephone Encounter (Signed)
Called pt to get her rescheduled. If pt calls back please provide her with the times listed from [place].

## 2021-06-10 ENCOUNTER — Ambulatory Visit: Payer: Medicare Other | Admitting: Adult Health

## 2021-06-11 NOTE — Patient Instructions (Addendum)
Please continue using your CPAP regularly. While your insurance requires that you use CPAP at least 4 hours each night on 70% of the nights, I recommend, that you not skip any nights and use it throughout the night if you can. Getting used to CPAP and staying with the treatment long term does take time and patience and discipline. Untreated obstructive sleep apnea when it is moderate to severe can have an adverse impact on cardiovascular health and raise her risk for heart disease, arrhythmias, hypertension, congestive heart failure, stroke and diabetes. Untreated obstructive sleep apnea causes sleep disruption, nonrestorative sleep, and sleep deprivation. This can have an impact on your day to day functioning and cause daytime sleepiness and impairment of cognitive function, memory loss, mood disturbance, and problems focussing. Using CPAP regularly can improve these symptoms.  Follow up with me in 3 months    Management of Memory Problems   There are some general things you can do to help manage your memory problems.  Your memory may not in fact recover, but by using techniques and strategies you will be able to manage your memory difficulties better.   1)  Establish a routine. Try to establish and then stick to a regular routine.  By doing this, you will get used to what to expect and you will reduce the need to rely on your memory.  Also, try to do things at the same time of day, such as taking your medication or checking your calendar first thing in the morning. Think about think that you can do as a part of a regular routine and make a list.  Then enter them into a daily planner to remind you.  This will help you establish a routine.   2)  Organize your environment. Organize your environment so that it is uncluttered.  Decrease visual stimulation.  Place everyday items such as keys or cell phone in the same place every day (ie.  Basket next to front door) Use post it notes with a brief message  to yourself (ie. Turn off light, lock the door) Use labels to indicate where things go (ie. Which cupboards are for food, dishes, etc.) Keep a notepad and pen by the telephone to take messages   3)  Memory Aids A diary or journal/notebook/daily planner Making a list (shopping list, chore list, to do list that needs to be done) Using an alarm as a reminder (kitchen timer or cell phone alarm) Using cell phone to store information (Notes, Calendar, Reminders) Calendar/White board placed in a prominent position Post-it notes   In order for memory aids to be useful, you need to have good habits.  It's no good remembering to make a note in your journal if you don't remember to look in it.  Try setting aside a certain time of day to look in journal.   4)  Improving mood and managing fatigue. There may be other factors that contribute to memory difficulties.  Factors, such as anxiety, depression and tiredness can affect memory. Regular gentle exercise can help improve your mood and give you more energy. Simple relaxation techniques may help relieve symptoms of anxiety Try to get back to completing activities or hobbies you enjoyed doing in the past. Learn to pace yourself through activities to decrease fatigue. Find out about some local support groups where you can share experiences with others. Try and achieve 7-8 hours of sleep at night.

## 2021-06-11 NOTE — Progress Notes (Signed)
PATIENT: Anna [name] DOB: 06-Aug-1946  REASON FOR VISIT: follow up HISTORY FROM: patient  Chief Complaint  Patient presents with   Obstructive Sleep Apnea    Rm 13, w daughter. Here for initial CPAP f/u, pt reports doing well. Waking up feeling better.       HISTORY OF PRESENT ILLNESS: 06/12/21 ALL: Anna [name] returns for follow up. She was last seen 12/2020 and wished to restart CPAP therapy. She reports doing significantly better. She is resting much better. She denies difficulty tolerating CPAP. She reports that she is trying to use her machine every night. She admits that there are nights where she falls asleep before starting therapy. She also lost her 73yrold niece last month and did not use for almost 2 weeks.   Memory has significantly improved. She feels that she is doing very well. She is completely independent. She has never driven but able to manage finances and medications. She denies any other episodes of of AMS. Her youngest daughter is with her today and agrees that her memory seems much better.   Compliance report dated 05/12/2021-06/10/2021 shows that she used CPAP 17/30 night for compliance of 57%. She used CPAP greater than 4 hours 13/30 days for compliance of 43%. Average usage was 5.2 hours. Residual AHI was 1.8 on set pressure of 11cmH20.   01/10/2021 ALL:  She returns for follow up for memory loss and OSA on CPAP. Sleep study in 04/2019 confirmed severe OSA with AHI 50/hr, REM AHI 59.6/hr and O2 nadir of 50%. She was started on CPAP and memory loss felt to be related to sleep apnea and depression. Last follow up in 07/2019 showed memory stable and she was struggling to meet compliance with CPAP. She reports returning her CPAP machine at the request of the DME due to non compliance. Since, she has continued to have a difficult time sleeping. She wakes multiple times during the night. She sleeps a lot during the day. Her daughter and PCP continue to have concerns of  depression. She has recently learned that she has kidney failure. She was seen by ER for AMS due to hypoglycemia (47) on 12/16/20. She has been talking with her family and is concerned about her health. She has decided that she would like to resume CPAP therapy. She did note improvement in how she was sleeping and understands health risks of untreated sleep apnea. She was previous on 14cmH20 and using medium nasal mask.    08/11/2019 ALL:  Anna [name] a 75y.o. female here today for follow up of memory loss and OSA on CPAP.  She was initially seen by Dr. ARexene Albertsin June 2020.  Memory loss felt to be complicated by depression and poor sleep.  She was started on Lexapro which has significantly improved depressive symptoms.  Sleep study revealed severe sleep apnea.  She was started on CPAP therapy. She reports doing well on therapy. On nights that she uses CPAP, she can tell a significant difference in sleep quality. She feels that memory concerns have improved. She is using a nasal mask. She has not noted a leak.   Compliance report dated 07/11/2019 through 08/09/2019 reveals that she use CPAP 16 of the last 30 days for compliance of 53%.  6 days she used CPAP greater than 4 hours for compliance of 20%.  Average usage was 3 hours and 49 minutes.  Residual AHI was 0.9 on 14 cm of water and an EPR of 3.  There was a leak noted in the 95th percentile of 29.9  HISTORY: (copied from Dr Guadelupe Sabin note on 03/09/2019)  Dear Anna [name],    I saw your patient, Anna [name], upon your kind request in my neurologic clinic today for initial consultation of her memory loss.  The patient is unaccompanied today. She missed an appointment on 03/07/2019 secondary to GI illness.  As you know, [name] is a 75 year old right-handed woman with an underlying medical history of diabetes, hypertension, hyperlipidemia, glaucoma, reflux disease, arthritis, chronic kidney disease and obesity, who reports issues with  short-term memory in the past several months.  She would forget conversations or misplace items.  She reports that she was also quite depressed at the same time, tearful on having bouts of crying.  Her depression has significantly improved since she started Lexapro.  This was started in February.  She denies a family history of depression.  She had one half brother who was older and died about 69 years ago at age 74.  She has 4 daughters, 2 daughters have sleep apnea and uses CPAP machine.  She does not sleep well.  She endorses snoring and occasionally wakes up with a headache.  She has nocturia about once or twice per average night.  She goes to bed between 630 and 7 and typically watches TV until about 11.  Rise time varies, but mostly around 7.  She is busy, has close interaction with her family.  She has 7 grandchildren and 11 great grandchildren.  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  She has 1/10 grade education.  She quit smoking in 2019, she drinks alcohol occasionally, approximately 3 beers per weekend.  She is divorced and lives alone, but 2 of her daughters live in the same apartment building as her.  I reviewed your office note from 10/05/2018, as well as 11/05/2018.  She had lab work through your office on 08/16/2018 and I reviewed the results: Total cholesterol 206, triglycerides elevated at 343, LDL mildly elevated at 105, hepatitis screen was negative, folate normal, B12 1104, parathyroid hormone was 77 which is mildly elevated, vitamin D low at 10.  She had a brain MRI without contrast as well as MRA head without contrast on 11/21/2015 and I reviewed the results: IMPRESSION: MRI head: No acute or significant finding. Normal except for a few tiny white matter foci not of any clinical relevance. MRA head: Normal evaluation of the large and medium size vessels. No stenosis or occlusion. She has recently tried a sleep aid, she cannot recall the name of it.  Overall, in the past 2 or 3  months she feels improved with regards to her memory.  She feels that her depression is much improved as well.  She does not feel is forgetful.  She does not drive, has never driven. Her Epworth sleepiness score is 11 out of 24, fatigue severity score is 61 out of 63.   REVIEW OF SYSTEMS: Out of a complete 14 system review of symptoms, the patient complains only of the following symptoms, depression, fatigue, daytime sleepiness, numbness and all other reviewed systems are negative.  Epworth sleepiness scale: 5   ALLERGIES: Allergies  Allergen Reactions   Metformin Diarrhea and Other (See Comments)    GI upset     HOME MEDICATIONS: Outpatient Medications Prior to Visit  Medication Sig Dispense Refill   acetaminophen (TYLENOL) 500 MG tablet Take 500-1,000 mg by mouth every 6 (six) hours as needed for moderate pain  or mild pain.     amLODipine (NORVASC) 10 MG tablet Take 10 mg by mouth daily.     aspirin 81 MG tablet Take 1 tablet (81 mg total) by mouth daily. 30 tablet 11   atorvastatin (LIPITOR) 40 MG tablet Take 1 tablet (40 mg total) by mouth daily. 90 tablet 0   Blood Glucose Monitoring Suppl (ONE TOUCH ULTRA 2) w/Device KIT 1 Device by Does not apply route once. 1 each 0   escitalopram (LEXAPRO) 10 MG tablet Take 10 mg by mouth daily.     gabapentin (NEURONTIN) 100 MG capsule Take 100 mg by mouth daily.     glucose blood (ONE TOUCH ULTRA TEST) test strip Check sugar 3-4 x daily as needed 200 each 3   Insulin Glargine (LANTUS SOLOSTAR) 100 UNIT/ML Solostar Pen INJECT 60 UNITS SUBCUTANEOUSLY ONCE DAILY (Patient taking differently: Inject 25 Units into the skin daily before breakfast.) 15 mL 0   Insulin Pen Needle (B-D UF III MINI PEN NEEDLES) 31G X 5 MM MISC Use with Solostar Lantus pen needles 100 each 5   lubiprostone (AMITIZA) 24 MCG capsule Take 24 mcg by mouth 2 (two) times daily with a meal.     metoprolol succinate (TOPROL-XL) 50 MG 24 hr tablet Take 50 mg by mouth daily.      ONETOUCH DELICA LANCETS 20B MISC Test blood glucose 3-4 times daily as needed. ICD-10 code: E11.40. 100 each 5   quinapril (ACCUPRIL) 10 MG tablet Take 10 mg by mouth daily.     ROCKLATAN 0.02-0.005 % SOLN Place 1 drop into both eyes at bedtime.     Semaglutide,0.25 or 0.5MG/DOS, 2 MG/1.5ML SOPN Inject 0.5 mg into the skin once a week.     calcitRIOL (ROCALTROL) 0.25 MCG capsule Take 0.25 mcg by mouth every Monday.     HYDROcodone-acetaminophen (NORCO) 5-325 MG tablet Take 1 tablet by mouth every 6 (six) hours as needed for moderate pain. 8 tablet 0   No facility-administered medications prior to visit.    PAST MEDICAL HISTORY: Past Medical History:  Diagnosis Date   Arthritis    Blood transfusion without reported diagnosis    40 plus yrs ago   Cataract    CKD (chronic kidney disease), stage III ([place]) 08/10/2014   Diabetes mellitus    GERD (gastroesophageal reflux disease)    Glaucoma    Hyperlipidemia    Hypertension    Sleep apnea     PAST SURGICAL HISTORY: Past Surgical History:  Procedure Laterality Date   ABDOMINAL HYSTERECTOMY     AV FISTULA PLACEMENT Right 04/16/2021   Procedure: RIGHT BRACHIOCEPHALIC ARTERIOVENOUS (AV) FISTULA CREATION;  Surgeon: Waynetta Sandy, MD;  Location: [facility] OR;  Service: Vascular;  Laterality: Right;   COLONOSCOPY     POLYPECTOMY      FAMILY HISTORY: Family History  Problem Relation Age of Onset   Diabetes Daughter    Hyperlipidemia Mother    Diabetes Mother    Cancer Brother    Hyperlipidemia Brother    Colon cancer Neg Hx    Colon polyps Neg Hx    Rectal cancer Neg Hx    Stomach cancer Neg Hx    Esophageal cancer Neg Hx    Breast cancer Neg Hx     SOCIAL HISTORY: Social History   Socioeconomic History   Marital status: Divorced    Spouse name: Not on file   Number of children: 4   Years of education: 10   Highest education level:  Not on file  Occupational History   Occupation: Retired  Tobacco Use   Smoking  status: Former    Years: 30.00    Types: Cigarettes    Quit date: 10/09/2014    Years since quitting: 6.6   Smokeless tobacco: Never   Tobacco comments:    1 cig a week  Vaping Use   Vaping Use: Never used  Substance and Sexual Activity   Alcohol use: Not Currently    Alcohol/week: 1.0 standard drink    Types: 1 Cans of beer per week    Comment: occasional beer   Drug use: No   Sexual activity: Not on file  Other Topics Concern   Not on file  Social History Narrative   Health Care POA:    Emergency Contact: [place] Welcome (daughter) [phone]   End of Life Plan: Pt will bring copy of living will to next visit   Who lives with you: By herself   Any pets: 0   Diet: Pt reports recently reducing her intake of starches and carbohydrates. Pt reports eating more fresh fruits and vegetables to help reduce her A1C.     Exercise: Pt reports using the treadmill for 60 minutes, 2-3 times per week.    Seatbelts: Pt reports wearing a seatbelt when in vehicle.   Nancy Fetter Exposure/Protection: Pt denies wearing any protection but mentioned she will start due to side effects of her recently prescribed medication.    Hobbies: Reading and going to movies with family.   Social Determinants of Health   Financial Resource Strain: Not on file  Food Insecurity: Not on file  Transportation Needs: Not on file  Physical Activity: Not on file  Stress: Not on file  Social Connections: Not on file  Intimate Partner Violence: Not on file      PHYSICAL EXAM  Vitals:   06/12/21 1044  BP: 140/75  Pulse: 67  Weight: 170 lb (77.1 kg)  Height: _0  (1.575 m)    Body mass index is 31.09 kg/m.  Generalized: Well developed, in no acute distress  Cardiology: normal rate and rhythm, no murmur noted Respiratory: clear to auscultation bilaterally  Neurological examination  Mentation: Alert oriented to time, place, history taking. Follows all commands speech and language fluent Cranial nerve II-XII:  Pupils were equal round reactive to light. Extraocular movements were full, visual field were full on confrontational test.  Motor: The motor testing reveals 5 over 5 strength of all 4 extremities. Good symmetric motor tone is noted throughout.  Gait and station: Gait is normal.   DIAGNOSTIC DATA (LABS, IMAGING, TESTING) - I reviewed patient records, labs, notes, testing and imaging myself where available.  MMSE - Mini Mental State Exam 03/09/2019 03/08/2014  Orientation to time 5 4  Orientation to Place 4 4  Registration 3 3  Attention/ Calculation 4 5  Recall 3 3  Language- name 2 objects 2 2  Language- repeat 1 1  Language- follow 3 step command 3 3  Language- read & follow direction 1 1  Write a sentence 1 1  Copy design 1 0  Total score 28 27     Lab Results  Component Value Date   WBC 6.7 12/16/2020   HGB 10.9 (L) 04/16/2021   HCT 32.0 (L) 04/16/2021   MCV 91.3 12/16/2020   PLT 262 12/16/2020      Component Value Date/Time   NA 142 04/16/2021 0631   NA 143 03/31/2018 1121   K 3.9 04/16/2021 0631  CL 109 04/16/2021 0631   CO2 25 12/16/2020 1023   GLUCOSE 106 (H) 04/16/2021 0631   BUN 24 (H) 04/16/2021 0631   BUN 23 03/31/2018 1121   CREATININE 2.50 (H) 04/16/2021 0631   CREATININE 1.02 (H) 06/06/2015 1452   CALCIUM 8.7 (L) 12/16/2020 1023   PROT 5.9 (L) 12/16/2020 1023   ALBUMIN 2.8 (L) 12/16/2020 1023   AST 25 12/16/2020 1023   ALT 21 12/16/2020 1023   ALKPHOS 82 12/16/2020 1023   BILITOT 0.3 12/16/2020 1023   GFRNONAA 22 (L) 12/16/2020 1023   GFRNONAA 42 (L) 06/12/2014 1150   GFRAA 47 (L) 03/31/2018 1121   GFRAA 49 (L) 06/12/2014 1150   Lab Results  Component Value Date   CHOL 183 11/21/2015   HDL 46 11/21/2015   LDLCALC 64 11/21/2015   LDLDIRECT 109 (H) 06/12/2014   TRIG 365 (H) 11/21/2015   CHOLHDL 4.0 11/21/2015   Lab Results  Component Value Date   HGBA1C 10.8 (A) 03/31/2018   No results found for: [id number] Lab Results  Component Value  Date   TSH 1.972 11/20/2015    ASSESSMENT AND PLAN 75 y.o. year old female  has a past medical history of Arthritis, Blood transfusion without reported diagnosis, Cataract, CKD (chronic kidney disease), stage III ([place]) (08/10/2014), Diabetes mellitus, GERD (gastroesophageal reflux disease), Glaucoma, Hyperlipidemia, Hypertension, and Sleep apnea. here with     ICD-10-CM   1. OSA on CPAP  G47.33    Z99.89     2. Memory loss  R41.3         Regena is doing well on CPAP therapy. She reports that she is sleeping better and memory has improved. Compliance report shows sub optimal usage. We have discussed need for compliance and risks of untreated sleep apnea. She was encouraged to continue using CPAP nightly and for greater than 4 hours each night.  She will follow-up in 3 months, sooner if needed.  She verbalizes understanding and agreement with this plan.   No orders of the defined types were placed in this encounter.    No orders of the defined types were placed in this encounter.     Debbora Presto, FNP-C 06/12/2021, 12:02 PM [facility] [address] [place], [place] [address] [phone]

## 2021-06-12 ENCOUNTER — Ambulatory Visit (INDEPENDENT_AMBULATORY_CARE_PROVIDER_SITE_OTHER): Payer: Medicare Other | Admitting: Family Medicine

## 2021-06-12 ENCOUNTER — Encounter: Payer: Self-pay | Admitting: Family Medicine

## 2021-06-12 VITALS — BP 140/75 | HR 67 | Ht 62.0 in | Wt 170.0 lb

## 2021-06-12 DIAGNOSIS — R413 Other amnesia: Secondary | ICD-10-CM

## 2021-06-12 DIAGNOSIS — G4733 Obstructive sleep apnea (adult) (pediatric): Secondary | ICD-10-CM | POA: Diagnosis not present

## 2021-06-12 DIAGNOSIS — Z9989 Dependence on other enabling machines and devices: Secondary | ICD-10-CM

## 2021-06-19 ENCOUNTER — Ambulatory Visit
Admission: RE | Admit: 2021-06-19 | Discharge: 2021-06-19 | Disposition: A | Discharge status: Home / Self Care | Payer: Medicare Other | Source: Ambulatory Visit | Attending: Family | Admitting: Family

## 2021-06-19 ENCOUNTER — Other Ambulatory Visit: Payer: Self-pay | Admitting: Family

## 2021-06-19 DIAGNOSIS — M8949 Other hypertrophic osteoarthropathy, multiple sites: Secondary | ICD-10-CM

## 2021-07-02 ENCOUNTER — Encounter: Payer: Self-pay | Admitting: Gastroenterology

## 2021-07-09 ENCOUNTER — Encounter: Payer: Self-pay | Admitting: Gastroenterology

## 2021-07-19 ENCOUNTER — Encounter: Payer: Self-pay | Admitting: Gastroenterology

## 2021-07-19 ENCOUNTER — Ambulatory Visit (INDEPENDENT_AMBULATORY_CARE_PROVIDER_SITE_OTHER): Payer: Medicare Other | Admitting: Gastroenterology

## 2021-07-19 VITALS — BP 126/64 | HR 66 | Ht 62.0 in | Wt 171.0 lb

## 2021-07-19 DIAGNOSIS — Z86010 Personal history of colonic polyps: Secondary | ICD-10-CM

## 2021-07-19 MED ORDER — NA SULFATE-K SULFATE-MG SULF 17.5-3.13-1.6 GM/177ML PO SOLN: 1.0000 | ORAL | 0 refills | Status: DC

## 2021-07-19 MED ORDER — CITRUCEL PO POWD: 1.0000 | Freq: Every day | ORAL | Status: AC

## 2021-07-19 NOTE — Progress Notes (Signed)
Review of pertinent gastrointestinal problems: 1. History of precancerous colon polyps. Colonoscopy 2009, single sessile serrated adenoma was removed. The colonoscopy November 2015 found 3 polyps, one was a tubular adenoma. She was recommended to have repeat surveillance colonoscopy at 7 year interval. 2.  Oropharyngeal dysphagia.  Evaluation March 2018 Dr. Ardis Hughs, unusual sensation of swelling in her throat and she cannot swallow, the food "just will exit her mouth".  Speech therapy modified barium swallow evaluation "minimal oral deficits characterized by premature spillage of liquids/solids into pharynx and difficulty transitioning tablet with liquids on first attempt.  Patient required neck extension to 8 oral transitioning with second bolus of liquid.  No aspiration/penetration.  HPI: This is a very pleasant 75 year old woman  She was last here in our office about 4-1/2 years ago.  At that time we discussed her unusual oropharyngeal type dysphagia.  I referred her to speech therapist.  See that all above.  We have not heard from her since then.  Today she is here with constipation troubles and personal history of adenomatous polyps.  Her med list states she is taking Amitiza 24 mcg 1 pill twice daily.  She is not sure if she is actually taking it, her daughter says that all of her pills are in blister packs now and she is pretty certain her mom is indeed taking it.  Regardless she has a bowel movement only once every few days and sometimes only once a week with straining.  She will often have to take milk of magnesia to help move her bowels.  She has never tried fiber supplements or MiraLAX for her bowel  She has no abdominal discomforts, she has no overt GI bleeding.  Colon cancer does not run in her family  Review of systems: Pertinent positive and negative review of systems were noted in the above HPI section. All other review negative.   Past Medical History:  Diagnosis Date   Arthritis     Blood transfusion without reported diagnosis    40 plus yrs ago   Cataract    CKD (chronic kidney disease), stage III ([place]) 08/10/2014   Diabetes mellitus    GERD (gastroesophageal reflux disease)    Glaucoma    Hyperlipidemia    Hypertension    Sleep apnea     Past Surgical History:  Procedure Laterality Date   ABDOMINAL HYSTERECTOMY     AV FISTULA PLACEMENT Right 04/16/2021   Procedure: RIGHT BRACHIOCEPHALIC ARTERIOVENOUS (AV) FISTULA CREATION;  Surgeon: Waynetta Sandy, MD;  Location: [facility];  Service: Vascular;  Laterality: Right;   CATARACT EXTRACTION, BILATERAL     COLONOSCOPY     POLYPECTOMY      Current Outpatient Medications  Medication Sig Dispense Refill   acetaminophen (TYLENOL) 500 MG tablet Take 500-1,000 mg by mouth every 6 (six) hours as needed for moderate pain or mild pain.     amLODipine (NORVASC) 10 MG tablet Take 10 mg by mouth daily.     aspirin 81 MG tablet Take 1 tablet (81 mg total) by mouth daily. 30 tablet 11   atorvastatin (LIPITOR) 40 MG tablet Take 1 tablet (40 mg total) by mouth daily. 90 tablet 0   Blood Glucose Monitoring Suppl (ONE TOUCH ULTRA 2) w/Device KIT 1 Device by Does not apply route once. 1 each 0   calcitRIOL (ROCALTROL) 0.25 MCG capsule Take 0.25 mcg by mouth 3 (three) times a week.     escitalopram (LEXAPRO) 10 MG tablet Take 10 mg by mouth  daily.     gabapentin (NEURONTIN) 100 MG capsule Take 100 mg by mouth 3 (three) times daily.     glucose blood (ONE TOUCH ULTRA TEST) test strip Check sugar 3-4 x daily as needed 200 each 3   Insulin Glargine (LANTUS SOLOSTAR) 100 UNIT/ML Solostar Pen INJECT 60 UNITS SUBCUTANEOUSLY ONCE DAILY (Patient taking differently: Inject 25 Units into the skin daily before breakfast.) 15 mL 0   Insulin Pen Needle (B-D UF III MINI PEN NEEDLES) 31G X 5 MM MISC Use with Solostar Lantus pen needles 100 each 5   lubiprostone (AMITIZA) 24 MCG capsule Take 24 mcg by mouth 2 (two) times daily with a meal.      metoprolol tartrate (LOPRESSOR) 25 MG tablet Take 12.5 mg by mouth 2 (two) times daily.     ONETOUCH DELICA LANCETS 83J MISC Test blood glucose 3-4 times daily as needed. ICD-10 code: E11.40. 100 each 5   quinapril (ACCUPRIL) 20 MG tablet Take 20 mg by mouth daily.     ROCKLATAN 0.02-0.005 % SOLN Place 1 drop into both eyes at bedtime.     Semaglutide,0.25 or 0.5MG/DOS, 2 MG/1.5ML SOPN Inject 0.5 mg into the skin once a week.     No current facility-administered medications for this visit.    Allergies as of 07/19/2021 - Review Complete 07/19/2021  Allergen Reaction Noted   Metformin Diarrhea and Other (See Comments) 04/22/2010    Family History  Problem Relation Age of Onset   Heart disease Mother    Hyperlipidemia Mother    Diabetes Mother    Cancer Brother    Hyperlipidemia Brother    Diabetes Daughter    Diabetes Daughter    Colon cancer Neg Hx    Colon polyps Neg Hx    Rectal cancer Neg Hx    Stomach cancer Neg Hx    Esophageal cancer Neg Hx    Breast cancer Neg Hx     Social History   Socioeconomic History   Marital status: Divorced    Spouse name: Not on file   Number of children: 4   Years of education: 10   Highest education level: Not on file  Occupational History   Occupation: Retired  Tobacco Use   Smoking status: Former    Years: 30.00    Types: Cigarettes    Quit date: 10/09/2014    Years since quitting: 6.7   Smokeless tobacco: Never   Tobacco comments:    1 cig a week  Vaping Use   Vaping Use: Never used  Substance and Sexual Activity   Alcohol use: Not Currently    Alcohol/week: 1.0 standard drink    Types: 1 Cans of beer per week    Comment: occasional beer   Drug use: No   Sexual activity: Not on file  Other Topics Concern   Not on file  Social History Narrative   Health Care POA:    Emergency Contact: Yvaine Jankowiak (daughter) [phone]   End of Life Plan: Pt will bring copy of living will to next visit   Who lives with you: By  herself   Any pets: 0   Diet: Pt reports recently reducing her intake of starches and carbohydrates. Pt reports eating more fresh fruits and vegetables to help reduce her A1C.     Exercise: Pt reports using the treadmill for 60 minutes, 2-3 times per week.    Seatbelts: Pt reports wearing a seatbelt when in vehicle.   Nancy Fetter Exposure/Protection: Pt denies  wearing any protection but mentioned she will start due to side effects of her recently prescribed medication.    Hobbies: Reading and going to movies with family.   Social Determinants of Health   Financial Resource Strain: Not on file  Food Insecurity: Not on file  Transportation Needs: Not on file  Physical Activity: Not on file  Stress: Not on file  Social Connections: Not on file  Intimate Partner Violence: Not on file     Physical Exam: BP 126/64   Pulse 66   Ht '5\' 2"'  (1.575 m)   Wt 171 lb (77.6 kg)   SpO2 98%   BMI 31.28 kg/m  Constitutional: generally well-appearing Psychiatric: alert and oriented x3 Eyes: extraocular movements intact Mouth: oral pharynx moist, no lesions Neck: supple no lymphadenopathy Cardiovascular: heart regular rate and rhythm Lungs: clear to auscultation bilaterally Abdomen: soft, nontender, nondistended, no obvious ascites, no peritoneal signs, normal bowel sounds Extremities: no lower extremity edema bilaterally Skin: no lesions on visible extremities   Assessment and plan: 75 y.o. female with personal history of precancerous colon polyps, chronic constipation  I recommended she completely stop the Amitiza because it does not seem to be working for her very well.  Instead I asked that she start taking a trial of fiber supplement Citrucel on a once daily basis.  This is usually very effective, first-line therapy for chronic constipation such as hers.  She understands that if this does not work that there are many other options which I will help her with.  She had 2 adenomatous polyps removed  2015 and by current guidelines she needs a surveillance colonoscopy around now and we will arrange that to be done at her soonest convenience as well.  I see no reason for any further blood tests or imaging studies prior to then.  Please see the "Patient Instructions" section for addition details about the plan.   Owens Loffler, MD [facility] 07/19/2021, 10:08 AM  Cc: Sonia Side., FNP  Total time on date of encounter was 40  minutes (this included time spent preparing to see the patient reviewing records; obtaining and/or reviewing separately obtained history; performing a medically appropriate exam and/or evaluation; counseling and educating the patient and family if present; ordering medications, tests or procedures if applicable; and documenting clinical information in the health record).

## 2021-07-19 NOTE — Patient Instructions (Signed)
If you are age 73 or older, your body mass index should be between 23-30. Your Body mass index is 31.28 kg/m. If this is out of the aforementioned range listed, please consider follow up with your Primary Care Provider. ________________________________________________________  The [facility] providers would like to encourage you to use [facility] to communicate with providers for non-urgent requests or questions.  Due to long hold times on the telephone, sending your provider a message by [facility] may be a faster and more efficient way to get a response.  Please allow 48 business hours for a response.  Please remember that this is for non-urgent requests.  _______________________________________________________  Anna [name] have been scheduled for a colonoscopy. Please follow written instructions given to you at your visit today.  Please pick up your prep supplies at the pharmacy within the next 1-3 days. If you use inhalers (even only as needed), please bring them with you on the day of your procedure.  Due to recent changes in healthcare laws, you may see the results of your imaging and laboratory studies on MyChart before your provider has had a chance to review them.  We understand that in some cases there may be results that are confusing or concerning to you. Not all laboratory results come back in the same time frame and the provider may be waiting for multiple results in order to interpret others.  Please give [name] 48 hours in order for your provider to thoroughly review all the results before contacting the office for clarification of your results.   DISCONTINUE: Amitiza  Please start taking citrucel (orange flavored) powder fiber supplement.  This may cause some bloating at first but that usually goes away. Begin with a small spoonful and work your way up to a large, heaping spoonful daily over a week.  Thank you for entrusting me with your care and choosing [facility].  Dr Ardis Hughs

## 2021-08-30 ENCOUNTER — Telehealth: Payer: Self-pay

## 2021-08-30 ENCOUNTER — Other Ambulatory Visit: Payer: Self-pay

## 2021-08-30 DIAGNOSIS — N184 Chronic kidney disease, stage 4 (severe): Secondary | ICD-10-CM

## 2021-08-30 NOTE — Telephone Encounter (Signed)
Patient's daughter calls today to report that since the creation of AVF in July 2022 - patient's numbness and tingling in that extremity has gotten worse. Patient never had follow up appt and has not yet started HD. Discussed with MD, added patient on for steal [name] and eval.

## 2021-09-11 ENCOUNTER — Ambulatory Visit (INDEPENDENT_AMBULATORY_CARE_PROVIDER_SITE_OTHER): Payer: Medicare Other | Admitting: Physician Assistant

## 2021-09-11 ENCOUNTER — Other Ambulatory Visit: Payer: Self-pay

## 2021-09-11 ENCOUNTER — Ambulatory Visit (HOSPITAL_COMMUNITY)
Admission: RE | Admit: 2021-09-11 | Discharge: 2021-09-11 | Disposition: A | Discharge status: Home / Self Care | Payer: Medicare Other | Source: Ambulatory Visit | Attending: Vascular Surgery | Admitting: Vascular Surgery

## 2021-09-11 VITALS — BP 122/69 | HR 68 | Temp 97.3°F | Resp 20 | Ht 62.0 in | Wt 162.7 lb

## 2021-09-11 DIAGNOSIS — N184 Chronic kidney disease, stage 4 (severe): Secondary | ICD-10-CM | POA: Insufficient documentation

## 2021-09-11 NOTE — Progress Notes (Signed)
Office Note     CC:  follow up Requesting Provider:  Sonia Side., FNP  HPI: Anna [name] is a 75 y.o. ([birth year]) female who presents for evaluation of right AV fistula for possible steal.  She underwent right brachiocephalic fistula creation by Dr. Donzetta Matters in July of this year.  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  She states over the past several weeks the numbness and tingling of the right hand has gotten worse.  Symptoms are tolerable and she is able to use her hand.  She is not yet on hemodialysis.  CKD is managed by Dr. Royce Macadamia who she follows up with every 3 months.  She also states she has met with the [facility] transplant service.  Past Medical History:  Diagnosis Date   Arthritis    Blood transfusion without reported diagnosis    40 plus yrs ago   Cataract    CKD (chronic kidney disease), stage III ([place]) 08/10/2014   Diabetes mellitus    GERD (gastroesophageal reflux disease)    Glaucoma    Hyperlipidemia    Hypertension    Sleep apnea     Past Surgical History:  Procedure Laterality Date   ABDOMINAL HYSTERECTOMY     AV FISTULA PLACEMENT Right 04/16/2021   Procedure: RIGHT BRACHIOCEPHALIC ARTERIOVENOUS (AV) FISTULA CREATION;  Surgeon: Waynetta Sandy, MD;  Location: [facility];  Service: Vascular;  Laterality: Right;   CATARACT EXTRACTION, BILATERAL     COLONOSCOPY     POLYPECTOMY      Social History   Socioeconomic History   Marital status: Divorced    Spouse name: Not on file   Number of children: 4   Years of education: 10   Highest education level: Not on file  Occupational History   Occupation: Retired  Tobacco Use   Smoking status: Former    Years: 30.00    Types: Cigarettes    Quit date: 10/09/2014    Years since quitting: 6.9   Smokeless tobacco: Never   Tobacco comments:    1 cig a week  Vaping Use   Vaping Use:  Never used  Substance and Sexual Activity   Alcohol use: Not Currently    Alcohol/week: 1.0 standard drink    Types: 1 Cans of beer per week    Comment: occasional beer   Drug use: No   Sexual activity: Not on file  Other Topics Concern   Not on file  Social History Narrative   Health Care POA:    Emergency Contact: Breann Losano (daughter) [phone]   End of Life Plan: Pt will bring copy of living will to next visit   Who lives with you: By herself   Any pets: 0   Diet: Pt reports recently reducing her intake of starches and carbohydrates. Pt reports eating more fresh fruits and vegetables to help reduce her A1C.     Exercise: Pt reports using the treadmill for 60 minutes, 2-3 times per week.    Seatbelts: Pt reports wearing a seatbelt when in vehicle.   Nancy Fetter Exposure/Protection: Pt denies wearing any protection but mentioned she will start due to side effects of her recently prescribed medication.    Hobbies: Reading and going to movies with family.   Social Determinants of Health   Financial Resource Strain: Not on file  Food Insecurity: Not on file  Transportation Needs: Not on file  Physical Activity: Not on file  Stress: Not on file  Social Connections: Not on file  Intimate Partner Violence: Not on file    Family History  Problem Relation Age of Onset   Heart disease Mother    Hyperlipidemia Mother    Diabetes Mother    Cancer Brother    Hyperlipidemia Brother    Diabetes Daughter    Diabetes Daughter    Colon cancer Neg Hx    Colon polyps Neg Hx    Rectal cancer Neg Hx    Stomach cancer Neg Hx    Esophageal cancer Neg Hx    Breast cancer Neg Hx     Current Outpatient Medications  Medication Sig Dispense Refill   acetaminophen (TYLENOL) 500 MG tablet Take 500-1,000 mg by mouth every 6 (six) hours as needed for moderate pain or mild pain.     amLODipine (NORVASC) 10 MG tablet Take 10 mg by mouth daily.     aspirin 81 MG tablet Take 1 tablet (81 mg total)  by mouth daily. 30 tablet 11   atorvastatin (LIPITOR) 40 MG tablet Take 1 tablet (40 mg total) by mouth daily. 90 tablet 0   Blood Glucose Monitoring Suppl (ONE TOUCH ULTRA 2) w/Device KIT 1 Device by Does not apply route once. 1 each 0   calcitRIOL (ROCALTROL) 0.25 MCG capsule Take 0.25 mcg by mouth 3 (three) times a week.     escitalopram (LEXAPRO) 10 MG tablet Take 10 mg by mouth daily.     gabapentin (NEURONTIN) 100 MG capsule Take 100 mg by mouth 3 (three) times daily.     glucose blood (ONE TOUCH ULTRA TEST) test strip Check sugar 3-4 x daily as needed 200 each 3   Insulin Glargine (LANTUS SOLOSTAR) 100 UNIT/ML Solostar Pen INJECT 60 UNITS SUBCUTANEOUSLY ONCE DAILY (Patient taking differently: Inject 25 Units into the skin daily before breakfast.) 15 mL 0   Insulin Pen Needle (B-D UF III MINI PEN NEEDLES) 31G X 5 MM MISC Use with Solostar Lantus pen needles 100 each 5   methylcellulose (CITRUCEL) oral powder Take 1 packet by mouth daily.     metoprolol tartrate (LOPRESSOR) 25 MG tablet Take 12.5 mg by mouth 2 (two) times daily.     Na Sulfate-K Sulfate-Mg Sulf (SUPREP BOWEL PREP KIT) 17.5-3.13-1.6 GM/177ML SOLN Take 1 kit by mouth as directed. 324 mL 0   ONETOUCH DELICA LANCETS 67M MISC Test blood glucose 3-4 times daily as needed. ICD-10 code: E11.40. 100 each 5   ROCKLATAN 0.02-0.005 % SOLN Place 1 drop into both eyes at bedtime.     Semaglutide,0.25 or 0.5MG/DOS, 2 MG/1.5ML SOPN Inject 0.5 mg into the skin once a week.     No current facility-administered medications for this visit.    Allergies  Allergen Reactions   Metformin Diarrhea and Other (See Comments)    GI upset      REVIEW OF SYSTEMS:   '[X]'  denotes positive finding, '[ ]'  denotes negative finding Cardiac  Comments:  Chest pain or chest pressure:    Shortness of breath upon exertion:    Short of breath when lying flat:    Irregular heart rhythm:        Vascular    Pain in calf, thigh, or hip brought on by  ambulation:    Pain in feet at night that wakes you up from your sleep:     Blood clot in your veins:    Leg swelling:  Pulmonary    Oxygen at home:    Productive cough:     Wheezing:         Neurologic    Sudden weakness in arms or legs:     Sudden numbness in arms or legs:     Sudden onset of difficulty speaking or slurred speech:    Temporary loss of vision in one eye:     Problems with dizziness:         Gastrointestinal    Blood in stool:     Vomited blood:         Genitourinary    Burning when urinating:     Blood in urine:        Psychiatric    Major depression:         Hematologic    Bleeding problems:    Problems with blood clotting too easily:        Skin    Rashes or ulcers:        Constitutional    Fever or chills:      PHYSICAL EXAMINATION:  Vitals:   09/11/21 1511  BP: 122/69  Pulse: 68  Resp: 20  Temp: (!) 97.3 F (36.3 C)  TempSrc: Temporal  SpO2: 100%  Weight: 162 lb 11.2 oz (73.8 kg)  Height: '5\' 2"'  (1.575 m)    General:  WDWN in NAD; vital signs documented above Gait: Not observed HENT: WNL, normocephalic Pulmonary: normal non-labored breathing  Cardiac: regular HR Abdomen: soft, NT, no masses Skin: without rashes Vascular Exam/Pulses:  Right Left  Radial 2+ (normal) 2+ (normal)   Extremities: Symmetrical radial pulses; somewhat torturous cephalic vein however palpable thrill throughout fistula Musculoskeletal: no muscle wasting or atrophy  Neurologic: A&O X 3;  No focal weakness or paresthesias are detected Psychiatric:  The pt has normal affect.   Non-Invasive Vascular Imaging:   No significant change in digit pressure with compression of fistula    ASSESSMENT/PLAN:: 75 y.o. female here for evaluation of right arm AV fistula for possible steal  -Steal study does not show a significant change in digit pressure when fistula compressed -Right hand seems well perfused with palpable radial pulse -Encouraged patient  to exercise her hand and use her right arm in her activities of daily living -No indication for ligation of right arm AV fistula -Fistula somewhat tortuous however is adequate depth and diameter.  Okay to cannulate if HD is initiated -Patient will follow up on an as-needed basis   Dagoberto Ligas, PA-C [facility] [phone]  Clinic MD:   Donzetta Matters

## 2021-09-13 ENCOUNTER — Other Ambulatory Visit: Payer: Self-pay

## 2021-09-13 ENCOUNTER — Ambulatory Visit (INDEPENDENT_AMBULATORY_CARE_PROVIDER_SITE_OTHER): Payer: Medicare Other | Admitting: Podiatry

## 2021-09-13 ENCOUNTER — Encounter: Payer: Medicare Other | Admitting: Gastroenterology

## 2021-09-13 DIAGNOSIS — Q828 Other specified congenital malformations of skin: Secondary | ICD-10-CM | POA: Diagnosis not present

## 2021-09-17 ENCOUNTER — Telehealth: Payer: Self-pay | Admitting: Podiatry

## 2021-09-17 NOTE — Telephone Encounter (Signed)
Pt called stating her pharmacy never received an order for her cream. She is requesting it to be sent to [facility] on [address]. Please advise.

## 2021-09-17 NOTE — Progress Notes (Signed)
Subjective:  Patient ID: Anna [name], female    DOB: September 05, 1946,  MRN: [id number]  Chief Complaint  Patient presents with   Callouses    Bilateral callus     75 y.o. female presents with the above complaint.  Patient presents with bilateral heel benign skin lesion.  Patient states that it is painful to touch.  She is a diabetic with last A1c of 10.8%.  She states that there is no pain associated with it but she wanted get it evaluated.  She has not seen me for this in the past.  She denies any other acute complaints.  She would like to discuss treatment options.   Review of Systems: Negative except as noted in the HPI. Denies N/V/F/Ch.  Past Medical History:  Diagnosis Date   Arthritis    Blood transfusion without reported diagnosis    40 plus yrs ago   Cataract    CKD (chronic kidney disease), stage III ([place]) 08/10/2014   Diabetes mellitus    GERD (gastroesophageal reflux disease)    Glaucoma    Hyperlipidemia    Hypertension    Sleep apnea     Current Outpatient Medications:    acetaminophen (TYLENOL) 500 MG tablet, Take 500-1,000 mg by mouth every 6 (six) hours as needed for moderate pain or mild pain., Disp: , Rfl:    amLODipine (NORVASC) 10 MG tablet, Take 10 mg by mouth daily., Disp: , Rfl:    aspirin 81 MG tablet, Take 1 tablet (81 mg total) by mouth daily., Disp: 30 tablet, Rfl: 11   atorvastatin (LIPITOR) 40 MG tablet, Take 1 tablet (40 mg total) by mouth daily., Disp: 90 tablet, Rfl: 0   Blood Glucose Monitoring Suppl (ONE TOUCH ULTRA 2) w/Device KIT, 1 Device by Does not apply route once., Disp: 1 each, Rfl: 0   calcitRIOL (ROCALTROL) 0.25 MCG capsule, Take 0.25 mcg by mouth 3 (three) times a week., Disp: , Rfl:    escitalopram (LEXAPRO) 10 MG tablet, Take 10 mg by mouth daily., Disp: , Rfl:    gabapentin (NEURONTIN) 100 MG capsule, Take 100 mg by mouth 3 (three) times daily., Disp: , Rfl:    glucose blood (ONE TOUCH ULTRA TEST) test strip, Check sugar 3-4  x daily as needed, Disp: 200 each, Rfl: 3   Insulin Glargine (LANTUS SOLOSTAR) 100 UNIT/ML Solostar Pen, INJECT 60 UNITS SUBCUTANEOUSLY ONCE DAILY (Patient taking differently: Inject 25 Units into the skin daily before breakfast.), Disp: 15 mL, Rfl: 0   Insulin Pen Needle (B-D UF III MINI PEN NEEDLES) 31G X 5 MM MISC, Use with Solostar Lantus pen needles, Disp: 100 each, Rfl: 5   methylcellulose (CITRUCEL) oral powder, Take 1 packet by mouth daily., Disp: , Rfl:    metoprolol tartrate (LOPRESSOR) 25 MG tablet, Take 12.5 mg by mouth 2 (two) times daily., Disp: , Rfl:    Na Sulfate-K Sulfate-Mg Sulf (SUPREP BOWEL PREP KIT) 17.5-3.13-1.6 GM/177ML SOLN, Take 1 kit by mouth as directed., Disp: 324 mL, Rfl: 0   ONETOUCH DELICA LANCETS 10C MISC, Test blood glucose 3-4 times daily as needed. ICD-10 code: E11.40., Disp: 100 each, Rfl: 5   ROCKLATAN 0.02-0.005 % SOLN, Place 1 drop into both eyes at bedtime., Disp: , Rfl:    Semaglutide,0.25 or 0.5MG/DOS, 2 MG/1.5ML SOPN, Inject 0.5 mg into the skin once a week., Disp: , Rfl:   Social History   Tobacco Use  Smoking Status Former   Years: 30.00   Types: Cigarettes  Quit date: 10/09/2014   Years since quitting: 6.9  Smokeless Tobacco Never  Tobacco Comments   1 cig a week    Allergies  Allergen Reactions   Metformin Diarrhea and Other (See Comments)    GI upset    Objective:  There were no vitals filed for this visit. There is no height or weight on file to calculate BMI. Constitutional Well developed. Well nourished.  Vascular Dorsalis pedis pulses palpable bilaterally. Posterior tibial pulses palpable bilaterally. Capillary refill normal to all digits.  No cyanosis or clubbing noted. Pedal hair growth normal.  Neurologic Normal speech. Oriented to person, place, and time. Epicritic sensation to light touch grossly present bilaterally.  Dermatologic Hyperkeratotic lesion with central nucleated core noted.  Mild pain on palpation of both  of the lesion sites.  This is bilateral heel multiple sites  Orthopedic: Normal joint ROM without pain or crepitus bilaterally. No visible deformities. No bony tenderness.   Radiographs: None Assessment:   1. Porokeratosis    Plan:  Patient was evaluated and treated and all questions answered.  Bilateral heel porokeratosis/benign skin lesion -I explained to the patient the etiology of skin lesion and various treatment options were extensively discussed.  Given that she is having mild pain I believe she will benefit from debridement of the lesion followed by excision of central nucleated core.  Patient agrees with plan would like to proceed with that.  Using chisel blade and handle the lesion was debrided down to healthy striated tissue.  No complication noted.  No pinpoint bleeding noted.  The central nucleated core was excised.  No follow-ups on file.

## 2021-09-18 ENCOUNTER — Other Ambulatory Visit: Payer: Self-pay | Admitting: Podiatry

## 2021-09-18 MED ORDER — AMMONIUM LACTATE 12 % EX LOTN: 1.0000 "application " | TOPICAL_LOTION | CUTANEOUS | 0 refills | Status: AC | PRN

## 2021-10-09 ENCOUNTER — Ambulatory Visit: Payer: Medicare Other | Admitting: Family Medicine

## 2021-10-24 ENCOUNTER — Encounter: Payer: Self-pay | Admitting: Certified Registered Nurse Anesthetist

## 2021-10-25 ENCOUNTER — Encounter: Payer: Self-pay | Admitting: Gastroenterology

## 2021-10-25 ENCOUNTER — Ambulatory Visit (AMBULATORY_SURGERY_CENTER): Payer: Medicare Other | Admitting: Gastroenterology

## 2021-10-25 ENCOUNTER — Other Ambulatory Visit: Payer: Self-pay

## 2021-10-25 VITALS — BP 188/80 | HR 69 | Temp 98.8°F | Resp 12 | Ht 62.0 in | Wt 162.0 lb

## 2021-10-25 DIAGNOSIS — Z86010 Personal history of colonic polyps: Secondary | ICD-10-CM

## 2021-10-25 MED ORDER — SODIUM CHLORIDE 0.9 % IV SOLN: 500.0000 mL | Freq: Once | INTRAVENOUS | Status: DC

## 2021-10-25 NOTE — Progress Notes (Signed)
0835 BP 176/74, Labetalol given IV, MD update, vss

## 2021-10-25 NOTE — Progress Notes (Signed)
HPI: This is a woman with h/o polpys  Colonoscopy 2009, single sessile serrated adenoma was removed. The colonoscopy November 2015 found 3 polyps, one was a tubular adenoma. She was recommended to have repeat surveillance colonoscopy at 7 year interval.    ROS: complete GI ROS as described in HPI, all other review negative.  Constitutional:  No unintentional weight loss   Past Medical History:  Diagnosis Date   Arthritis    Blood transfusion without reported diagnosis    40 plus yrs ago   Cataract    CKD (chronic kidney disease), stage III ([place]) 08/10/2014   Depression    Diabetes mellitus    GERD (gastroesophageal reflux disease)    Glaucoma    Heart murmur    Hyperlipidemia    Hypertension    Sleep apnea     Past Surgical History:  Procedure Laterality Date   ABDOMINAL HYSTERECTOMY     AV FISTULA PLACEMENT Right 04/16/2021   Procedure: RIGHT BRACHIOCEPHALIC ARTERIOVENOUS (AV) FISTULA CREATION;  Surgeon: Waynetta Sandy, MD;  Location: [facility];  Service: Vascular;  Laterality: Right;   CATARACT EXTRACTION, BILATERAL     COLONOSCOPY     POLYPECTOMY      Current Outpatient Medications  Medication Sig Dispense Refill   amLODipine (NORVASC) 10 MG tablet Take 10 mg by mouth daily.     aspirin 81 MG tablet Take 1 tablet (81 mg total) by mouth daily. 30 tablet 11   atorvastatin (LIPITOR) 40 MG tablet Take 1 tablet (40 mg total) by mouth daily. 90 tablet 0   Blood Glucose Monitoring Suppl (ONE TOUCH ULTRA 2) w/Device KIT 1 Device by Does not apply route once. 1 each 0   calcitRIOL (ROCALTROL) 0.25 MCG capsule Take 0.25 mcg by mouth 3 (three) times a week.     escitalopram (LEXAPRO) 10 MG tablet Take 10 mg by mouth daily.     gabapentin (NEURONTIN) 100 MG capsule Take 100 mg by mouth 3 (three) times daily.     glucose blood (ONE TOUCH ULTRA TEST) test strip Check sugar 3-4 x daily as needed 200 each 3   Insulin Glargine (LANTUS SOLOSTAR) 100 UNIT/ML Solostar Pen  INJECT 60 UNITS SUBCUTANEOUSLY ONCE DAILY (Patient taking differently: Inject 25 Units into the skin daily before breakfast.) 15 mL 0   Insulin Pen Needle (B-D UF III MINI PEN NEEDLES) 31G X 5 MM MISC Use with Solostar Lantus pen needles 100 each 5   methylcellulose (CITRUCEL) oral powder Take 1 packet by mouth daily.     metoprolol tartrate (LOPRESSOR) 25 MG tablet Take 12.5 mg by mouth 2 (two) times daily.     ONETOUCH DELICA LANCETS 41U MISC Test blood glucose 3-4 times daily as needed. ICD-10 code: E11.40. 100 each 5   ROCKLATAN 0.02-0.005 % SOLN Place 1 drop into both eyes at bedtime.     acetaminophen (TYLENOL) 500 MG tablet Take 500-1,000 mg by mouth every 6 (six) hours as needed for moderate pain or mild pain.     ammonium lactate (AMLACTIN) 12 % lotion Apply 1 application topically as needed for dry skin. 400 g 0   Semaglutide,0.25 or 0.5MG/DOS, 2 MG/1.5ML SOPN Inject 0.5 mg into the skin once a week.     Current Facility-Administered Medications  Medication Dose Route Frequency Provider Last Rate Last Admin   0.9 %  sodium chloride infusion  500 mL Intravenous Once Milus Banister, MD        Allergies as of 10/25/2021 -  Review Complete 10/25/2021  Allergen Reaction Noted   Metformin Diarrhea and Other (See Comments) 04/22/2010    Family History  Problem Relation Age of Onset   Heart disease Mother    Hyperlipidemia Mother    Diabetes Mother    Cancer Brother    Hyperlipidemia Brother    Diabetes Daughter    Diabetes Daughter    Colon cancer Neg Hx    Colon polyps Neg Hx    Rectal cancer Neg Hx    Stomach cancer Neg Hx    Esophageal cancer Neg Hx    Breast cancer Neg Hx     Social History   Socioeconomic History   Marital status: Divorced    Spouse name: Not on file   Number of children: 4   Years of education: 10   Highest education level: Not on file  Occupational History   Occupation: Retired  Tobacco Use   Smoking status: Former    Years: 30.00     Types: Cigarettes    Quit date: 10/09/2014    Years since quitting: 7.0   Smokeless tobacco: Never   Tobacco comments:    1 cig a week  Vaping Use   Vaping Use: Never used  Substance and Sexual Activity   Alcohol use: Not Currently    Alcohol/week: 1.0 standard drink    Types: 1 Cans of beer per week    Comment: occasional beer   Drug use: No   Sexual activity: Not on file  Other Topics Concern   Not on file  Social History Narrative   Health Care POA:    Emergency Contact: Jossette Zirbel (daughter) [phone]   End of Life Plan: Pt will bring copy of living will to next visit   Who lives with you: By herself   Any pets: 0   Diet: Pt reports recently reducing her intake of starches and carbohydrates. Pt reports eating more fresh fruits and vegetables to help reduce her A1C.     Exercise: Pt reports using the treadmill for 60 minutes, 2-3 times per week.    Seatbelts: Pt reports wearing a seatbelt when in vehicle.   Nancy Fetter Exposure/Protection: Pt denies wearing any protection but mentioned she will start due to side effects of her recently prescribed medication.    Hobbies: Reading and going to movies with family.   Social Determinants of Health   Financial Resource Strain: Not on file  Food Insecurity: Not on file  Transportation Needs: Not on file  Physical Activity: Not on file  Stress: Not on file  Social Connections: Not on file  Intimate Partner Violence: Not on file     Physical Exam: BP (!) 194/91    Pulse 63    Temp 98.8 F (37.1 C)    Ht '5\' 2"'  (1.575 m)    Wt 162 lb (73.5 kg)    SpO2 99%    BMI 29.63 kg/m  Constitutional: generally well-appearing Psychiatric: alert and oriented x3 Lungs: CTA bilaterally Heart: no MCR  Assessment and plan: 76 y.o. female with ph polyps  Surveillance colonoscoy today  Care is appropriate for the ambulatory setting.  Owens Loffler, MD [facility] 10/25/2021, 8:20 AM

## 2021-10-25 NOTE — Progress Notes (Signed)
0830 BP 229/104, Labetalol given IV, MD update, vss

## 2021-10-25 NOTE — Patient Instructions (Signed)
HANDOUTS PROVIDED ON: DIVERTICULOSIS & HEMORRHOIDS  You may resume your previous diet and medication schedule.  Thank you for allowing [name] to care for you today!!!   YOU HAD AN ENDOSCOPIC PROCEDURE TODAY AT [facility]:   Refer to the procedure report that was given to you for any specific questions about what was found during the examination.  If the procedure report does not answer your questions, please call your gastroenterologist to clarify.  If you requested that your care partner not be given the details of your procedure findings, then the procedure report has been included in a sealed envelope for you to review at your convenience later.  YOU SHOULD EXPECT: Some feelings of bloating in the abdomen. Passage of more gas than usual.  Walking can help get rid of the air that was put into your GI tract during the procedure and reduce the bloating. If you had a lower endoscopy (such as a colonoscopy or flexible sigmoidoscopy) you may notice spotting of blood in your stool or on the toilet paper. If you underwent a bowel prep for your procedure, you may not have a normal bowel movement for a few days.  Please Note:  You might notice some irritation and congestion in your nose or some drainage.  This is from the oxygen used during your procedure.  There is no need for concern and it should clear up in a day or so.  SYMPTOMS TO REPORT IMMEDIATELY:  Following lower endoscopy (colonoscopy or flexible sigmoidoscopy):  Excessive amounts of blood in the stool  Significant tenderness or worsening of abdominal pains  Swelling of the abdomen that is new, acute  Fever of 100F or higher  For urgent or emergent issues, a gastroenterologist can be reached at any hour by calling [phone]. Do not use MyChart messaging for urgent concerns.    DIET:  We do recommend a small meal at first, but then you may proceed to your regular diet.  Drink plenty of fluids but you should avoid  alcoholic beverages for 24 hours.  ACTIVITY:  You should plan to take it easy for the rest of today and you should NOT DRIVE or use heavy machinery until tomorrow (because of the sedation medicines used during the test).    FOLLOW UP: Our staff will call the number listed on your records Tuesday morning between 7:15 am and 8:15 am following your procedure to check on you and address any questions or concerns that you may have regarding the information given to you following your procedure. If we do not reach you, we will leave a message.  We will attempt to reach you two times.  During this call, we will ask if you have developed any symptoms of COVID 19. If you develop any symptoms (ie: fever, flu-like symptoms, shortness of breath, cough etc.) before then, please call [phone].  If you test positive for Covid 19 in the 2 weeks post procedure, please call and report this information to [name].    If any biopsies were taken you will be contacted by phone or by letter within the next 1-3 weeks.  Please call us at [phone] if you have not heard about the biopsies in 3 weeks.    SIGNATURES/CONFIDENTIALITY: You and/or your care partner have signed paperwork which will be entered into your electronic medical record.  These signatures attest to the fact that that the information above on your After Visit Summary has been reviewed and is understood.  Full responsibility of the confidentiality of this discharge information lies with you and/or your care-partner.

## 2021-10-25 NOTE — Progress Notes (Signed)
Report given to PACU, vss 

## 2021-10-25 NOTE — Progress Notes (Signed)
Mindray bp monitor cord has air leak and will not record bp.  Using a portable bp monitor in recovery bay 1 for this patient.

## 2021-10-25 NOTE — Op Note (Signed)
[facility] Patient Name: Anna [name] Procedure Date: 10/25/2021 8:16 AM MRN: [id number] Endoscopist: Milus Banister , MD Age: 76 Referring MD:  Date of Birth: 09-18-1946 Gender: Female Account #: [id number] Procedure:                Colonoscopy Indications:              High risk colon cancer surveillance: Personal                            history of colonic polyps; Colonoscopy 2009, single                            sessile serrated adenoma was removed. The                            colonoscopy November 2015 found 3 polyps, one was a                            tubular adenoma. She was recommended to have repeat                            surveillance colonoscopy at7year interval. Medicines:                Monitored Anesthesia Care Procedure:                Pre-Anesthesia Assessment:                           - Prior to the procedure, a History and Physical                            was performed, and patient medications and                            allergies were reviewed. The patient's tolerance of                            previous anesthesia was also reviewed. The risks                            and benefits of the procedure and the sedation                            options and risks were discussed with the patient.                            All questions were answered, and informed consent                            was obtained. Prior Anticoagulants: The patient has                            taken no previous anticoagulant or antiplatelet  agents. ASA Grade Assessment: III - A patient with                            severe systemic disease. After reviewing the risks                            and benefits, the patient was deemed in                            satisfactory condition to undergo the procedure.                           After obtaining informed consent, the colonoscope                            was passed  under direct vision. Throughout the                            procedure, the patient's blood pressure, pulse, and                            oxygen saturations were monitored continuously. The                            Olympus [id number]) Colonoscope was                            introduced through the anus and advanced to the the                            cecum, identified by appendiceal orifice and                            ileocecal valve. The colonoscopy was performed                            without difficulty. The patient tolerated the                            procedure well. The quality of the bowel                            preparation was good. The ileocecal valve,                            appendiceal orifice, and rectum were photographed. Scope In: 8:27:07 AM Scope Out: 8:38:11 AM Scope Withdrawal Time: 0 hours 7 minutes 8 seconds  Total Procedure Duration: 0 hours 11 minutes 4 seconds  Findings:                 Multiple small and large-mouthed diverticula were                            found in the entire colon.  External and internal hemorrhoids were found. The                            hemorrhoids were small.                           The exam was otherwise without abnormality on                            direct and retroflexion views. Complications:            No immediate complications. Estimated blood loss:                            None. Estimated Blood Loss:     Estimated blood loss: none. Impression:               - Diverticulosis in the entire examined colon.                           - External and internal hemorrhoids.                           - The examination was otherwise normal on direct                            and retroflexion views.                           - No specimens collected. Recommendation:           - Patient has a contact number available for                            emergencies. The signs and  symptoms of potential                            delayed complications were discussed with the                            patient. Return to normal activities tomorrow.                            Written discharge instructions were provided to the                            patient.                           - Resume previous diet.                           - Continue present medications.                           - No need for further colon cancer screening or  polyps surveillance. Milus Banister, MD 10/25/2021 8:41:06 AM This report has been signed electronically.

## 2021-10-29 ENCOUNTER — Telehealth: Payer: Self-pay

## 2021-10-29 NOTE — Telephone Encounter (Signed)
°  Follow up Call-  Call back number 10/25/2021  Post procedure Call Back phone  # [phone]  Permission to leave phone message Yes  Some recent data might be hidden     Patient questions:  Do you have a fever, pain , or abdominal swelling? No. Pain Score  0 *  Have you tolerated food without any problems? Yes.    Have you been able to return to your normal activities? Yes.    Do you have any questions about your discharge instructions: Diet   No. Medications  No. Follow up visit  No.  Do you have questions or concerns about your Care? No.  Actions: * If pain score is 4 or above: No action needed, pain <4.

## 2022-02-03 ENCOUNTER — Ambulatory Visit: Payer: Medicare Other | Admitting: Family Medicine

## 2022-02-04 ENCOUNTER — Other Ambulatory Visit: Payer: Self-pay | Admitting: Family

## 2022-02-04 DIAGNOSIS — Z1231 Encounter for screening mammogram for malignant neoplasm of breast: Secondary | ICD-10-CM

## 2022-02-24 IMAGING — CT CT HEAD W/O CM
4 series · 15 of 47 positions shown, 17 images · non-contrast
Comparison: 04/01/2019 brain MR, 11/20/2015 CT

CLINICAL DATA: 74-year-old female with acute altered mental status
and confusion.

EXAM:
CT HEAD WITHOUT CONTRAST
TECHNIQUE: Contiguous axial images were obtained from the base of the skull
through the vertex without intravenous contrast.

[Series 3: head without · axial · non-contrast · 0.42mm/px · z∈[-51,+64]mm · 7 of 31 slices shown, 9 images]
[im 4/31  brain]
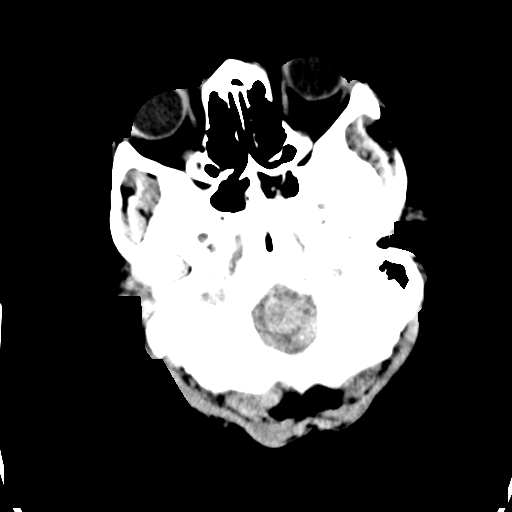
[im 4/31  bone]
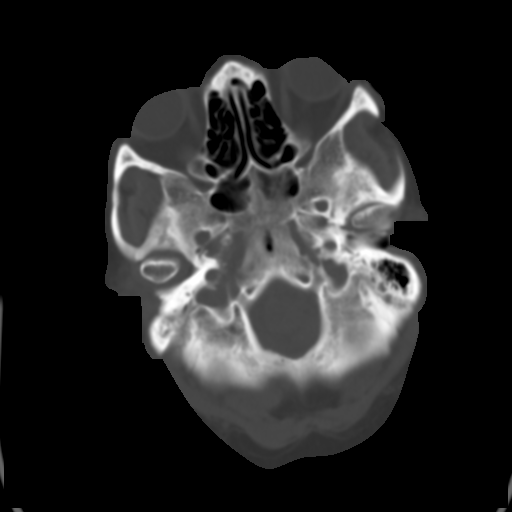
[im 8/31  brain]
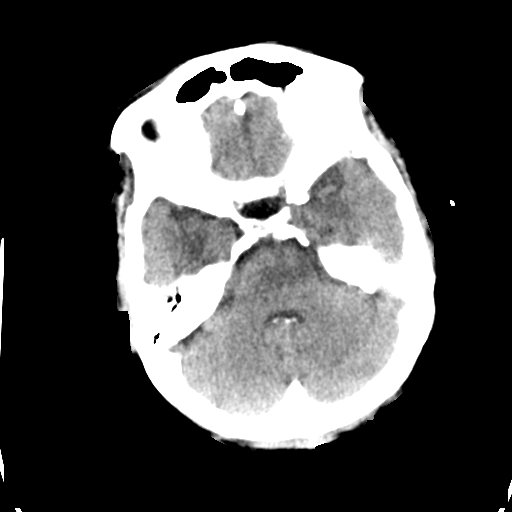
[im 12/31  brain]
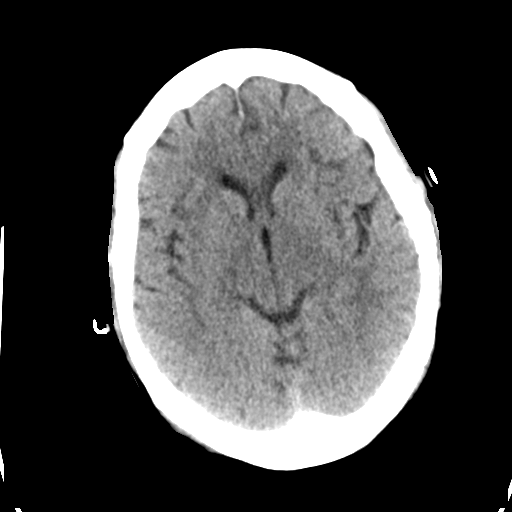
[im 16/31  brain]
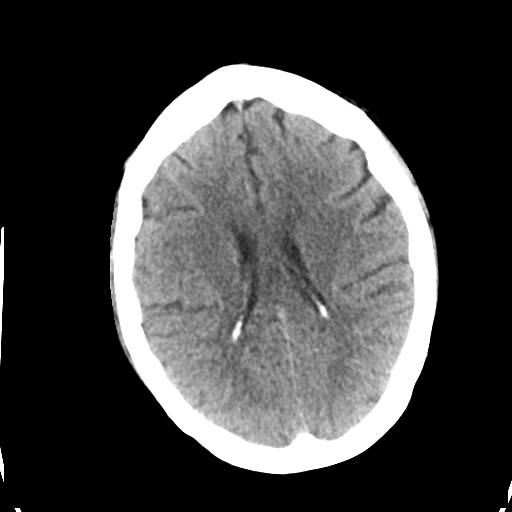
[im 19/31  brain]
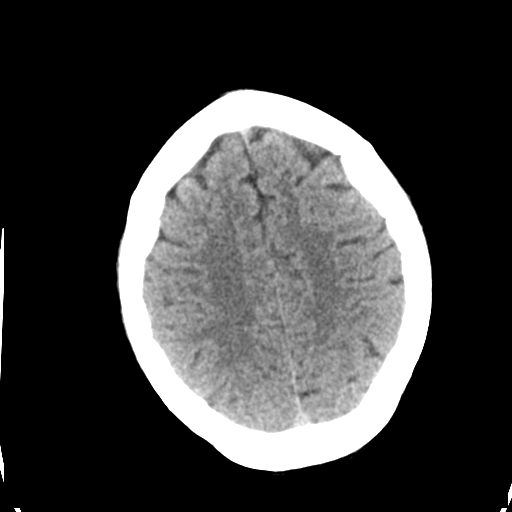
[im 19/31  bone]
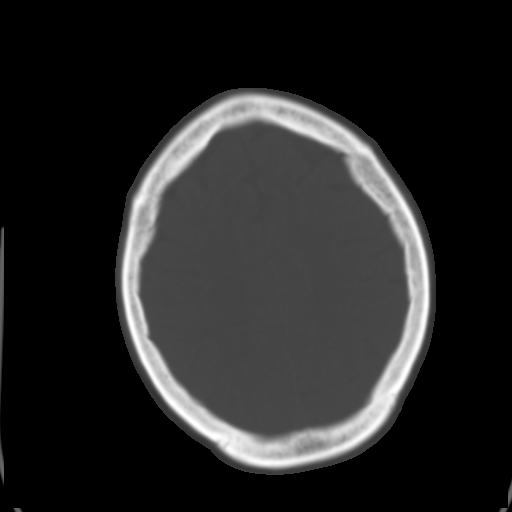
[im 23/31  brain]
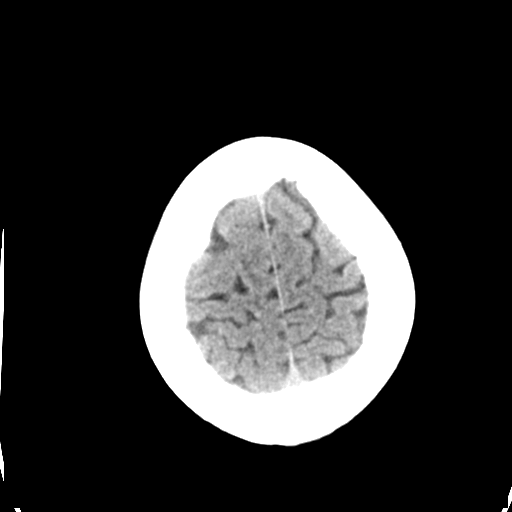
[im 27/31  brain]
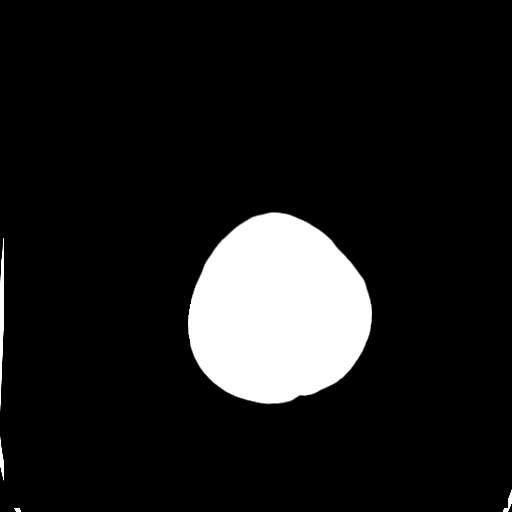

[Series 4: head bone · axial · 0.42mm/px · z∈[-52,-36]mm · 2 of 77 slices shown]
[im 8/77  bone]
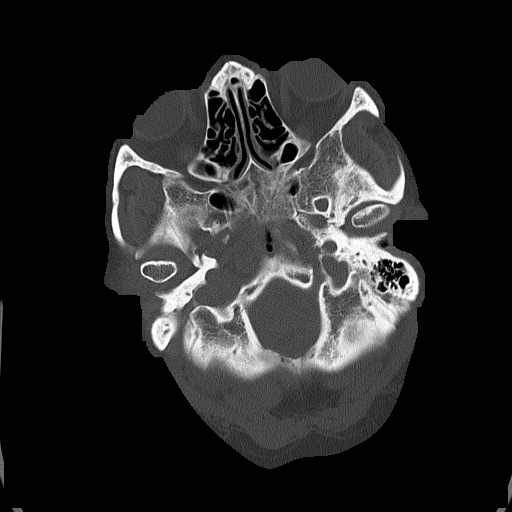
[im 16/77  bone]
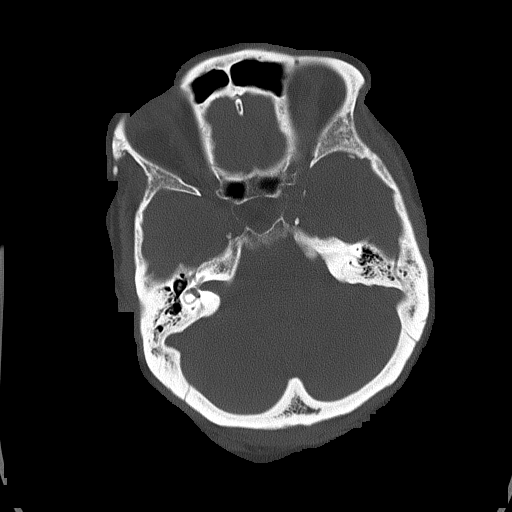

[Series 5: head without cor · coronal · non-contrast · 0.30mm/px · 3 of 67 slices shown]
[im 23/67  brain]
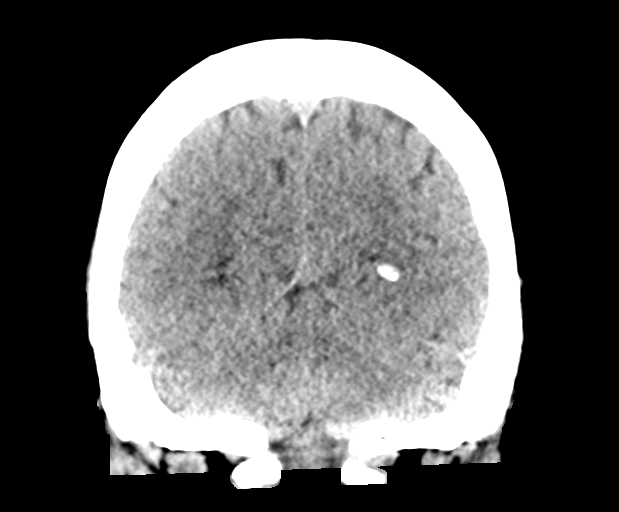
[im 30/67  brain]
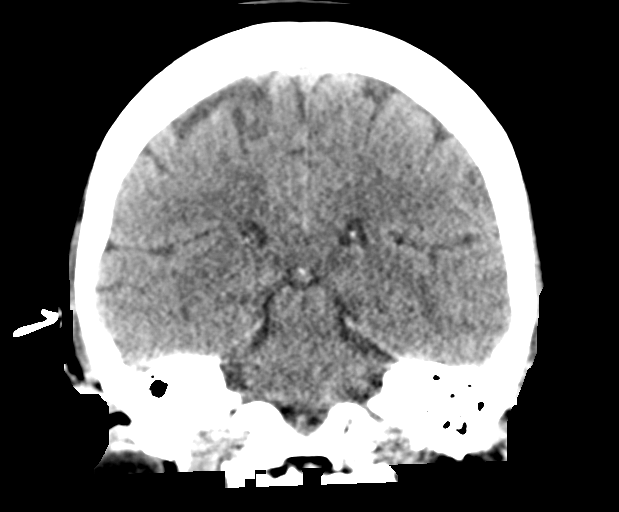
[im 37/67  brain]
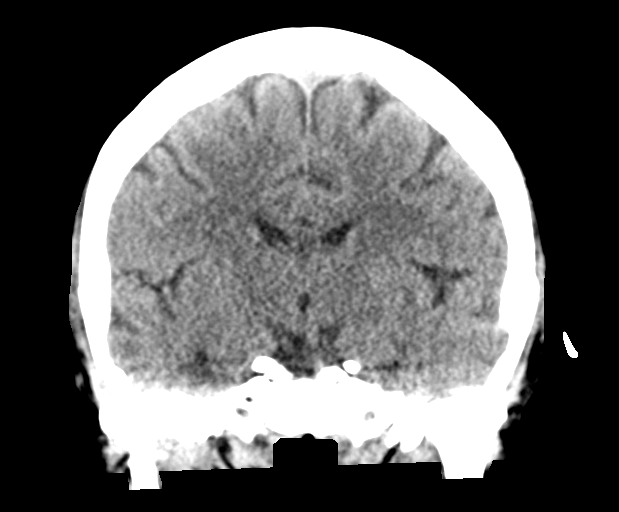

[Series 6: head without sag · sagittal · non-contrast · 0.30mm/px · 3 of 67 slices shown]
[im 23/67  brain]
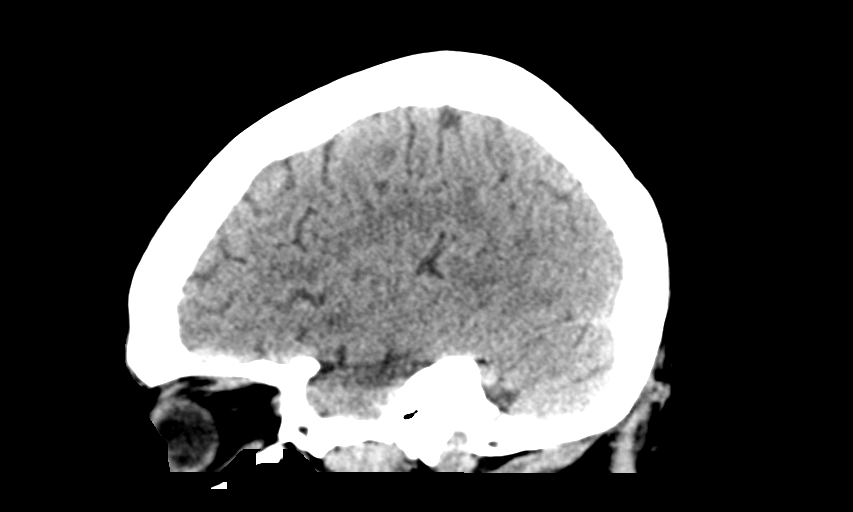
[im 34/67  brain]
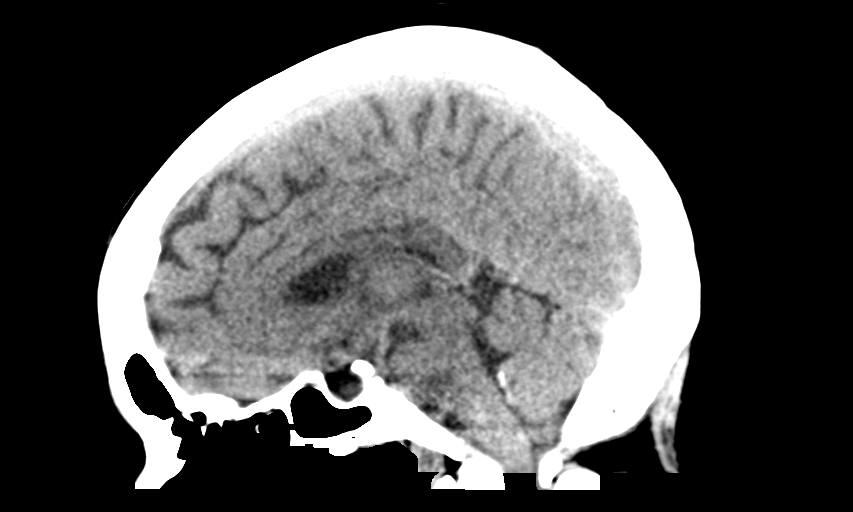
[im 45/67  brain]
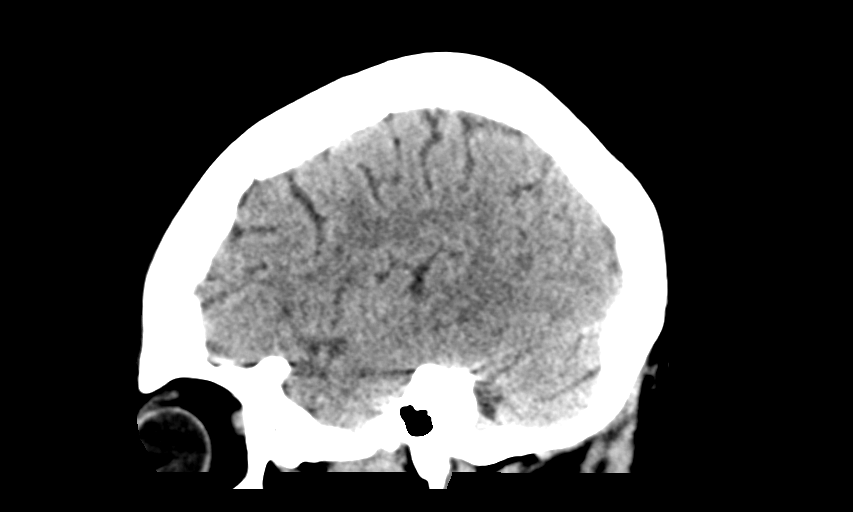

[15 of 47 positions shown; findings below may reference images not displayed]

FINDINGS: Brain: No evidence of acute infarction, hemorrhage, hydrocephalus,
extra-axial collection or mass lesion/mass effect.

Vascular: No hyperdense vessel or unexpected calcification.

Skull: Normal. Negative for fracture or focal lesion.

Sinuses/Orbits: No acute finding.

Other: None.
IMPRESSION: Unremarkable noncontrast head CT.

## 2022-03-11 ENCOUNTER — Ambulatory Visit
Admission: RE | Admit: 2022-03-11 | Discharge: 2022-03-11 | Disposition: A | Discharge status: Home / Self Care | Payer: Medicare Other | Source: Ambulatory Visit | Attending: Family | Admitting: Family

## 2022-03-11 DIAGNOSIS — Z1231 Encounter for screening mammogram for malignant neoplasm of breast: Secondary | ICD-10-CM

## 2023-01-27 ENCOUNTER — Encounter (HOSPITAL_COMMUNITY): Payer: Self-pay

## 2023-01-27 ENCOUNTER — Other Ambulatory Visit: Payer: Self-pay

## 2023-01-27 ENCOUNTER — Emergency Department (HOSPITAL_COMMUNITY)
Admission: EM | Admit: 2023-01-27 | Discharge: 2023-01-27 | Disposition: A | Discharge status: Home / Self Care | Payer: 59 | Attending: Emergency Medicine | Admitting: Emergency Medicine

## 2023-01-27 DIAGNOSIS — Z7982 Long term (current) use of aspirin: Secondary | ICD-10-CM | POA: Diagnosis not present

## 2023-01-27 DIAGNOSIS — Z794 Long term (current) use of insulin: Secondary | ICD-10-CM | POA: Insufficient documentation

## 2023-01-27 DIAGNOSIS — E876 Hypokalemia: Secondary | ICD-10-CM | POA: Insufficient documentation

## 2023-01-27 DIAGNOSIS — R197 Diarrhea, unspecified: Secondary | ICD-10-CM | POA: Diagnosis not present

## 2023-01-27 DIAGNOSIS — N183 Chronic kidney disease, stage 3 unspecified: Secondary | ICD-10-CM | POA: Insufficient documentation

## 2023-01-27 DIAGNOSIS — E1165 Type 2 diabetes mellitus with hyperglycemia: Secondary | ICD-10-CM | POA: Diagnosis not present

## 2023-01-27 DIAGNOSIS — I129 Hypertensive chronic kidney disease with stage 1 through stage 4 chronic kidney disease, or unspecified chronic kidney disease: Secondary | ICD-10-CM | POA: Insufficient documentation

## 2023-01-27 DIAGNOSIS — E1122 Type 2 diabetes mellitus with diabetic chronic kidney disease: Secondary | ICD-10-CM | POA: Insufficient documentation

## 2023-01-27 DIAGNOSIS — Z940 Kidney transplant status: Secondary | ICD-10-CM | POA: Insufficient documentation

## 2023-01-27 DIAGNOSIS — Z79899 Other long term (current) drug therapy: Secondary | ICD-10-CM | POA: Diagnosis not present

## 2023-01-27 DIAGNOSIS — R112 Nausea with vomiting, unspecified: Secondary | ICD-10-CM | POA: Diagnosis not present

## 2023-01-27 LAB — URINALYSIS, ROUTINE W REFLEX MICROSCOPIC
Bilirubin Urine: NEGATIVE
Glucose, UA: NEGATIVE mg/dL
Hgb urine dipstick: NEGATIVE
Ketones, ur: NEGATIVE mg/dL
Leukocytes,Ua: NEGATIVE
Nitrite: NEGATIVE
Protein, ur: 100 mg/dL — AB
Specific Gravity, Urine: 1.024 (ref 1.005–1.030)
pH: 5 (ref 5.0–8.0)

## 2023-01-27 LAB — COMPREHENSIVE METABOLIC PANEL
ALT: 21 U/L (ref 0–44)
AST: 21 U/L (ref 15–41)
Albumin: 4 g/dL (ref 3.5–5.0)
Alkaline Phosphatase: 67 U/L (ref 38–126)
Anion gap: 11 (ref 5–15)
BUN: 13 mg/dL (ref 8–23)
CO2: 27 mmol/L (ref 22–32)
Calcium: 9.1 mg/dL (ref 8.9–10.3)
Chloride: 99 mmol/L (ref 98–111)
Creatinine, Ser: 0.91 mg/dL (ref 0.44–1.00)
GFR, Estimated: >60 mL/min (ref >60)
Glucose, Bld: 118 mg/dL — ABNORMAL HIGH (ref 70–99)
Potassium: 3.1 mmol/L — ABNORMAL LOW (ref 3.5–5.1)
Sodium: 137 mmol/L (ref 135–145)
Total Bilirubin: 1 mg/dL (ref 0.3–1.2)
Total Protein: 7 g/dL (ref 6.5–8.1)

## 2023-01-27 LAB — CBC
HCT: 32.8 % — ABNORMAL LOW (ref 36.0–46.0)
Hemoglobin: 10 g/dL — ABNORMAL LOW (ref 12.0–15.0)
MCH: 26.4 pg (ref 26.0–34.0)
MCHC: 30.5 g/dL (ref 30.0–36.0)
MCV: 86.5 fL (ref 80.0–100.0)
Platelets: 279 10*3/uL (ref 150–400)
RBC: 3.79 MIL/uL — ABNORMAL LOW (ref 3.87–5.11)
RDW: 14.3 % (ref 11.5–15.5)
WBC: 7.2 10*3/uL (ref 4.0–10.5)
nRBC: 0 % (ref 0.0–0.2)

## 2023-01-27 LAB — CBG MONITORING, ED
Glucose-Capillary: 113 mg/dL — ABNORMAL HIGH (ref 70–99)
Glucose-Capillary: 114 mg/dL — ABNORMAL HIGH (ref 70–99)

## 2023-01-27 LAB — LIPASE, BLOOD: Lipase: 24 U/L (ref 11–51)

## 2023-01-27 MED ORDER — POTASSIUM CHLORIDE CRYS ER 20 MEQ PO TBCR: 40.0000 meq | EXTENDED_RELEASE_TABLET | Freq: Once | ORAL | Status: DC

## 2023-01-27 MED ORDER — SODIUM CHLORIDE 0.9 % IV BOLUS
1000.0000 mL | Freq: Once | INTRAVENOUS | Status: AC
  Administered 2023-01-27: 1000 mL via INTRAVENOUS

## 2023-01-27 MED ORDER — ONDANSETRON HCL 4 MG/2ML IJ SOLN
4.0000 mg | Freq: Once | INTRAMUSCULAR | Status: AC
  Administered 2023-01-27: 4 mg via INTRAVENOUS
  Filled 2023-01-27: qty 2

## 2023-01-27 MED ORDER — ONDANSETRON 4 MG PO TBDP: 4.0000 mg | ORAL_TABLET | Freq: Four times a day (QID) | ORAL | 0 refills | Status: AC | PRN

## 2023-01-27 MED ORDER — POTASSIUM CHLORIDE 20 MEQ PO PACK
40.0000 meq | PACK | Freq: Once | ORAL | Status: AC
  Administered 2023-01-27: 40 meq via ORAL
  Filled 2023-01-27: qty 2

## 2023-01-27 NOTE — ED Provider Notes (Signed)
[facility] EMERGENCY DEPARTMENT AT [facility] Provider Note   CSN: [id number] Arrival date & time: 01/27/23  1358     History  Chief Complaint  Patient presents with   Emesis    Anna [name] is a 77 y.o. female with medical history of CKD stage III s/p renal transplant on tacrolimus with last office visit 5/3.  Patient also has history of diabetes type 2, GERD, hyperlipidemia, hypertension, heart murmur.  Patient presents to ED for evaluation of nausea and vomiting and diarrhea.  Patient daughter with patient provides some history.  Per patient daughter, patient began having diarrhea, fatigue on Sunday.  Patient daughter reports that yesterday patient began having multiple episodes of nonbilious nonbloody vomiting.  The patient reports she has vomited most likely 12-13 times over the last 24 hours.  The patient denies any abdominal pain, fevers, chest pain, shortness of breath.  Patient reports her last A1c was 7, she is currently being seen by endocrinology.  She takes Lantus.  She reports she last took Lantus this morning.  Patient daughter also concerned because patient blood sugar has been dropping recently.  Patient daughter reports that the lowest patient blood sugar This morning was 69, highest was 113.  Patient reports she still makes urine, has good urine output.   Emesis Associated symptoms: diarrhea   Associated symptoms: no abdominal pain and no fever        Home Medications Prior to Admission medications   Medication Sig Start Date End Date Taking? Authorizing Provider  ondansetron (ZOFRAN-ODT) 4 MG disintegrating tablet Take 1 tablet (4 mg total) by mouth every 6 (six) hours as needed for nausea or vomiting. 01/27/23  Yes Al Decant, PA-C  acetaminophen (TYLENOL) 500 MG tablet Take 500-1,000 mg by mouth every 6 (six) hours as needed for moderate pain or mild pain.    [provider]  amLODipine (NORVASC) 10 MG tablet Take 10 mg by  mouth daily. 03/18/21   [provider]  ammonium lactate (AMLACTIN) 12 % lotion Apply 1 application topically as needed for dry skin. 09/18/21   Candelaria Stagers, DPM  aspirin 81 MG tablet Take 1 tablet (81 mg total) by mouth daily. 02/24/17   Diallo, Lilia Argue, MD  atorvastatin (LIPITOR) 40 MG tablet Take 1 tablet (40 mg total) by mouth daily. 09/30/17   Diallo, Lilia Argue, MD  Blood Glucose Monitoring Suppl (ONE TOUCH ULTRA 2) w/Device KIT 1 Device by Does not apply route once. 11/29/15   Jamal Collin, MD  calcitRIOL (ROCALTROL) 0.25 MCG capsule Take 0.25 mcg by mouth 3 (three) times a week.    [provider]  escitalopram (LEXAPRO) 10 MG tablet Take 10 mg by mouth daily.    [provider]  gabapentin (NEURONTIN) 100 MG capsule Take 100 mg by mouth 3 (three) times daily.    [provider]  glucose blood (ONE TOUCH ULTRA TEST) test strip Check sugar 3-4 x daily as needed 02/24/17   Diallo, Lilia Argue, MD  Insulin Glargine (LANTUS SOLOSTAR) 100 UNIT/ML Solostar Pen INJECT 60 UNITS SUBCUTANEOUSLY ONCE DAILY Patient taking differently: Inject 25 Units into the skin daily before breakfast. 02/10/19   Diallo, Abdoulaye, MD  Insulin Pen Needle (B-D UF III MINI PEN NEEDLES) 31G X 5 MM MISC Use with Solostar Lantus pen needles 02/24/17   Diallo, Abdoulaye, MD  methylcellulose (CITRUCEL) oral powder Take 1 packet by mouth daily. 07/19/21   Rachael Fee, MD  metoprolol tartrate (LOPRESSOR) 25 MG  tablet Take 12.5 mg by mouth 2 (two) times daily.    [provider]  [facility] DELICA LANCETS 33G MISC Test blood glucose 3-4 times daily as needed. ICD-10 code: E11.40. 02/25/17   Diallo, Lilia Argue, MD  ROCKLATAN 0.02-0.005 % SOLN Place 1 drop into both eyes at bedtime. 03/14/21   [provider]  Semaglutide,0.25 or 0.5MG /DOS, 2 MG/1.5ML SOPN Inject 0.5 mg into the skin once a week. 02/20/21   [provider]      Allergies    Metformin    Review of Systems    Review of Systems  Constitutional:  Negative for fever.  Respiratory:  Negative for shortness of breath.   Cardiovascular:  Negative for chest pain.  Gastrointestinal:  Positive for diarrhea, nausea and vomiting. Negative for abdominal pain and blood in stool.  All other systems reviewed and are negative.   Physical Exam Updated Vital Signs BP 129/76 (BP Location: Right Arm)   Pulse 77   Temp 98.4 F (36.9 C) (Oral)   Resp 18   Ht 5\' 2"  (1.575 m)   Wt 73.5 kg   SpO2 99%   BMI 29.64 kg/m  Physical Exam Vitals and nursing note reviewed.  Constitutional:      General: She is not in acute distress.    Appearance: She is well-developed.  HENT:     Head: Normocephalic and atraumatic.     Mouth/Throat:     Mouth: Mucous membranes are moist.  Eyes:     Extraocular Movements: Extraocular movements intact.     Conjunctiva/sclera: Conjunctivae normal.  Cardiovascular:     Rate and Rhythm: Normal rate and regular rhythm.     Heart sounds: No murmur heard. Pulmonary:     Effort: Pulmonary effort is normal. No respiratory distress.     Breath sounds: Normal breath sounds.  Abdominal:     Palpations: Abdomen is soft.     Tenderness: There is no abdominal tenderness.     Comments: No tenderness to palpation of abdomen, no rebound, no guarding  Musculoskeletal:        General: No swelling.     Cervical back: Neck supple.  Skin:    General: Skin is warm and dry.     Capillary Refill: Capillary refill takes less than 2 seconds.  Neurological:     Mental Status: She is alert and oriented to person, place, and time.  Psychiatric:        Mood and Affect: Mood normal.     ED Results / Procedures / Treatments   Labs (all labs ordered are listed, but only abnormal results are displayed) Labs Reviewed  COMPREHENSIVE METABOLIC PANEL - Abnormal; Notable for the following components:      Result Value   Potassium 3.1 (*)    Glucose, Bld 118 (*)    All other components within  normal limits  CBC - Abnormal; Notable for the following components:   RBC 3.79 (*)    Hemoglobin 10.0 (*)    HCT 32.8 (*)    All other components within normal limits  URINALYSIS, ROUTINE W REFLEX MICROSCOPIC - Abnormal; Notable for the following components:   Color, Urine AMBER (*)    APPearance CLOUDY (*)    Protein, ur 100 (*)    Bacteria, UA FEW (*)    All other components within normal limits  CBG MONITORING, ED - Abnormal; Notable for the following components:   Glucose-Capillary 113 (*)    All other components within normal limits  LIPASE, BLOOD    EKG None  Radiology No results found.  Procedures Procedures   Medications Ordered in ED Medications  ondansetron (ZOFRAN) injection 4 mg (4 mg Intravenous Given 01/27/23 1546)  sodium chloride 0.9 % bolus 1,000 mL (1,000 mLs Intravenous New Bag/Given 01/27/23 1546)  potassium chloride (KLOR-CON) packet 40 mEq (40 mEq Oral Given 01/27/23 1545)    ED Course/ Medical Decision Making/ A&P  Medical Decision Making Amount and/or Complexity of Data Reviewed Labs: ordered.  Risk Prescription drug management.   77 year old female presents to the ED for evaluation.  Please see HPI for further details.  On my examination the patient is afebrile and nontachycardic.  Her lung sounds are clear bilaterally, she is not hypoxic.  Her abdomen is soft and compressible throughout.  There is no tenderness elicited.  She is nontoxic in appearance.  Patient point-of-care CBG 113. CBC without leukocytosis, baseline hemoglobin. CMP with potassium 3.1 repleted with 40 mEq oral potassium, glucose 118, creatinine 0.91, no other electrolyte derangement noted, no anion gap elevation Urinalysis shows protein, few bacteria, patient denies any dysuria or flank pain Lipase WNL  Patient given 1 L fluid, 4 mg Zofran.  Patient reports he feels much better at this time.  At this time patient will be discharged home.  The patient will be advised to  follow-up with her endocrinologist for further management.  Patient and patient daughter advised to return to the ED with any new or worsening signs or symptoms such as nausea, vomiting, abdominal pain or fevers.  They both voiced understanding.  Patient will be sent home with Zofran which she will take every 6 hours as needed.  Patient stable to discharge.   Final Clinical Impression(s) / ED Diagnoses Final diagnoses:  Nausea vomiting and diarrhea    Rx / DC Orders ED Discharge Orders          Ordered    ondansetron (ZOFRAN-ODT) 4 MG disintegrating tablet  Every 6 hours PRN        01/27/23 1705              Al Decant, PA-C 01/27/23 1706    Linwood Dibbles, MD 01/28/23 1615

## 2023-01-27 NOTE — ED Triage Notes (Signed)
Pt c/o N/V started yesterday. Per pt's daughter blood glucose has been going low. The daughter states the lowest it has gone was 71 today at 64. Pt has vomited 8-9 times in the last 24 hrs.

## 2023-01-27 NOTE — ED Provider Triage Note (Signed)
Emergency Medicine Provider Triage Evaluation Note  Anna [name] , a 77 y.o. female  was evaluated in triage.  Pt complains of abnormal low blood sugar, nausea and vomiting for multiple episodes over the last several days. Patient is on lantus, humalog for diabetes, kidney transplant around 1 year ago. Last saw transplant doctors on Friday with no abnormalities at  that time. Patient reports she has mild appetite but amount she eats has not changed significantly any time recently.  Review of Systems  Positive: Nausea, vomiting, low blood sugar Negative: Abdominal pain  Physical Exam  BP 129/76 (BP Location: Right Arm)   Pulse 77   Temp 98.4 F (36.9 C) (Oral)   Resp 18   Ht 5\' 2"  (1.575 m)   Wt 73.5 kg   SpO2 99%   BMI 29.64 kg/m  Gen:   Awake, no distress   Resp:  Normal effort  MSK:   Moves extremities without difficulty Other:    Medical Decision Making  Medically screening exam initiated at 2:31 PM.  Appropriate orders placed.  Anna [name] was informed that the remainder of the evaluation will be completed by another provider, this initial triage assessment does not replace that evaluation, and the importance of remaining in the ED until their evaluation is complete.     Anna [name], [place] 01/27/23 1434

## 2023-01-27 NOTE — Discharge Instructions (Addendum)
Return to the ED with any new or worsening signs or symptoms such as nausea, vomiting, fevers or abdominal pain Please read the attached guide concerning nausea and vomiting in adults Please continue taking Zofran every 6 hours as needed for nausea Please continue hydrating yourself with electrolyte supplementation beverages such as Pedialyte Follow-up with your PCP for further management and reevaluation, lab check

## 2023-02-19 ENCOUNTER — Other Ambulatory Visit: Payer: Self-pay | Admitting: Family

## 2023-02-19 DIAGNOSIS — Z1231 Encounter for screening mammogram for malignant neoplasm of breast: Secondary | ICD-10-CM

## 2023-03-18 ENCOUNTER — Ambulatory Visit
Admission: RE | Admit: 2023-03-18 | Discharge: 2023-03-18 | Disposition: A | Discharge status: Home / Self Care | Payer: 59 | Source: Ambulatory Visit | Attending: Family | Admitting: Family

## 2023-03-18 DIAGNOSIS — Z1231 Encounter for screening mammogram for malignant neoplasm of breast: Secondary | ICD-10-CM

## 2023-09-02 NOTE — Progress Notes (Unsigned)
Established Dialysis Access   History of Present Illness   Anna [name] is a 77 y.o. 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 She has a functioning renal transplant. She has a right brachiocephalic fistula created by Dr. Randie Heinz in July of 2022. Today she reports that about a month ago she had some sharp shooting pains in her fistula but that has since subsided. She also has noticed some enlarging areas along the fistula. These are not painful but just made her concerned about what was going on.   Current Outpatient Medications  Medication Sig Dispense Refill   acetaminophen (TYLENOL) 500 MG tablet Take 500-1,000 mg by mouth every 6 (six) hours as needed for moderate pain or mild pain.     amLODipine (NORVASC) 10 MG tablet Take 10 mg by mouth daily.     ammonium lactate (AMLACTIN) 12 % lotion Apply 1 application topically as needed for dry skin. 400 g 0   aspirin 81 MG tablet Take 1 tablet (81 mg total) by mouth daily. 30 tablet 11   atorvastatin (LIPITOR) 40 MG tablet Take 1 tablet (40 mg total) by mouth daily. 90 tablet 0   Blood Glucose Monitoring Suppl (ONE TOUCH ULTRA 2) w/Device KIT 1 Device by Does not apply route once. 1 each 0   calcitRIOL (ROCALTROL) 0.25 MCG capsule Take 0.25 mcg by mouth 3 (three) times a week.     escitalopram (LEXAPRO) 10 MG tablet Take 10 mg by mouth daily.     gabapentin (NEURONTIN) 100 MG capsule Take 100 mg by mouth 3 (three) times daily.     glucose blood (ONE TOUCH ULTRA TEST) test strip Check sugar 3-4 x daily as needed 200 each 3   Insulin Glargine (LANTUS SOLOSTAR) 100 UNIT/ML Solostar Pen INJECT 60 UNITS SUBCUTANEOUSLY ONCE DAILY (Patient taking differently: Inject 25 Units into the skin daily before breakfast.) 15 mL 0   Insulin Pen Needle (B-D UF III MINI PEN NEEDLES) 31G X 5 MM MISC Use with Solostar Lantus pen needles 100 each 5   methylcellulose (CITRUCEL) oral powder Take 1 packet by mouth daily.      metoprolol tartrate (LOPRESSOR) 25 MG tablet Take 12.5 mg by mouth 2 (two) times daily.     mycophenolate (MYFORTIC) 180 MG EC tablet SMARTSIG:2.0 Tablet(s) By Mouth Twice Daily     ondansetron (ZOFRAN-ODT) 4 MG disintegrating tablet Take 1 tablet (4 mg total) by mouth every 6 (six) hours as needed for nausea or vomiting. 20 tablet 0   ONETOUCH DELICA LANCETS 33G MISC Test blood glucose 3-4 times daily as needed. ICD-10 code: E11.40. 100 each 5   predniSONE (DELTASONE) 5 MG tablet SMARTSIG:1.0 Tablet(s) By Mouth Daily     ROCKLATAN 0.02-0.005 % SOLN Place 1 drop into both eyes at bedtime.     Semaglutide,0.25 or 0.5MG /DOS, 2 MG/1.5ML SOPN Inject 0.5 mg into the skin once a week.     sulfamethoxazole-trimethoprim (BACTRIM) 400-80 MG tablet SMARTSIG:1.0 Tablet(s) By Mouth 3 Times a Week     tacrolimus (PROGRAF) 1 MG capsule Take by mouth.     No current facility-administered medications for this visit.    On ROS today: negative unless stated in HPI   Physical Examination   Vitals:   09/03/23 1011  BP: 122/72  Pulse: 73  Resp: 20  Temp: 97.7 F (36.5 C)  TempSrc: Temporal  SpO2: 98%  Weight: 127 lb 4.8 oz (57.7 kg)  Height: 5\' 2"  (1.575 m)  Body mass index is 23.28 kg/m.  General Alert and oriented  Pulmonary Non labored  Cardiac regular  Vascular 2+ right radial pulse, right hand warn and well perfused, right AV fistula is very pulsatile, there is an aneurysmal area at the [facility]. No thinning of the skin or hypopigmentation   Musculo- skeletal M/S 5/5 throughout  , Extremities without ischemic changes    Neurologic A&O; CN grossly intact      Medical Decision Making   Anna [name] is a 77 y.o. 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 She has a functioning renal transplant. She has a right brachiocephalic fistula created by Dr. Randie Heinz in July of 2022. She had recent episode of some pains in the fistula but this has resolved without  recurrence. She has noticed some aneurysmal areas of the fistula also. On exam it is very pulsatile but otherwise patent and functioning. I discussed with her that normally she would be indicated for a fistulogram but with a renal transplant would not recommend that as we have to use contrast. I also discussed that another option would be to ligate her fistula, however currently it is not causing her pain and her aneurysmal areas are not at risk for any skin breakdown. She is comfortable with plan to continue to monitor if for now. She understands that it is best to keep a functioning fistula if she can just incase anything were to happen with her transplant or need for dialysis. She will follow up as needed if she has any new or concerning symptoms.   Graceann Congress, PA-C [facility] Office: [phone]  Clinic MD: Karin Lieu

## 2023-09-03 ENCOUNTER — Ambulatory Visit (INDEPENDENT_AMBULATORY_CARE_PROVIDER_SITE_OTHER): Payer: 59 | Admitting: Physician Assistant

## 2023-09-03 VITALS — BP 122/72 | HR 73 | Temp 97.7°F | Resp 20 | Ht 62.0 in | Wt 127.3 lb

## 2023-09-03 DIAGNOSIS — N186 End stage renal disease: Secondary | ICD-10-CM | POA: Diagnosis not present

## 2023-09-03 DIAGNOSIS — Z940 Kidney transplant status: Secondary | ICD-10-CM | POA: Diagnosis not present

## 2023-11-24 ENCOUNTER — Other Ambulatory Visit: Payer: Self-pay | Admitting: Family

## 2023-11-24 DIAGNOSIS — M79602 Pain in left arm: Secondary | ICD-10-CM

## 2023-11-30 ENCOUNTER — Ambulatory Visit
Admission: RE | Admit: 2023-11-30 | Discharge: 2023-11-30 | Disposition: A | Discharge status: Home / Self Care | Source: Ambulatory Visit | Attending: Family | Admitting: Family

## 2023-11-30 DIAGNOSIS — M79602 Pain in left arm: Secondary | ICD-10-CM

## 2024-02-16 ENCOUNTER — Other Ambulatory Visit: Payer: Self-pay | Admitting: Family

## 2024-02-16 DIAGNOSIS — Z1231 Encounter for screening mammogram for malignant neoplasm of breast: Secondary | ICD-10-CM

## 2024-03-21 ENCOUNTER — Ambulatory Visit

## 2024-03-21 ENCOUNTER — Ambulatory Visit
Admission: RE | Admit: 2024-03-21 | Discharge: 2024-03-21 | Disposition: A | Discharge status: Home / Self Care | Source: Ambulatory Visit | Attending: Family | Admitting: Family

## 2024-03-21 DIAGNOSIS — Z1231 Encounter for screening mammogram for malignant neoplasm of breast: Secondary | ICD-10-CM

## 2024-03-30 NOTE — Progress Notes (Signed)
 Transplant Clinic Follow up Visit 03/30/2024  Patient summary: Anna [name] is a 78 y.o. 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 PMH otherwise significant for: HTN, OSA not on CPAP, hx tobacco use, depression, HLD, glaucoma, PVD. 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 PRA 14%. HLA MM 2-2-2. CMV +/+ . Donor was a 78 y.o. WM, BMI 19.4, KDPI 96%. COD: stroke. Simulect induction. Had IGF. Patient tolerated the procedure well and did well post-operatively. Foley catheter to remain on discharge. Transplant ureteral stents placed.  JP drains were continued.  Anticipated immunosuppression with FK goal 6-9, Myfortic 360 mg BID, prednisone  taper to 5mg  once FK therapeutic. Prophylaxis anticipated with diflucan for one month, Valcyte for three months, and Bactrim  at least twelve months post transplant.   Clinic HPI 03/30/2024: Anna [name] presents to post-transplant clinic today for an annual follow-up visit. She is 2 years out from DDRT. Overall, she is doing well and has no concerns today.  BPs have been stable at home. Endorses lightheadedness if she stands up too fast. Denies chest pain or palpitations. Follows with endocrinology for [facility] management.  Fluid intake is <64 oz a day, UOP normal and matches intake. Denies dysuria, hematuria, or incomplete emptying. Appetite is good with no nausea or vomiting. Patient is having regular BMs without diarrhea, constipation, or abdominal pain. Weight has been stable with no LE edema or SOB. No recent hospitalizations. Patient has been compliant with medications and has not missed any doses.   Health maintenance: Nephrology: [facility], q3 months PCP: Prentice Sharps, FNP Dental: has scheduled Dermatology: due    Recent skin cancer diagnosis: none Mammogram: UTD Pap smear: UTD, total hysterectomy Colonoscopy: UTD 2023 Flu shot: UTD PNA vaccine: UTD Shingles: UTD RSV: will give  today  Current IS:  Prograf- 5/5 mg BID (last dose last night at 9pm) Myfortic- 360 mg BID Prednisone - 5 mg daily  Prophylaxis: Currently taking Bactrim  for prophylaxis.   ROS: A complete ROS was performed and negative unless otherwise mentioned in HPI.  Wt Readings from Last 3 Encounters:  03/30/24 62 kg (136 lb 11.2 oz)  06/29/23 60.5 kg (133 lb 4.8 oz)  04/14/23 65.7 kg (144 lb 12.8 oz)   Patient Active Problem List   Diagnosis Date Noted   . Insulin  dependent type 2 diabetes mellitus (HCC) 06/20/2022  . Encounter for monitoring tacrolimus therapy 04/14/2022  . Uses self-applied continuous glucose monitoring device 04/14/2022  . Immunosuppression (HCC) 03/31/2022  . Kidney replaced by transplant 03/28/2022  . 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 Slowly improving.  - Recommend f/u with ophthalmology as scheduled - Working on better blood glucose control    . Arthritis of both knees 10/12/2021  . CKD (chronic kidney disease) stage 4, GFR 15-29 ml/min (HCC) 10/12/2021  . Diabetes mellitus due to underlying condition with kidney complication (HCC) 03/19/2021  . 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 Bilateral impacted cerumen 01/09/2017  . Diplopia 11/20/2015  . TIA (transient ischemic attack) 10/25/2014    Overview Note:    Formatting of this note might be different from the original. Questionable TIA vs related to symptoms due to Ultram   - On ASA for primary prevention Last Assessment & Plan:  Formatting of this note might be different from  the original. Patient with what sounds like a TIA with slurred speech resolving in 1.5  hours. Could potentially also be related to not taking her insulin  or to  accidentally taking tramadol . She has no neurological deficits at this  time and has not had recurrence of the  episode. Will obtain MRI brain to  further evaluate. Lipid panel and A1c for risk stratification. She is  already on aspirin . Consider increasing statin in the future. Given return  precautions.  Precepted with Dr Levonne.    SABRA HLD (hyperlipidemia) 06/12/2014    Overview Note:    Last Assessment & Plan:  Formatting of this note might be different from the original. Well controlled - continue current statin    . Depression 12/03/2012    Overview Note:    Last Assessment & Plan:  Formatting of this note might be different from the original. Using Zoloft  when needed - Two previous depressive episodes w/o SI or hospitalization - Discussed stopping this today, but she wanted to continue - Consider trial off at next visit and PHQ-9 for baseline    . Benign neoplasm of colon 01/21/2007    Overview Note:    Formatting of this note might be different from the original. 07/2014: adenomatous polyps. Recommend colonoscopy in 5 years. Call sooner  if rectal bleeding, abdominal pain or significant bowel habit changes  Marianne, Toribio SQUIBB, MD) 2009: sessile serrated adenoma removed.    . Benign essential hypertension 11/19/2006    Overview Note:    Formatting of this note might be different from the original. Qualifier: Diagnosis of  By: Gladis MD, [facility]  Last Assessment & Plan:  Formatting of this note might be different from the original. BP borderline today but has been well controlled previously - Continue current hypertension medications.  Follow-up at next visit in  2-3 months Formatting of this note might be different from the original. Formatting of this note might be different from the original. Qualifier: Diagnosis of  By: Gladis MD, Ronal  Last Assessment & Plan:  Formatting of this note might be different from the original. BP borderline today but has been well controlled previously - Continue current hypertension medications.  Follow-up at next visit in  2-3 months    .  Cataract 11/19/2006    Overview Note:    Formatting of this note might be different from the original. Followed by Dr [facility])    . Obesity, unspecified 11/19/2006    Overview Note:    Formatting of this note might be different from the original. Qualifier: Diagnosis of  By: Gladis MD, Ronal  Last Assessment & Plan:  Formatting of this note might be different from the original. Patient has gained a few lbs since last visit, which is likely  contributing to poor chronic medical disease control.  Discussed this as  well as lifestyle changes.      Resolved Problems  No resolved problems to display.   Allergies  Allergen Reactions  . Metformin Diarrhea, Other (See Comments) and GI Intolerance    REACTION: GI upset - Diarrhea, GI upset    Meds Ordered in [facility]  Medication Sig Dispense Refill  . acetaminophen  (TYLENOL ) 500 mg tablet Take by mouth every hour as needed.    SABRA alcohol swabs padm Apply 1 each topically 4 (four) times a day. 200 each 11  . amLODIPine  (NORVASC ) 5 mg tablet Take 1 tablet (5 mg total) by mouth daily. 90 tablet 1  . aspirin   81 mg EC tablet Take 81 mg by mouth Once Daily. 30 tablet 0  . atorvastatin  (LIPITOR) 40 mg tablet TAKE ONE TABLET BY MOUTH ONCE DAILY    . dorzolamide-timoloL (COSOPT) 22.3-6.8 mg/mL ophthalmic solution INSTILL 1 DROP INTO BOTH EYES 2 TIMES DAILY    . famotidine (PEPCID) 20 mg tablet 2 (two) times a day as needed.    . flash glucose sensor (FreeStyle Libre 2 Sensor) kit Inject 1 sensor to the skin every 14 days for continuous glucose monitoring. 2 kit 11  . gabapentin (NEURONTIN) 300 mg capsule Take 300 mg by mouth 2 (two) times a day.    . insulin  glargine (Lantus  Solostar U-100 Insulin ) 100 unit/mL (3 mL) pen Inject 20 Units under the skin in the morning. 15 mL 5  . lubiprostone (AMITIZA) 24 mcg capsule TAKE 1 CAPSULE BY MOUTH 2 TIMES DAILY WITH FOOD AND WATER    . metoprolol  tartrate (LOPRESSOR ) 25 mg tablet  Take 12.5 mg by mouth 2 (two) times a day.    . mycophenolate (MYFORTIC) 180 mg TbEC DR tablet 2 tablet by mouth two times daily 360 tablet 1  . ondansetron  (ZOFRAN -ODT) 4 mg disintegrating tablet Dissolve 4 mg on tongue every 6 (six) hours as needed.    . pantoprazole  (PROTONIX ) 40 mg EC tablet Take 40 mg by mouth nightly. 100 tablet 3  . pen needle, diabetic 32 gauge x 5/32 ndle Use to administer insulin  once daily 90 each 11  . polyethylene glycol (GLYCOLAX ) 17 gram packet Take 17 g by mouth Once Daily.    . predniSONE  (DELTASONE ) 5 mg tablet 1 tab by mouth daily 90 tablet 1  . Rocklatan 0.02-0.005 % drop place 1 drop IN [facility] EYE EVERY EVENING    . semaglutide (Ozempic) 1 mg/dose (4 mg/3 mL) pnij Inject 1 mg under the skin once a week. 3 mL 8  . amoxicillin (AMOXIL) 500 mg tablet Take 4 tablets (2,000 mg total) by mouth once for 1 dose. Take 30-60 minutes prior to dental procedure or cleaning. 1 tablet 3  . tacrolimus (PROGRAF) 1 mg capsule 5 capsule by mouth two times daily 300 capsule 5  . tacrolimus (PROGRAF) 1 mg capsule 4-5 capsule by mouth two times daily, 5 mg in the morning and 4 mg in the evening 270 capsule 11   No current Epic-ordered facility-administered medications on file.   Objective: Vitals:   03/30/24 0901 03/30/24 0903  BP: (!) 146/55 (!) 122/57  Pulse: 70 70  Temp: 97.5 F (36.4 C)   TempSrc: Temporal   SpO2: 100%   Weight: 62 kg (136 lb 11.2 oz)    Body mass index is 25 kg/m. Wt Readings from Last 3 Encounters:  03/30/24 62 kg (136 lb 11.2 oz)  06/29/23 60.5 kg (133 lb 4.8 oz)  04/14/23 65.7 kg (144 lb 12.8 oz)   Physical Exam: General appearance: 78 y.o. Black or African American female, NAD, WNWD, presents with family Head: Normocephalic, atraumatic Eyes: No icterus. EOMI. Throat: MMM, tongue normal; Oropharynx clear. Neck: No overt JVD or masses. Trachea midline Lungs: CTAB, good respiratory effort Heart: regular rate and rhythm, normal S1,  S2 Abdomen: soft, non-tender; bowel sounds active Allograft: right lower quadrant Renal allograft non-tender, no bruits, incision well healed Extremities: no edema, FROM of all extremities Skin: Normal turgor. No rashes or lesions Neurologic: No focal deficits. Gait stable. No tremor Psychiatric: Normal mood and affect. Their behavior is normal. Judgment and thought content normal.  Nursing note  and vitals reviewed.  Labs: Pending and will be reviewed later today. Will check FK506 levels for drug toxicity, cytopenias with immunosuppression, electrolyte management and following kidney transplant renal function for  monitoring of rejection or other renal disorders or technical complications.   Assessment: Anna [name] is a 78 y.o. African-American female with CKD 5 due to diabetes seen in followup of preemptive dual kidney transplant 03/26/2022.    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 Excellent renal function. Check CMP. Lab Results  Component Value Date   CREATININE 1.01 03/30/2024   CREATININE 0.93 03/20/2023   Immunosuppression: Patient status post Simulect induction. Continue current regimen of tacrolimus 5 mg BID, Mycophenolic acid 360 mg BID, and Prednisone  5 mg daily. Patient will remain on steroids (prednisone ) long term. Goal FK level: 5-7 (age).       Volume status: wt stable. No LE edema. Patient drinking <64 oz of water daily. Emphasized importance of fluid intake.  Wt Readings from Last 3 Encounters:  03/30/24 62 kg (136 lb 11.2 oz)  06/29/23 60.5 kg (133 lb 4.8 oz)  04/14/23 65.7 kg (144 lb 12.8 oz)     Post op Issues: Wound well healed. Stent removed. Right mid abdomen pain has resolved. Endorses occasional nausea without vomiting. Will send in script Zofran  PRN.     ID prophylaxis: Continue Bactrim  long term for PCP prophylaxis. - will discontinue today as patient is >1 year from transplant.    HTN:  Well controlled. Continue current regimen.    DM type II: Assessed as well controlled. CGM reviewed today with 85% time in goal range. Defer non-emergent management to Endocrinology. On Lantus  22 U nightly, Humalog 6-8-8 U TID with meals, and Ozempic 2 mg weekly.  Patient is able to self adjust insulin  and using a self adjusting sliding scale. Patient check BG four times a day. Patient is injecting insulin  four times a day. Hypoglycemia precautions discussed. Encouraged low carb/low sugar diet, portion control, and increased   physical activity as tolerated. Will continue to monitor.  Lab Results  Component Value Date   HGBA1C 6.2 06/22/2023   Insomnia: Patient has been taking Prednisone  5 mg at bedtime. Switch Prednisone  to AM. Also recommended melatonin. Resolved.   Anemia: improving on labs today. Iron and B12 levels are normal. No ESA given excellent renal fx. Will continue monitoring CBC.  Lab Results  Component Value Date   WBC 5.60 03/30/2024   HGB 10.2 (L) 03/30/2024   HCT 31.9 (L) 03/30/2024   MCV 83.6 03/30/2024   PLT 272 03/30/2024   Hyperlipidemia: Continue atorvastatin  40 mg. Will continue to monitor.  Lab Results  Component Value Date   CHOL 115 02/20/2023   CHOL 153 09/19/2022   CHOL 176 04/24/2022   Lab Results  Component Value Date   HDL 65 02/20/2023   HDL 86 09/19/2022   HDL 93 04/24/2022   Lab Results  Component Value Date   LDLCALC 30 02/20/2023   Lab Results  Component Value Date   TRIG 115 02/20/2023   TRIG 77 09/19/2022   TRIG 112 04/24/2022   Electrolytes: Normal K+, mag, phos and acid base status on labs, will monitor today. Hypomagnesemia: high Mg diet. Lab Results  Component Value Date   NA 142 03/30/2024   K 4.7 03/30/2024   CL 108 (H) 03/30/2024   CO2 28 03/30/2024   Lab Results  Component Value Date   MG 1.8 (L)  03/30/2024   Lab Results  Component Value Date   CALCIUM  9.1 03/30/2024   PHOS 4.4 03/30/2024   HCM: Patient  should remain up to date on routine healthcare maintenance including cancer screenings, colonoscopy, dermatology visits, and vaccinations. Dental prophylaxis and dermatology referral sent. Administered RSV vaccine in clinic today.  Follow up:1 year     Depending on patient preference, staffing availability and clinic volume on scheduled clinic day, the patient may be changed to an RCE (return care extender), Nurse Visit, or Lab only visit if deemed clinically stable.  I have personally spent 45 minutes involved in face-to-face and non-face-to-face activities for this patient on the day of the visit. Professional time spent includes the following activities, in addition to those noted in the documentation: chart review, lab review, patient education and counseling , medication counseling/reconciliation, and updating records in EMR.  Electronically signed by: Maurilio Ronnald Gull, PA-C 03/30/2024 10:45 AM

## 2024-03-30 NOTE — Progress Notes (Signed)
 TXP prograf 5/5 Patient reports last dose taken last night at 2100  Intake under 64 ounces of fluid intake daily UOP: WNL  S/S of urinary tract infection: no symptoms reported Medication review completed: changes made to med list  Concerns today: none      Anna [name], CMA

## 2024-04-11 NOTE — Progress Notes (Signed)
 Procedure Note - Removal of Cerumen Impaction, Bilateral:  Cc: 1-year return visit for routine ear cleaning.   Risks/benefits and alternatives were discussed with patient who understands and agrees to proceed.  DETAILS OF PROCEDURE: The patient was positioned and the ear was examined with the microscope. Obstructing cerumen was gently removed using suction from both ear canals (slightly narrowed bilaterally). Tms fully visualized, normal and intact, middle ears aerated. No signs of infection or cholesteatoma.  The patient tolerated this well.  No complications.  Follow up: annually or as needed with ENT.

## 2024-05-12 ENCOUNTER — Other Ambulatory Visit: Payer: Self-pay

## 2024-05-12 ENCOUNTER — Emergency Department (HOSPITAL_COMMUNITY)
Admission: EM | Admit: 2024-05-12 | Discharge: 2024-05-12 | Discharge status: Against Medical Advice | Attending: Emergency Medicine | Admitting: Emergency Medicine

## 2024-05-12 ENCOUNTER — Encounter (HOSPITAL_COMMUNITY): Payer: Self-pay

## 2024-05-12 ENCOUNTER — Emergency Department (HOSPITAL_COMMUNITY)

## 2024-05-12 DIAGNOSIS — S0990XA Unspecified injury of head, initial encounter: Secondary | ICD-10-CM | POA: Insufficient documentation

## 2024-05-12 DIAGNOSIS — I3139 Other pericardial effusion (noninflammatory): Secondary | ICD-10-CM | POA: Insufficient documentation

## 2024-05-12 DIAGNOSIS — Y92410 Unspecified street and highway as the place of occurrence of the external cause: Secondary | ICD-10-CM | POA: Diagnosis not present

## 2024-05-12 DIAGNOSIS — M25512 Pain in left shoulder: Secondary | ICD-10-CM | POA: Diagnosis not present

## 2024-05-12 DIAGNOSIS — M542 Cervicalgia: Secondary | ICD-10-CM | POA: Diagnosis not present

## 2024-05-12 DIAGNOSIS — Z794 Long term (current) use of insulin: Secondary | ICD-10-CM | POA: Insufficient documentation

## 2024-05-12 DIAGNOSIS — Z7982 Long term (current) use of aspirin: Secondary | ICD-10-CM | POA: Diagnosis not present

## 2024-05-12 LAB — COMPREHENSIVE METABOLIC PANEL WITH GFR
ALT: 10 U/L (ref 0–44)
AST: 14 U/L — ABNORMAL LOW (ref 15–41)
Albumin: 3.8 g/dL (ref 3.5–5.0)
Alkaline Phosphatase: 52 U/L (ref 38–126)
Anion gap: 10 (ref 5–15)
BUN: 16 mg/dL (ref 8–23)
CO2: 23 mmol/L (ref 22–32)
Calcium: 9.3 mg/dL (ref 8.9–10.3)
Chloride: 107 mmol/L (ref 98–111)
Creatinine, Ser: 0.94 mg/dL (ref 0.44–1.00)
GFR, Estimated: >60 mL/min (ref >60)
Glucose, Bld: 90 mg/dL (ref 70–99)
Potassium: 4.3 mmol/L (ref 3.5–5.1)
Sodium: 140 mmol/L (ref 135–145)
Total Bilirubin: 0.6 mg/dL (ref 0.0–1.2)
Total Protein: 6.3 g/dL — ABNORMAL LOW (ref 6.5–8.1)

## 2024-05-12 LAB — CBC WITH DIFFERENTIAL/PLATELET
Abs Immature Granulocytes: 0.01 K/uL (ref 0.00–0.07)
Basophils Absolute: 0 K/uL (ref 0.0–0.1)
Basophils Relative: 1 %
Eosinophils Absolute: 0.1 K/uL (ref 0.0–0.5)
Eosinophils Relative: 1 %
HCT: 31.6 % — ABNORMAL LOW (ref 36.0–46.0)
Hemoglobin: 9.7 g/dL — ABNORMAL LOW (ref 12.0–15.0)
Immature Granulocytes: 0 %
Lymphocytes Relative: 52 %
Lymphs Abs: 2.9 K/uL (ref 0.7–4.0)
MCH: 27 pg (ref 26.0–34.0)
MCHC: 30.7 g/dL (ref 30.0–36.0)
MCV: 88 fL (ref 80.0–100.0)
Monocytes Absolute: 0.4 K/uL (ref 0.1–1.0)
Monocytes Relative: 8 %
Neutro Abs: 2.1 K/uL (ref 1.7–7.7)
Neutrophils Relative %: 38 %
Platelets: 233 K/uL (ref 150–400)
RBC: 3.59 MIL/uL — ABNORMAL LOW (ref 3.87–5.11)
RDW: 14.4 % (ref 11.5–15.5)
WBC: 5.5 K/uL (ref 4.0–10.5)
nRBC: 0 % (ref 0.0–0.2)

## 2024-05-12 LAB — PHOSPHORUS: Phosphorus: 3.7 mg/dL (ref 2.5–4.6)

## 2024-05-12 LAB — MAGNESIUM: Magnesium: 1.8 mg/dL (ref 1.7–2.4)

## 2024-05-12 MED ORDER — ACETAMINOPHEN 500 MG PO TABS
1000.0000 mg | ORAL_TABLET | Freq: Once | ORAL | Status: AC
  Administered 2024-05-12: 1000 mg via ORAL
  Filled 2024-05-12: qty 2

## 2024-05-12 MED ORDER — HYDROCODONE-ACETAMINOPHEN 5-325 MG PO TABS
2.0000 | ORAL_TABLET | ORAL | 0 refills | Status: AC | PRN
Start: 2024-05-12 — End: ?

## 2024-05-12 NOTE — Discharge Instructions (Signed)
 Return for any problem.  ?
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
## 2024-05-14 LAB — TACROLIMUS LEVEL: Tacrolimus (FK506) - LabCorp: 4.7 ng/mL — ABNORMAL LOW (ref 5.0–20.0)

## 2024-05-24 ENCOUNTER — Other Ambulatory Visit (HOSPITAL_COMMUNITY): Payer: Self-pay | Admitting: Nurse Practitioner

## 2024-05-24 DIAGNOSIS — I3139 Other pericardial effusion (noninflammatory): Secondary | ICD-10-CM

## 2024-06-03 ENCOUNTER — Other Ambulatory Visit (INDEPENDENT_AMBULATORY_CARE_PROVIDER_SITE_OTHER)

## 2024-06-03 DIAGNOSIS — I3139 Other pericardial effusion (noninflammatory): Secondary | ICD-10-CM | POA: Diagnosis not present

## 2024-06-03 LAB — ECHOCARDIOGRAM COMPLETE
AR max vel: 2.03 cm2
AV Area VTI: 1.98 cm2
AV Area mean vel: 1.87 cm2
AV Mean grad: 5 mmHg
AV Peak grad: 9.5 mmHg
Ao pk vel: 1.54 m/s
Area-P 1/2: 2.29 cm2
S' Lateral: 2.5 cm

## 2024-06-10 ENCOUNTER — Other Ambulatory Visit: Payer: Self-pay | Admitting: Nurse Practitioner

## 2024-06-10 DIAGNOSIS — R911 Solitary pulmonary nodule: Secondary | ICD-10-CM

## 2024-06-17 ENCOUNTER — Ambulatory Visit
Admission: RE | Admit: 2024-06-17 | Discharge: 2024-06-17 | Disposition: A | Discharge status: Home / Self Care | Source: Ambulatory Visit | Attending: Nurse Practitioner | Admitting: Nurse Practitioner

## 2024-06-17 DIAGNOSIS — R911 Solitary pulmonary nodule: Secondary | ICD-10-CM

## 2024-06-23 ENCOUNTER — Other Ambulatory Visit: Payer: Self-pay | Admitting: Surgery

## 2024-06-23 DIAGNOSIS — M7542 Impingement syndrome of left shoulder: Secondary | ICD-10-CM

## 2024-06-23 DIAGNOSIS — S161XXA Strain of muscle, fascia and tendon at neck level, initial encounter: Secondary | ICD-10-CM

## 2024-07-08 ENCOUNTER — Ambulatory Visit
Admission: RE | Admit: 2024-07-08 | Discharge: 2024-07-08 | Disposition: A | Discharge status: Home / Self Care | Source: Ambulatory Visit | Attending: Surgery | Admitting: Surgery

## 2024-07-08 DIAGNOSIS — M7542 Impingement syndrome of left shoulder: Secondary | ICD-10-CM

## 2024-07-08 DIAGNOSIS — S161XXA Strain of muscle, fascia and tendon at neck level, initial encounter: Secondary | ICD-10-CM

## 2024-07-15 ENCOUNTER — Other Ambulatory Visit: Payer: Self-pay | Admitting: Surgery

## 2024-07-15 DIAGNOSIS — S161XXA Strain of muscle, fascia and tendon at neck level, initial encounter: Secondary | ICD-10-CM

## 2024-07-22 ENCOUNTER — Encounter: Payer: Self-pay | Admitting: *Deleted

## 2024-07-26 ENCOUNTER — Ambulatory Visit: Attending: Internal Medicine | Admitting: Internal Medicine

## 2024-07-26 ENCOUNTER — Encounter: Payer: Self-pay | Admitting: Internal Medicine

## 2024-08-30 ENCOUNTER — Encounter: Payer: Self-pay | Admitting: Cardiovascular Disease

## 2024-08-30 ENCOUNTER — Ambulatory Visit: Attending: Cardiovascular Disease | Admitting: Cardiovascular Disease

## 2024-08-30 VITALS — BP 130/60 | HR 75 | Ht 62.0 in | Wt 131.0 lb

## 2024-08-30 DIAGNOSIS — E782 Mixed hyperlipidemia: Secondary | ICD-10-CM

## 2024-08-30 DIAGNOSIS — I10 Essential (primary) hypertension: Secondary | ICD-10-CM

## 2024-08-30 DIAGNOSIS — I3139 Other pericardial effusion (noninflammatory): Secondary | ICD-10-CM | POA: Insufficient documentation

## 2024-08-30 DIAGNOSIS — Z87891 Personal history of nicotine dependence: Secondary | ICD-10-CM

## 2024-08-30 NOTE — Assessment & Plan Note (Signed)
 History of essential hypertension blood pressure measured today at 130/60.  She is on amlodipine , metoprolol .

## 2024-08-30 NOTE — Assessment & Plan Note (Signed)
 Patient had a small to moderate pericardial effusion by CT scan 05/12/2024.  Echocardiogram performed 06/03/2024 revealed a small pericardial effusion and follow-up CT scan performed 06/22/2024 showed trace pericardial effusion.  No further workup is required.
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
## 2024-08-30 NOTE — Patient Instructions (Signed)
 Medication Instructions:  Your physician recommends that you continue on your current medications as directed. Please refer to the Current Medication list given to you today.  *If you need a refill on your cardiac medications before your next appointment, please call your pharmacy*   Follow-Up: At [facility], you and your health needs are our priority.  As part of our continuing mission to provide you with exceptional heart care, our providers are all part of one team.  This team includes your primary Cardiologist (physician) and Advanced Practice Providers or APPs (Physician Assistants and Nurse Practitioners) who all work together to provide you with the care you need, when you need it.  Your next appointment:   We will see you on an as needed basis.  Provider:   Lauro Portal, MD   We recommend signing up for the patient portal called "MyChart".  Sign up information is provided on this After Visit Summary.  MyChart is used to connect with patients for Virtual Visits (Telemedicine).  Patients are able to view lab/test results, encounter notes, upcoming appointments, etc.  Non-urgent messages can be sent to your provider as well.   To learn more about what you can do with MyChart, go to [email or web address].

## 2024-08-30 NOTE — Assessment & Plan Note (Signed)
History of hyperlipidemia on statin therapy followed by her PCP. 

## 2024-08-30 NOTE — Assessment & Plan Note (Signed)
 History of tobacco abuse having quit January 2016 and smoked 50 pack years.
